# Patient Record
Sex: Male | Born: 1940
Health system: Southern US, Community
[De-identification: ages and names within clinical notes are randomized; demographics above are authoritative.]

## PROBLEM LIST (undated history)

## (undated) DIAGNOSIS — I10 Essential (primary) hypertension: Secondary | ICD-10-CM

## (undated) DIAGNOSIS — I671 Cerebral aneurysm, nonruptured: Secondary | ICD-10-CM

## (undated) DIAGNOSIS — I4891 Unspecified atrial fibrillation: Secondary | ICD-10-CM

## (undated) HISTORY — DX: Unspecified atrial fibrillation: I48.91

## (undated) HISTORY — PX: BRAIN SURGERY: SHX531

## (undated) HISTORY — PX: CARDIAC ELECTROPHYSIOLOGY MAPPING AND ABLATION: SHX1292

## (undated) HISTORY — DX: Essential (primary) hypertension: I10

## (undated) HISTORY — PX: CATARACT EXTRACTION W/ INTRAOCULAR LENS  IMPLANT, BILATERAL: SHX1307

## (undated) HISTORY — PX: CARDIOVERSION: SHX1299

## (undated) HISTORY — PX: LIVER BIOPSY: SHX301

## (undated) HISTORY — PX: TOE SURGERY: SHX1073

## (undated) SURGERY — Surgical Case
Anesthesia: *Unknown

---
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
## 2001-03-17 ENCOUNTER — Encounter (HOSPITAL_COMMUNITY): Admission: RE | Admit: 2001-03-17 | Discharge: 2001-06-15 | Payer: Self-pay | Admitting: Gastroenterology

## 2001-09-07 ENCOUNTER — Encounter (HOSPITAL_COMMUNITY): Admission: RE | Admit: 2001-09-07 | Discharge: 2001-12-06 | Payer: Self-pay | Admitting: Gastroenterology

## 2001-12-08 ENCOUNTER — Encounter (HOSPITAL_COMMUNITY): Admission: RE | Admit: 2001-12-08 | Discharge: 2002-03-08 | Payer: Self-pay | Admitting: Gastroenterology

## 2002-01-31 ENCOUNTER — Encounter: Payer: Self-pay | Admitting: Gastroenterology

## 2002-01-31 ENCOUNTER — Encounter: Admission: RE | Admit: 2002-01-31 | Discharge: 2002-01-31 | Payer: Self-pay | Admitting: Gastroenterology

## 2002-07-06 ENCOUNTER — Ambulatory Visit (HOSPITAL_COMMUNITY): Admission: RE | Admit: 2002-07-06 | Discharge: 2002-07-06 | Payer: Self-pay | Admitting: Gastroenterology

## 2002-10-25 ENCOUNTER — Ambulatory Visit (HOSPITAL_COMMUNITY): Admission: RE | Admit: 2002-10-25 | Discharge: 2002-10-25 | Payer: Self-pay | Admitting: Gastroenterology

## 2003-03-15 ENCOUNTER — Ambulatory Visit (HOSPITAL_COMMUNITY): Admission: RE | Admit: 2003-03-15 | Discharge: 2003-03-15 | Payer: Self-pay | Admitting: Gastroenterology

## 2003-07-19 ENCOUNTER — Encounter (HOSPITAL_COMMUNITY): Admission: RE | Admit: 2003-07-19 | Discharge: 2003-10-17 | Payer: Self-pay | Admitting: *Deleted

## 2004-01-31 ENCOUNTER — Encounter (HOSPITAL_COMMUNITY): Admission: RE | Admit: 2004-01-31 | Discharge: 2004-04-30 | Payer: Self-pay | Admitting: Gastroenterology

## 2004-04-05 HISTORY — PX: ANEURYSM COILING: SHX5349

## 2004-04-17 ENCOUNTER — Ambulatory Visit (HOSPITAL_COMMUNITY): Admission: RE | Admit: 2004-04-17 | Discharge: 2004-04-17 | Payer: Self-pay | Admitting: Cardiology

## 2004-05-28 ENCOUNTER — Ambulatory Visit (HOSPITAL_COMMUNITY): Admission: RE | Admit: 2004-05-28 | Discharge: 2004-05-28 | Payer: Self-pay | Admitting: Cardiology

## 2004-09-28 ENCOUNTER — Ambulatory Visit (HOSPITAL_COMMUNITY): Admission: RE | Admit: 2004-09-28 | Discharge: 2004-09-29 | Payer: Self-pay | Admitting: Gastroenterology

## 2004-12-30 ENCOUNTER — Ambulatory Visit (HOSPITAL_COMMUNITY): Admission: RE | Admit: 2004-12-30 | Discharge: 2004-12-30 | Payer: Self-pay | Admitting: Gastroenterology

## 2005-05-05 ENCOUNTER — Encounter: Admission: RE | Admit: 2005-05-05 | Discharge: 2005-05-05 | Payer: Self-pay | Admitting: Gastroenterology

## 2006-05-18 ENCOUNTER — Encounter: Admission: RE | Admit: 2006-05-18 | Discharge: 2006-05-18 | Payer: Self-pay | Admitting: Gastroenterology

## 2006-05-30 ENCOUNTER — Ambulatory Visit (HOSPITAL_COMMUNITY): Admission: RE | Admit: 2006-05-30 | Discharge: 2006-05-30 | Payer: Self-pay | Admitting: Cardiology

## 2006-07-22 ENCOUNTER — Ambulatory Visit (HOSPITAL_COMMUNITY): Admission: RE | Admit: 2006-07-22 | Discharge: 2006-07-22 | Payer: Self-pay | Admitting: Cardiology

## 2007-10-11 ENCOUNTER — Encounter: Admission: RE | Admit: 2007-10-11 | Discharge: 2007-10-11 | Payer: Self-pay | Admitting: Family Medicine

## 2008-03-14 ENCOUNTER — Ambulatory Visit (HOSPITAL_COMMUNITY): Admission: RE | Admit: 2008-03-14 | Discharge: 2008-03-14 | Payer: Self-pay | Admitting: Cardiology

## 2010-04-26 ENCOUNTER — Encounter: Payer: Self-pay | Admitting: Cardiology

## 2010-06-12 ENCOUNTER — Emergency Department (HOSPITAL_COMMUNITY): Payer: Medicare Other

## 2010-06-12 ENCOUNTER — Emergency Department (HOSPITAL_COMMUNITY)
Admission: EM | Admit: 2010-06-12 | Discharge: 2010-06-12 | Disposition: A | Discharge status: Home / Self Care | Payer: Medicare Other | Attending: Emergency Medicine | Admitting: Emergency Medicine

## 2010-06-12 DIAGNOSIS — R0609 Other forms of dyspnea: Secondary | ICD-10-CM | POA: Insufficient documentation

## 2010-06-12 DIAGNOSIS — I4891 Unspecified atrial fibrillation: Secondary | ICD-10-CM | POA: Insufficient documentation

## 2010-06-12 DIAGNOSIS — I10 Essential (primary) hypertension: Secondary | ICD-10-CM | POA: Insufficient documentation

## 2010-06-12 DIAGNOSIS — R6884 Jaw pain: Secondary | ICD-10-CM | POA: Insufficient documentation

## 2010-06-12 DIAGNOSIS — Z79899 Other long term (current) drug therapy: Secondary | ICD-10-CM | POA: Insufficient documentation

## 2010-06-12 DIAGNOSIS — R079 Chest pain, unspecified: Secondary | ICD-10-CM | POA: Insufficient documentation

## 2010-06-12 DIAGNOSIS — I499 Cardiac arrhythmia, unspecified: Secondary | ICD-10-CM | POA: Insufficient documentation

## 2010-06-12 DIAGNOSIS — M542 Cervicalgia: Secondary | ICD-10-CM | POA: Insufficient documentation

## 2010-06-12 DIAGNOSIS — R0989 Other specified symptoms and signs involving the circulatory and respiratory systems: Secondary | ICD-10-CM | POA: Insufficient documentation

## 2010-06-12 DIAGNOSIS — I2510 Atherosclerotic heart disease of native coronary artery without angina pectoris: Secondary | ICD-10-CM | POA: Insufficient documentation

## 2010-06-12 LAB — DIFFERENTIAL
Basophils Absolute: 0 10*3/uL (ref 0.0–0.1)
Basophils Relative: 0 % (ref 0–1)
Neutro Abs: 3.1 10*3/uL (ref 1.7–7.7)
Neutrophils Relative %: 61 % (ref 43–77)

## 2010-06-12 LAB — BASIC METABOLIC PANEL
Calcium: 9 mg/dL (ref 8.4–10.5)
GFR calc Af Amer: >60 mL/min (ref >60)
GFR calc non Af Amer: >60 mL/min (ref >60)
Glucose, Bld: 142 mg/dL — ABNORMAL HIGH (ref 70–99)
Sodium: 136 mEq/L (ref 135–145)

## 2010-06-12 LAB — CBC
Hemoglobin: 16.4 g/dL (ref 13.0–17.0)
RBC: 5.09 MIL/uL (ref 4.22–5.81)

## 2010-06-12 LAB — CK TOTAL AND CKMB (NOT AT ARMC)
CK, MB: 2.2 ng/mL (ref 0.3–4.0)
Total CK: 111 U/L (ref 7–232)

## 2010-06-12 LAB — APTT: aPTT: 38 seconds — ABNORMAL HIGH (ref 24–37)

## 2010-06-12 LAB — PROTIME-INR
INR: 1.21 (ref 0.00–1.49)
Prothrombin Time: 15.5 seconds — ABNORMAL HIGH (ref 11.6–15.2)

## 2010-07-06 ENCOUNTER — Other Ambulatory Visit: Payer: Self-pay | Admitting: Gastroenterology

## 2010-07-06 DIAGNOSIS — C220 Liver cell carcinoma: Secondary | ICD-10-CM

## 2010-07-14 ENCOUNTER — Ambulatory Visit
Admission: RE | Admit: 2010-07-14 | Discharge: 2010-07-14 | Disposition: A | Discharge status: Home / Self Care | Payer: Medicare Other | Source: Ambulatory Visit | Attending: Gastroenterology | Admitting: Gastroenterology

## 2010-07-14 DIAGNOSIS — C220 Liver cell carcinoma: Secondary | ICD-10-CM

## 2010-08-18 NOTE — Op Note (Signed)
NAME:  HOYTE, ZIEBELL NO.:  [id number]   MEDICAL RECORD NO.:  [id number]          PATIENT TYPE:  OIB   LOCATION:  2899                         FACILITY:  [facility]   PHYSICIAN:  Georga Hacking, M.D.DATE OF BIRTH:  [birth year]   DATE OF PROCEDURE:  DATE OF DISCHARGE:  03/14/2008                               OPERATIVE REPORT   INDICATIONS:  This is a 70 year old male with a history of atrial  tachycardia, which has been recurrent following previous radiofrequency  ablation for atrial fibrillation.   The patient was brought to the Outpatient Short-Stay Unit and had  anesthesia administered by Dr. Jairo Ben with 250 mg of  Pentothal.  Cardioversion was done with synchronized biphasic  defibrillation initially with 50 watts and then with 100 watts of  synchronized cardioversion resulting in reversion to sinus rhythm with a  second shock.   IMPRESSION:  Successful cardioversion of atrial tachycardia.      Georga Hacking, M.D.  Electronically Signed     WST/MEDQ  D:  03/14/2008  T:  03/14/2008  Job:  [id number]   cc:   Teena Irani. Sampson Goon, MD

## 2010-08-21 NOTE — Op Note (Signed)
NAME:  MERRILL, VILLARRUEL NO.:  [id number]   MEDICAL RECORD NO.:  [id number]          PATIENT TYPE:  OIB   LOCATION:  2899                         FACILITY:  [facility]   PHYSICIAN:  Georga Hacking, M.D.DATE OF BIRTH:  [birth year]   DATE OF PROCEDURE:  DATE OF DISCHARGE:  05/30/2006                               OPERATIVE REPORT   PROCEDURE:  Synchronized biphasic defibrillation.   INDICATION:  A 70 year old male with recurrent atrial flutter and  fibrillation.   DESCRIPTION OF PROCEDURE:  The patient was brought to the short stay  unit, was NPO.  After 200 mg of pentothal was administered by Dr. Bedelia Person under anesthetic monitoring, cardioversion was done with biphasic  synchronized defibrillation with 120 watts, resulting in conversion to  sinus rhythm.   IMPRESSION:  Successful cardioversion of atrial flutter and  fibrillation.      Georga Hacking, M.D.  Electronically Signed     WST/MEDQ  D:  05/30/2006  T:  05/31/2006  Job:  [id number]   cc:   Dellis Anes. Idell Pickles, M.D.

## 2010-08-21 NOTE — Op Note (Signed)
NAME:  ROGEN, PORTE NO.:  [id number]   MEDICAL RECORD NO.:  [id number]          PATIENT TYPE:  OIB   LOCATION:  2858                         FACILITY:  [facility]   PHYSICIAN:  Georga Hacking, M.D.DATE OF BIRTH:  Jul 01, 1940   DATE OF PROCEDURE:  07/22/2006  DATE OF DISCHARGE:                               OPERATIVE REPORT   PROCEDURE:  Cardioversion.   HISTORY:  The patient has recurrent atrial fibrillation.  He had a  successful cardioversion in February but reverted back to atrial  fibrillation a week afterwards.  His flecainide was increased and he is  brought in at this time for another attempt at cardioversion.   PROCEDURE:  DC cardioversion.  The patient was n.p.o. after midnight and  was brought to the short-stay center.  Anesthesia was administered by  Dr. Claybon Jabs with 300 mg of Pentothal intravenously.  Cardioversion  was done with synchronized biphasic defibrillation with 75 watts,  resulting in reversion to sinus rhythm after one shock.   IMPRESSION:  Successful cardioversion of atrial fibrillation.      Georga Hacking, M.D.  Electronically Signed     WST/MEDQ  D:  07/22/2006  T:  07/22/2006  Job:  [id number]

## 2010-12-11 ENCOUNTER — Other Ambulatory Visit: Payer: Self-pay | Admitting: Family Medicine

## 2010-12-11 DIAGNOSIS — Z136 Encounter for screening for cardiovascular disorders: Secondary | ICD-10-CM

## 2010-12-17 ENCOUNTER — Ambulatory Visit
Admission: RE | Admit: 2010-12-17 | Discharge: 2010-12-17 | Disposition: A | Discharge status: Home / Self Care | Payer: Medicare Other | Source: Ambulatory Visit | Attending: Family Medicine | Admitting: Family Medicine

## 2010-12-17 DIAGNOSIS — Z136 Encounter for screening for cardiovascular disorders: Secondary | ICD-10-CM

## 2011-02-12 ENCOUNTER — Other Ambulatory Visit: Payer: Self-pay | Admitting: Cardiology

## 2011-02-12 ENCOUNTER — Encounter: Payer: Self-pay | Admitting: Cardiology

## 2011-02-12 ENCOUNTER — Ambulatory Visit
Admission: RE | Admit: 2011-02-12 | Discharge: 2011-02-12 | Disposition: A | Discharge status: Home / Self Care | Payer: Medicare Other | Source: Ambulatory Visit | Attending: Cardiology | Admitting: Cardiology

## 2011-02-12 DIAGNOSIS — M109 Gout, unspecified: Secondary | ICD-10-CM | POA: Insufficient documentation

## 2011-02-12 DIAGNOSIS — I10 Essential (primary) hypertension: Secondary | ICD-10-CM | POA: Insufficient documentation

## 2011-02-12 DIAGNOSIS — Z7901 Long term (current) use of anticoagulants: Secondary | ICD-10-CM | POA: Insufficient documentation

## 2011-02-12 DIAGNOSIS — I4891 Unspecified atrial fibrillation: Secondary | ICD-10-CM

## 2011-02-12 DIAGNOSIS — I671 Cerebral aneurysm, nonruptured: Secondary | ICD-10-CM | POA: Insufficient documentation

## 2011-02-12 MED ORDER — HYDROCORTISONE 1 % EX CREA: 1.0000 "application " | TOPICAL_CREAM | Freq: Three times a day (TID) | CUTANEOUS | Status: DC | PRN

## 2011-02-12 MED ORDER — SODIUM CHLORIDE 0.45 % IV SOLN: INTRAVENOUS | Status: DC

## 2011-02-12 NOTE — H&P (Signed)
Randy Evans  Date of visit:  02/12/2011 DOB:  01-09-1941    Age:  70 yrs. Medical record number:  [id number]     Account number:  [id number] Primary Care Provider: Darrow Bussing ____________________________ CURRENT DIAGNOSES  1. Arrhythmia-Atrial Fibrillation  2. Hypertension-Essential (Benign)  3. Long-term (current) use of anticoagulants  4. Iron Overload Nos  5. Cerebral Aneurysm, Nonruptured ____________________________ ALLERGIES  codeine sulfate  Sulfonamides ____________________________ MEDICATIONS  1. Multi-Day tablet, 1 p.o. daily  2. Acetaminophen 500 mg Tablet, Take as directed  3. Chlor-Trimeton 4 mg Tablet, PRN  4. Ocuvite Tablet, 1 p.o. daily  5. Pradaxa 150 mg Capsule, BID  6. Calcium 600 600 mg (1,500 mg) Tablet, 1 p.o. daily  7. flecainide 150 mg Tablet, BID  8. allopurinol 300 mg Tablet, 1 p.o. q.d.  9. amlodipine 5 mg Tablet, 1 p.o. daily  10. Lanoxin 250 mcg Tablet, 1 p.o. daily  11. metoprolol tartrate 50 mg Tablet, 1 p.o. daily  12. losartan 50 mg Tablet, 1 p.o. daily ____________________________ CHIEF COMPLAINTS  Followup of Arrhythmia-Atrial Fibrillation ____________________________ HISTORY OF PRESENT ILLNESS  Patient seen for evaluation for cardioversion for atrial fibrillation. He has a long-standing history of paroxysmal atrial fibrillation and has had multiple cardioversions over the years. He had an atrial fibrillation ablation in October of 2009 and a redo ablation in April 2010 by Dr. Sampson Goon at [facility]. He had some recurrence afterwards and has been maintained on flecainide. He has been switched to Pradaxa for anticoagulation. His blood pressure is been well controlled. He recently noted some worsening fatigue and has had persistent atrial fibrillation now for about 3 weeks. He was seen in the office about 10 days ago and since the atrial fibrillation has persisted is brought in at this time to consider cardioversion. He denies angina  and has no PND, orthopnea or edema. He does complain of moderate fatigue and symptoms from the atrial fibrillation.  ____________________________ PAST HISTORY  Past Medical Illnesses:  hypertension, elevated liver enzymes with possible hemachromatosis, history of cerebral aneurysm treated with coil embolization at [facility], gout;  Cardiovascular Illnesses:  atrial fibrillation-paroxysmal;  Surgical Procedures:  liver biopsy, toe surgery, tumor left leg removed;  Cardiology Procedures-Invasive:  cardiac cath (left) May 1991, cardioversion February 2008, April 2008, Deceber 2009, [place] ablation for atrial fibrillation October 2009 and redo 07/2008;  Cardiology Procedures-Noninvasive:  echocardiogram January 2008, history of cardioversions, treadmill cardiolite April 2012;  Cardiac Cath Results:  normal LVSF, normal coronary arteries;  Peripheral Vascular Procedures:  embolization of cerebral artery 5/06 at [facility];  LVEF of 60% documented via nuclear study on 07/09/2010 ____________________________ CARDIO-PULMONARY TEST DATES EKG Date:  02/12/2011;   Cardiac Cath Date:  09/01/1999;  Nuclear Study Date:  07/09/2010;  is  Echocardiography Date: 04/22/2006;  Chest Xray Date: 05/30/2006;   ____________________________ SOCIAL HISTORY Alcohol Use:  drinks occasionally;  Smoking:  used to smoke but quit;  Diet:  [employer];  Lifestyle:  married;  Education:  college degree and [employer];  Exercise:  exercises regularly;  Occupation:  retired;  Residence:  lives with wife;  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  Cardiovascular:  please review HPI  Genitourinary-Male:  no dysuria, urgency, frequency, or nocturia  Musculoskeletal:  arthritis of the fingers  Neurological:  denies headaches, stroke, or  TIA ____________________________ PHYSICAL  EXAMINATION VITAL SIGNS  Blood Pressure:  110/70 Sitting, Right arm, regular cuff  , 114/70 Standing, Right arm and regular cuff   Pulse:  70/min. Weight:  193.00 lbs. Height:  69"BMI: 28  Constitutional:  pleasant white male in no acute distress Skin:  warm and dry to touch, no apparent skin lesions, or masses noted. Head:  normocephalic, balding male hair pattern ENT:  ears, nose and throat reveal no gross abnormalities.  No pallor or cyanosis.  Dentition good. Neck:  supple, no masses, thyromegaly, JVD. Carotid pulses are full and equal bilaterally without bruits. Chest:  normal symmetry, clear to auscultation and percussion. Cardiac:  irregular rhythm, normal S1 and S2, no S3 or S4, no murmurs,clicks, or rub heard Peripheral Pulses:  the femoral,dorsalis pedis, and posterior tibial pulses are full and equal bilaterally with no bruits auscultated. Extremities & Back:  no deformities, clubbing, cyanosis, erythema or edema observed. Normal muscle strength and tone. Neurological:  no motor or sensory deficits noted, affect appropriate, oriented x3. ____________________________ MOST RECENT LIPID PANEL 01/05/10  CHOL TOTL 164 mg/dl, LDL 086 Calc , HDL 28 mg/dl, TRIGLYCER 578 mg/dl, CHOL/HDL 4.69 (Calc ____________________________ IMPRESSIONS/PLAN  1. Persistent atrial fibrillation and atrial tachycardia which is symptomatic 2. Long-term anticoagulation with Pradaxa 3. Hypertension controlled  Recommendations:  Patient is brought in at this time for elective cardioversion. The procedure was discussed with him fully including risks and he is agreeable.  ____________________________ TODAYS ORDERS  1. 12 Lead EKG: Today  2. Comprehensive Metabolic Panel: Today  3. Complete Blood Count: Today  4. CHEST XRAY: Today                       ____________________________ Cardiology Physician:  Darden Palmer MD [facility]

## 2011-02-15 ENCOUNTER — Other Ambulatory Visit: Payer: Self-pay

## 2011-02-15 ENCOUNTER — Encounter (HOSPITAL_COMMUNITY): Payer: Self-pay

## 2011-02-15 ENCOUNTER — Ambulatory Visit (HOSPITAL_COMMUNITY): Payer: Medicare Other

## 2011-02-15 ENCOUNTER — Inpatient Hospital Stay (HOSPITAL_BASED_OUTPATIENT_CLINIC_OR_DEPARTMENT_OTHER): Admit: 2011-02-15 | Payer: Self-pay | Admitting: Cardiology

## 2011-02-15 ENCOUNTER — Ambulatory Visit (HOSPITAL_COMMUNITY)
Admission: RE | Admit: 2011-02-15 | Discharge: 2011-02-15 | Disposition: A | Discharge status: Home / Self Care | Payer: Medicare Other | Source: Ambulatory Visit | Attending: Cardiology | Admitting: Cardiology

## 2011-02-15 ENCOUNTER — Encounter (HOSPITAL_BASED_OUTPATIENT_CLINIC_OR_DEPARTMENT_OTHER): Payer: Self-pay

## 2011-02-15 ENCOUNTER — Encounter (HOSPITAL_COMMUNITY)
Admission: RE | Disposition: A | Discharge status: Home / Self Care | Payer: Self-pay | Source: Ambulatory Visit | Attending: Cardiology

## 2011-02-15 DIAGNOSIS — Z01810 Encounter for preprocedural cardiovascular examination: Secondary | ICD-10-CM | POA: Insufficient documentation

## 2011-02-15 DIAGNOSIS — I671 Cerebral aneurysm, nonruptured: Secondary | ICD-10-CM | POA: Insufficient documentation

## 2011-02-15 DIAGNOSIS — Z7901 Long term (current) use of anticoagulants: Secondary | ICD-10-CM | POA: Insufficient documentation

## 2011-02-15 DIAGNOSIS — I4891 Unspecified atrial fibrillation: Secondary | ICD-10-CM | POA: Insufficient documentation

## 2011-02-15 DIAGNOSIS — I10 Essential (primary) hypertension: Secondary | ICD-10-CM | POA: Insufficient documentation

## 2011-02-15 HISTORY — DX: Cerebral aneurysm, nonruptured: I67.1

## 2011-02-15 HISTORY — DX: Hemochromatosis, unspecified: E83.119

## 2011-02-15 SURGERY — JV LEFT HEART CATHETERIZATION WITH CORONARY ANGIOGRAM
Anesthesia: Moderate Sedation

## 2011-02-15 SURGERY — Surgical Case
Anesthesia: *Unknown

## 2011-02-15 SURGERY — CARDIOVERSION
Anesthesia: Monitor Anesthesia Care | Wound class: Clean

## 2011-02-15 MED ORDER — HYDROCORTISONE 1 % EX CREA
1.0000 "application " | TOPICAL_CREAM | Freq: Three times a day (TID) | CUTANEOUS | Status: DC | PRN
  Administered 2011-02-15: 1 via TOPICAL
  Filled 2011-02-15 (×2): qty 28

## 2011-02-15 MED ORDER — SODIUM CHLORIDE 0.45 % IV SOLN
INTRAVENOUS | Status: DC
  Administered 2011-02-15: 13:00:00 via INTRAVENOUS

## 2011-02-15 MED ORDER — DABIGATRAN ETEXILATE MESYLATE 150 MG PO CAPS: 150.0000 mg | ORAL_CAPSULE | Freq: Two times a day (BID) | ORAL | Status: DC

## 2011-02-15 MED ORDER — FLECAINIDE ACETATE 50 MG PO TABS: 150.0000 mg | ORAL_TABLET | Freq: Two times a day (BID) | ORAL | Status: DC

## 2011-02-15 MED ORDER — OCUVITE-LUTEIN PO CAPS: 1.0000 | ORAL_CAPSULE | Freq: Every day | ORAL | Status: DC

## 2011-02-15 MED ORDER — LOSARTAN POTASSIUM 50 MG PO TABS: 50.0000 mg | ORAL_TABLET | Freq: Every day | ORAL | Status: DC

## 2011-02-15 MED ORDER — CALCIUM CITRATE-VITAMIN D 200-200 MG-UNIT PO TABS: 1.0000 | ORAL_TABLET | Freq: Every day | ORAL | Status: DC

## 2011-02-15 MED ORDER — OMEGA-3-ACID ETHYL ESTERS 1 G PO CAPS: 1.0000 g | ORAL_CAPSULE | Freq: Two times a day (BID) | ORAL | Status: DC

## 2011-02-15 MED ORDER — PROPOFOL 10 MG/ML IV EMUL
INTRAVENOUS | Status: DC | PRN
  Administered 2011-02-15: 130 mg via INTRAVENOUS

## 2011-02-15 MED ORDER — SODIUM CHLORIDE 0.9 % IJ SOLN: 3.0000 mL | INTRAMUSCULAR | Status: DC | PRN

## 2011-02-15 MED ORDER — METOPROLOL TARTRATE 50 MG PO TABS: 50.0000 mg | ORAL_TABLET | Freq: Two times a day (BID) | ORAL | Status: DC

## 2011-02-15 MED ORDER — THERA M PLUS PO TABS: 1.0000 | ORAL_TABLET | Freq: Every day | ORAL | Status: DC

## 2011-02-15 MED ORDER — SODIUM CHLORIDE 0.9 % IV SOLN
INTRAVENOUS | Status: DC | PRN
  Administered 2011-02-15: 13:00:00 via INTRAVENOUS

## 2011-02-15 MED ORDER — ACETAMINOPHEN 500 MG PO TABS: 1000.0000 mg | ORAL_TABLET | Freq: Four times a day (QID) | ORAL | Status: DC | PRN

## 2011-02-15 MED ORDER — AMLODIPINE BESYLATE 5 MG PO TABS: 5.0000 mg | ORAL_TABLET | Freq: Every day | ORAL | Status: DC

## 2011-02-15 MED ORDER — ALLOPURINOL 300 MG PO TABS: 300.0000 mg | ORAL_TABLET | Freq: Every day | ORAL | Status: DC

## 2011-02-15 NOTE — Procedures (Signed)
Electrical Cardioversion Procedure Note JAYTEN GABBARD [id number] [birth year]  Procedure: Electrical Cardioversion Indications:  Recurrent atrial tachycardia  Time Out: Verified patient identification, verified procedure,medications/allergies/relevent history reviewed, required imaging and test results available.  Performed  Procedure Details  The patient was NPO after midnight. Anesthesia was administered at the beside  by Dr.Hodierne with 130 mg of propofol.  Cardioversion was done with synchronized biphasic defibrillation with AP pads with 100 watts and 120 watts.  The patient converted to normal sinus rhythm after the initial shock, but then reverted back to atrial tachycardia with block.  A second shock of 120 watts was then administered with transient conversion to sinus, but then reversion to atrial tachycardia. Since he failed to hold sinus, I elected to not attempt further cardioversions.  IMPRESSION:  Unsuccessful cardioversion of atrial tachycardia  PLAN:  Reconsult EP.  Consider change to a different agent ie Dofetilide or repeat ablation of a septal tachycardia.      Darden Palmer. MD [facility] 02/15/2011, 1:31 PM

## 2011-02-15 NOTE — H&P (Signed)
   Patient seen and examined.  No interval change in history and exam since last note 02/12/11.  Stable for procedure.  Darden Palmer. MD [facility]  02/15/2011

## 2011-02-15 NOTE — Anesthesia Preprocedure Evaluation (Addendum)
Anesthesia Evaluation  Patient identified by MRN, date of birth, ID band Patient awake    Reviewed: Allergy & Precautions, H&P , NPO status , Patient's Chart, lab work & pertinent test results, reviewed documented beta blocker date and time   Airway Mallampati: II TM Distance: >3 FB Neck ROM: full    Dental  (+) Teeth Intact   Pulmonary neg pulmonary ROS,          Cardiovascular hypertension, Atrial Fibrillation irregular     Neuro/Psych Negative Neurological ROS  Negative Psych ROS   GI/Hepatic negative GI ROS, Neg liver ROS,   Endo/Other  Negative Endocrine ROS  Renal/GU negative Renal ROS  Genitourinary negative   Musculoskeletal negative musculoskeletal ROS (+)   Abdominal   Peds  Hematology negative hematology ROS (+)   Anesthesia Other Findings Gout  Reproductive/Obstetrics                          Anesthesia Physical Anesthesia Plan  ASA: III  Anesthesia Plan: MAC   Post-op Pain Management:    Induction: Intravenous  Airway Management Planned: Mask  Additional Equipment:   Intra-op Plan:   Post-operative Plan:   Informed Consent: I have reviewed the patients History and Physical, chart, labs and discussed the procedure including the risks, benefits and alternatives for the proposed anesthesia with the patient or authorized representative who has indicated his/her understanding and acceptance.     Plan Discussed with: Anesthesiologist, CRNA and Surgeon  Anesthesia Plan Comments:        Anesthesia Quick Evaluation

## 2011-02-15 NOTE — Progress Notes (Signed)
113/68 B/P P 58 SAT 97

## 2011-02-15 NOTE — Preoperative (Signed)
Beta Blockers   Reason not to administer Beta Blockers:Not Applicable 

## 2011-02-15 NOTE — Brief Op Note (Signed)
02/15/2011  1:29 PM  PATIENT:  Randy Evans  70 y.o. male  PRE-OPERATIVE DIAGNOSIS:  afib  POST-OPERATIVE DIAGNOSIS: afib PROCEDURE:  Procedure(s): CARDIOVERSION done without complications.  Unable to convert back to sinus.  SURGEON:  Surgeon(s): Darden Palmer., MD

## 2011-02-15 NOTE — Progress Notes (Signed)
DR Donnie Aho NOTIFIED OF MONITOR SHOWING PAUSES OF ~3-4 SEC AND

## 2011-02-15 NOTE — Anesthesia Postprocedure Evaluation (Signed)
Anesthesia Post Note  Patient: Randy Evans  Procedure(s) Performed:  CARDIOVERSION  Anesthesia type: MAC  Patient location: PACU  Post pain: Pain level controlled and Adequate analgesia  Post assessment: Post-op Vital signs reviewed, Patient's Cardiovascular Status Stable, Respiratory Function Stable, Patent Airway and Pain level controlled  Last Vitals:  Filed Vitals:   02/15/11 1323  BP: 121/73  Pulse: 65  Temp:   Resp: 11    Post vital signs: Reviewed and stable  Level of consciousness: awake, alert  and oriented  Complications: No apparent anesthesia complications

## 2011-02-15 NOTE — Progress Notes (Signed)
112/75B/P P-60 SAT 98

## 2011-02-15 NOTE — Transfer of Care (Signed)
Immediate Anesthesia Transfer of Care Note  Patient: Randy Evans  Procedure(s) Performed:  CARDIOVERSION  Patient Location: PACU and Short Stay  Anesthesia Type: MAC  Level of Consciousness: awake, alert  and oriented  Airway & Oxygen Therapy: Patient connected to nasal cannula oxygen  Post-op Assessment: Report given to PACU RN  Post vital signs: Reviewed and stable  Complications: No apparent anesthesia complications

## 2011-02-18 ENCOUNTER — Encounter (HOSPITAL_COMMUNITY): Payer: Self-pay | Admitting: Cardiology

## 2011-02-19 ENCOUNTER — Encounter (HOSPITAL_COMMUNITY): Payer: Self-pay | Admitting: Cardiology

## 2011-03-16 ENCOUNTER — Other Ambulatory Visit: Payer: Self-pay

## 2011-03-16 ENCOUNTER — Inpatient Hospital Stay (HOSPITAL_COMMUNITY)
Admission: AD | Admit: 2011-03-16 | Discharge: 2011-03-19 | DRG: 310 | Disposition: A | Discharge status: Home / Self Care | Payer: Medicare Other | Source: Ambulatory Visit | Attending: Cardiology | Admitting: Cardiology

## 2011-03-16 ENCOUNTER — Encounter (HOSPITAL_COMMUNITY): Payer: Self-pay | Admitting: General Practice

## 2011-03-16 DIAGNOSIS — Z7901 Long term (current) use of anticoagulants: Secondary | ICD-10-CM

## 2011-03-16 DIAGNOSIS — I4891 Unspecified atrial fibrillation: Secondary | ICD-10-CM | POA: Diagnosis present

## 2011-03-16 DIAGNOSIS — I498 Other specified cardiac arrhythmias: Principal | ICD-10-CM | POA: Diagnosis present

## 2011-03-16 DIAGNOSIS — I119 Hypertensive heart disease without heart failure: Secondary | ICD-10-CM | POA: Diagnosis present

## 2011-03-16 DIAGNOSIS — E876 Hypokalemia: Secondary | ICD-10-CM | POA: Diagnosis present

## 2011-03-16 LAB — CBC
MCV: 93.8 fL (ref 78.0–100.0)
Platelets: 160 10*3/uL (ref 150–400)
RBC: 4.83 MIL/uL (ref 4.22–5.81)
WBC: 6.1 10*3/uL (ref 4.0–10.5)

## 2011-03-16 LAB — COMPREHENSIVE METABOLIC PANEL
Albumin: 3.8 g/dL (ref 3.5–5.2)
BUN: 18 mg/dL (ref 6–23)
Calcium: 8.7 mg/dL (ref 8.4–10.5)
Chloride: 103 mEq/L (ref 96–112)
Creatinine, Ser: 0.76 mg/dL (ref 0.50–1.35)
Total Bilirubin: 0.6 mg/dL (ref 0.3–1.2)

## 2011-03-16 LAB — DIFFERENTIAL
Eosinophils Relative: 2 % (ref 0–5)
Lymphocytes Relative: 29 % (ref 12–46)
Lymphs Abs: 1.8 10*3/uL (ref 0.7–4.0)
Neutrophils Relative %: 57 % (ref 43–77)

## 2011-03-16 MED ORDER — SODIUM CHLORIDE 0.9 % IV SOLN: 250.0000 mL | INTRAVENOUS | Status: DC | PRN

## 2011-03-16 MED ORDER — ALLOPURINOL 300 MG PO TABS
300.0000 mg | ORAL_TABLET | Freq: Every day | ORAL | Status: DC
  Administered 2011-03-17 – 2011-03-19 (×3): 300 mg via ORAL
  Filled 2011-03-16 (×3): qty 1

## 2011-03-16 MED ORDER — AMLODIPINE BESYLATE 5 MG PO TABS
5.0000 mg | ORAL_TABLET | Freq: Every day | ORAL | Status: DC
  Administered 2011-03-17 – 2011-03-19 (×3): 5 mg via ORAL
  Filled 2011-03-16 (×3): qty 1

## 2011-03-16 MED ORDER — DABIGATRAN ETEXILATE MESYLATE 150 MG PO CAPS
150.0000 mg | ORAL_CAPSULE | Freq: Two times a day (BID) | ORAL | Status: DC
  Administered 2011-03-17 – 2011-03-19 (×5): 150 mg via ORAL
  Filled 2011-03-16 (×7): qty 1

## 2011-03-16 MED ORDER — DOFETILIDE 500 MCG PO CAPS
500.0000 ug | ORAL_CAPSULE | Freq: Two times a day (BID) | ORAL | Status: DC
  Administered 2011-03-16 – 2011-03-17 (×2): 500 ug via ORAL
  Filled 2011-03-16 (×3): qty 1

## 2011-03-16 MED ORDER — THERA M PLUS PO TABS
1.0000 | ORAL_TABLET | Freq: Every day | ORAL | Status: DC
  Administered 2011-03-17 – 2011-03-19 (×3): 1 via ORAL
  Filled 2011-03-16 (×3): qty 1

## 2011-03-16 MED ORDER — DOFETILIDE 500 MCG PO CAPS
500.0000 ug | ORAL_CAPSULE | Freq: Two times a day (BID) | ORAL | Status: DC
  Filled 2011-03-16: qty 1

## 2011-03-16 MED ORDER — OMEGA-3-ACID ETHYL ESTERS 1 G PO CAPS
1.0000 g | ORAL_CAPSULE | Freq: Two times a day (BID) | ORAL | Status: DC
  Administered 2011-03-16 – 2011-03-19 (×6): 1 g via ORAL
  Filled 2011-03-16 (×7): qty 1

## 2011-03-16 MED ORDER — SODIUM CHLORIDE 0.9 % IJ SOLN: 3.0000 mL | INTRAMUSCULAR | Status: DC | PRN

## 2011-03-16 MED ORDER — OCUVITE-LUTEIN PO CAPS: 1.0000 | ORAL_CAPSULE | Freq: Every day | ORAL | Status: DC

## 2011-03-16 MED ORDER — METOPROLOL TARTRATE 50 MG PO TABS
50.0000 mg | ORAL_TABLET | Freq: Two times a day (BID) | ORAL | Status: DC
  Administered 2011-03-17: 50 mg via ORAL
  Filled 2011-03-16 (×5): qty 1

## 2011-03-16 MED ORDER — SODIUM CHLORIDE 0.9 % IJ SOLN
3.0000 mL | Freq: Two times a day (BID) | INTRAMUSCULAR | Status: DC
  Administered 2011-03-16 – 2011-03-19 (×6): 3 mL via INTRAVENOUS

## 2011-03-16 MED ORDER — PROSIGHT PO TABS
1.0000 | ORAL_TABLET | Freq: Every day | ORAL | Status: DC
  Administered 2011-03-17 – 2011-03-19 (×3): 1 via ORAL
  Filled 2011-03-16 (×4): qty 1

## 2011-03-16 MED ORDER — LOSARTAN POTASSIUM 50 MG PO TABS
50.0000 mg | ORAL_TABLET | Freq: Every day | ORAL | Status: DC
Start: 2011-03-17 — End: 2011-03-19
  Administered 2011-03-17 – 2011-03-19 (×3): 50 mg via ORAL
  Filled 2011-03-16 (×3): qty 1

## 2011-03-16 NOTE — Progress Notes (Signed)
Patient EKG reviewed.  QT is around .40 not .44   (confirmed with Dr. Johney Frame of EP)  The atrial tachycardia makes calculation difficult but is not prolonged.  OK to begin Tikosyn.  Mg and K are OK.   Darden Palmer MD

## 2011-03-16 NOTE — H&P (Signed)
Neysa Hotter  Date of visit:  12/112012 DOB:  March 23, 1941    Age:  70 yrs. Medical record number:  [id number]  Primary Care Provider: Darrow Bussing ____________________________ CURRENT DIAGNOSES  1. Arrhythmia-Atrial Fibrillation  2. Hypertension-Essential (Benign)  3. Long-term (current) use of anticoagulants  4. Iron Overload Nos  5. Cerebral Aneurysm, Nonruptured ____________________________ ALLERGIES  codeine sulfate  Sulfonamides ____________________________ MEDICATIONS  1. Multi-Day tablet, 1 p.o. daily  2. Acetaminophen 500 mg Tablet, Take as directed  3. Chlor-Trimeton 4 mg Tablet, PRN  4. Ocuvite Tablet, 1 p.o. daily  5. Pradaxa 150 mg Capsule, BID  6. Calcium 600 600 mg (1,500 mg) Tablet, 1 p.o. daily  7. losartan 50 mg Tablet, 1 p.o. daily  8. allopurinol 300 mg Tablet, 1 p.o. q.d.  9. amlodipine 5 mg Tablet, 1 p.o. daily  10. metoprolol tartrate 50 mg Tablet, 1 p.o. daily  ____________________________ HISTORY OF PRESENT ILLNESS  Patient admitted to the hospital for initiation of antiarrhythmic therapy. He has a long-standing history of atrial fibrillation and has had 2 catheter ablations, the last in April of 2010. At the time he had a septal tachycardia that was not able to be ablated and was cardioverted once. He had recurrence of fatigue  He underwent cardioversion and reverted back to an atrial septal tachycardia. This evidently had been seen previously when he underwent his ablation. He continues to feel fatigued. I spoke with Dr. Johney Frame about this and we discussed that may be the next step would be to wash off of flecainide and then to start Tikosyn in the hospital under observation. His last dose of flecainide was last Wednesday night and he has been off of flecainide now for 6 days. ___________________________ PAST HISTORY  Past Medical Illnesses:  hypertension, elevated liver enzymes with possible hemachromatosis, history of cerebral aneurysm treated with  coil embolization at [facility], gout;  Cardiovascular Illnesses:  atrial fibrillation-paroxysmal;  Surgical Procedures:  liver biopsy, toe surgery, tumor left leg removed;  Cardiology Procedures-Invasive:  cardiac cath (left) May 1991, cardioversion February 2008, April 2008, Deceber 2009, [place] ablation for atrial fibrillation October 2009 and redo 07/2008, cardioversion November 2012;  Cardiology Procedures-Noninvasive:  echocardiogram January 2008, history of cardioversions, treadmill cardiolite April 2012, echocardiogram November 2012;  Cardiac Cath Results:  normal LVSF, normal coronary arteries;  Peripheral Vascular Procedures:  embolization of cerebral artery 5/06 at [facility];  LVEF of 60% documented via nuclear study on 07/09/2010 ____________________________ CARDIO-PULMONARY TEST DATES EKG Date:  02/22/2011;   Cardiac Cath Date:  09/01/1999;  Nuclear Study Date:  07/09/2010;  Echocardiography Date: 03/03/2011;  Chest Xray Date: 02/12/2011;   ____________________________ SOCIAL HISTORY Alcohol Use:  drinks occasionally;  Smoking:  used to smoke but quit;  Diet:  [employer];  Lifestyle:  married;  Education:  college degree and [employer];  Exercise:  exercises regularly;  Occupation:  retired;  Residence:  lives with wife;  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[place];   ____________________________ REVIEW OF SYSTEMS General:  malaise and fatigue Eyes:  wears eye glasses/contact lenses, cataract extraction bilaterally  Respiratory:  recent upper respiratory infection  Cardiovascular:  please review HPI  Abdominal:  negativeGenitourinary-Male:  no dysuria, urgency, frequency, or nocturia  Musculoskeletal:  arthritis of the fingers  Neurological:  denies headaches, stroke, or TIA ____________________________ PHYSICAL EXAMINATION VITAL SIGNS  Blood Pressure:  110/80 Sitting, Left arm, regular cuff  , 110/84 Standing, Left arm and regular cuff   Pulse:  88/min. Weight:  195 lbs. Height:  69"BMI:  28.79  Constitutional:  pleasant white male in no acute distress Skin:  warm and dry to touch, no apparent skin lesions, or masses noted. Head:  normocephalic, balding male hair pattern ENT:  ears, nose and throat reveal no gross abnormalities.  No pallor or cyanosis.  Dentition good. Neck:  supple, no masses, thyromegaly, JVD. Carotid pulses are full and equal bilaterally without bruits. Chest:  normal symmetry, clear to auscultation and percussion. Cardiac:  irregular rhythm, normal S1 and S2, no S3 or S4, no murmurs,clicks, or rub heard Peripheral Pulses:  the femoral,dorsalis pedis, and posterior tibial pulses are full and equal bilaterally with no bruits auscultated. Extremities & Back:  no deformities, clubbing, cyanosis, erythema or edema observed. Normal muscle strength and tone. Neurological:  no motor or sensory deficits noted, affect appropriate, oriented x3. ____________________________ MOST RECENT LIPID PANEL 01/05/10  CHOL TOTL 164 mg/dl, LDL 161 NM, HDL 28 mg/dl, TRIGLYCER 096 mg/dl, ALT 69 u/l, ALK PHOS 89 u/l, CHOL/HDL 5.95 (Calc) and AST 55 u/l ____________________________ IMPRESSIONS/PLAN  1. Recurrent atrial tachycardia that has been resistant to ablation 2. Hypertension controlled 3. Long-term anticoagulation with Pradaxa 4. History of hemachromatosis 5. History of cerebral aneurysm treated with coil ablation  Recommendations:  He will be initiated on Tikosyn after his laboratory studies have been obtained.  ____________________________ Cardiology Physician:  Darden Palmer MD [facility]

## 2011-03-17 ENCOUNTER — Other Ambulatory Visit: Payer: Self-pay

## 2011-03-17 LAB — BASIC METABOLIC PANEL
CO2: 23 mEq/L (ref 19–32)
Calcium: 8.5 mg/dL (ref 8.4–10.5)
Chloride: 102 mEq/L (ref 96–112)
Glucose, Bld: 110 mg/dL — ABNORMAL HIGH (ref 70–99)
Potassium: 3.8 mEq/L (ref 3.5–5.1)
Sodium: 138 mEq/L (ref 135–145)

## 2011-03-17 MED ORDER — DOFETILIDE 500 MCG PO CAPS
500.0000 ug | ORAL_CAPSULE | ORAL | Status: DC
  Administered 2011-03-17 – 2011-03-19 (×4): 500 ug via ORAL
  Filled 2011-03-17 (×6): qty 1

## 2011-03-17 MED ORDER — POTASSIUM CHLORIDE 20 MEQ/15ML (10%) PO LIQD
40.0000 meq | Freq: Once | ORAL | Status: AC
  Administered 2011-03-17: 40 meq via ORAL
  Filled 2011-03-17: qty 30

## 2011-03-17 NOTE — Progress Notes (Signed)
Pt admitted in  with afib for tikosyn load. Plan for home on tikosyn. CM did do a benefits check for pt and  will relay information to pt when available. MD please write  rx for 7 day supply of Tikosyn no refills and original rx with refills. Thanks. CM did speak to pt and he uses [facility] [address]. CM did call the pharmacy and the pharmacy tech will call CM to make sure that they can order. Benefits check in process.  RN @ d/c please call CM for assistance with getting the 7 day Rx filled by main pharmacy. Will continue to monitor. Thanks Gala Lewandowsky, [place] [phone].

## 2011-03-17 NOTE — Progress Notes (Signed)
CM did receive benefits check for Tikosyn: PATIENT HAS A $0 CO-PAY UNTIL 04/06/2011. AFTER 04/06/2011 PATIENT WILL HAVE APPROX. $71.96 CO-PAY AT RETAIL PHARMACY FOR 30 DAY SUPPLY. PATIENT CAN USE ANY MAJOR RETAIL PHARMACY. MAIL ORDER FOR 90 DAY SUPPLY IS APPROX. $80.00.Graves-Bigelow, Keanen Dohse Kaye,RN,BSN,CM [phone].    

## 2011-03-17 NOTE — Progress Notes (Signed)
Subjective:  He is feeling well and tolerated Tikosyn well. He converted to sinus rhythm last night at 11:30 and feels well today. Denies shortness of breath or chest pain.  Objective:  Vital Signs in the last 24 hours: BP 153/83  Pulse 67  Temp(Src) 97.6 F (36.4 C) (Oral)  Resp 18  Ht 5\' 10"  (1.778 m)  Wt 87.181 kg (192 lb 3.2 oz)  BMI 27.58 kg/m2  SpO2 97%  Physical Exam: Pleasant male in no acute distress Lungs:  Clear to A&P Cardiac:  Regular rhythm, normal S1 and S2, no S3 Extremities:  No edema present  Intake/Output from previous day: 12/11 0701 - 12/12 0700 In: 480 [P.O.:480] Out: -   Lab Results: Basic Metabolic Panel:  [facility] 03/17/11 0620 03/16/11 1553  NA 138 139  K 3.8 4.1  CL 102 103  CO2 23 27  GLUCOSE 110* 84  BUN 15 18  CREATININE 0.71 0.76    CBC:  Basename 03/16/11 1553  WBC 6.1  NEUTROABS 3.4  HGB 15.7  HCT 45.3  MCV 93.8  PLT 160   EKG: Normal sinus rhythm. QTC 0.48  Telemetry: Reviewed : Normal sinus rhythm at the present time. Occasional PVCs  Assessment/Plan:  1. Atrial tachycardia which was converted to sinus rhythm with first dose of Tikosyn 2. Long-term anticoagulation with Robaxin a 3. Mild hypokalemia  Recommendations:   replace potassium. Continue Tikosyn. I would like to change him to a 7 AM to 7PM schedule so dosing adjustments made.   Darden Palmer.  MD [facility] 03/17/2011, 1:23 PM

## 2011-03-18 LAB — BASIC METABOLIC PANEL
Chloride: 104 mEq/L (ref 96–112)
GFR calc Af Amer: >90 mL/min (ref >90)
GFR calc non Af Amer: 89 mL/min — ABNORMAL LOW (ref >90)
Glucose, Bld: 114 mg/dL — ABNORMAL HIGH (ref 70–99)
Potassium: 4.9 mEq/L (ref 3.5–5.1)
Sodium: 138 mEq/L (ref 135–145)

## 2011-03-18 MED ORDER — METOPROLOL TARTRATE 25 MG PO TABS
25.0000 mg | ORAL_TABLET | Freq: Two times a day (BID) | ORAL | Status: DC
  Administered 2011-03-18 – 2011-03-19 (×3): 25 mg via ORAL
  Filled 2011-03-18 (×5): qty 1

## 2011-03-18 NOTE — Progress Notes (Addendum)
Subjective:  Remains in NSR. Tolerating Tikosyn well. Denies shortness of breath or chest pain.  Objective:  Vital Signs in the last 24 hours: BP 130/75  Pulse 61  Temp(Src) 97.6 F (36.4 C) (Oral)  Resp 16  Ht 5\' 10"  (1.778 m)  Wt 87 kg (191 lb 12.8 oz)  BMI 27.52 kg/m2  SpO2 97%  Physical Exam: Pleasant male in no acute distress Lungs:  Clear to A&P Cardiac:  Regular rhythm, normal S1 and S2, no S3 Extremities:  No edema present  Intake/Output from previous day: 12/12 0701 - 12/13 0700 In: 1440 [P.O.:1440] Out: -   Lab Results: Basic Metabolic Panel:  [facility] 03/18/11 0526 03/17/11 0620  NA 138 138  K 4.9 3.8  CL 104 102  CO2 28 23  GLUCOSE 114* 110*  BUN 15 15  CREATININE 0.81 0.71    CBC:  Basename 03/16/11 1553  WBC 6.1  NEUTROABS 3.4  HGB 15.7  HCT 45.3  MCV 93.8  PLT 160   EKG: Normal sinus rhythm. QTC 0.46  Telemetry: Reviewed : Normal sinus rhythm at the present time. Occasional PVCs  Assessment/Plan:  1. Atrial tachycardia which was converted to sinus rhythm with first dose of Tikosyn 2. Long-term anticoagulation with Robaxin a  Recommendations:  Continue Tikosyn load.  Home in am if stable.  Reduce metoprolol to 25 BID.   Darden Palmer.  MD [facility] 03/18/2011, 8:58 AM

## 2011-03-19 LAB — BASIC METABOLIC PANEL
BUN: 13 mg/dL (ref 6–23)
CO2: 29 mEq/L (ref 19–32)
Chloride: 104 mEq/L (ref 96–112)
GFR calc Af Amer: >90 mL/min (ref >90)
Potassium: 4.2 mEq/L (ref 3.5–5.1)

## 2011-03-19 MED ORDER — DOFETILIDE 500 MCG PO CAPS: 500.0000 ug | ORAL_CAPSULE | Freq: Two times a day (BID) | ORAL | Status: DC

## 2011-03-19 MED ORDER — METOPROLOL TARTRATE 50 MG PO TABS: 25.0000 mg | ORAL_TABLET | Freq: Two times a day (BID) | ORAL | Status: DC

## 2011-03-19 NOTE — Discharge Summary (Signed)
Patient ID:  Randy Evans  MRN: [id number]  DOB/AGE: 10-17-1940 70 y.o.  Admit date: 03/16/2011   Discharge date: 03/19/2011   Primary Discharge Diagnosis:  1. Atrial tachycardia resolved with administration ofTikosyn   Secondary Discharge Diagnosis:  2. Hypertensive heart disease  3. Long-term anticoagulation treated with Pradaxa  4. History of hemachromatosis  5. History of cerebral aneurysm treated with coil ablation  6. History of recurrent atrial fibrillation treated with ablation twice  7. History of gout   Hospital Course:  The patient was admitted to the hospital to initiate Tikosyn for recurrent atrial septal tachycardia following ablation. He was admitted to the hospital and laboratory work was done in Tikosyn was initiated without difficulty. He converted to sinus rhythm following the first dose of Tikosyn and his QTC did not show significant QT prolongation.Marland Kitchen He was ambulatory in the hall and had no recurrent arrhythmias and is discharged home in improved condition. He was given patient education about Tikosyn.   Discharge Exam:  Blood pressure 150/89, pulse 59, temperature 98 F (36.7 C), temperature source Oral, resp. rate 20, height 5\' 10"  (1.778 m), weight 86.4 kg (190 lb 7.6 oz), SpO2 99.00%.  Lungs clear. Cardiac exam was normal.   Labs:  CBC:  Lab Results   Component  Value  Date    WBC  6.1  03/16/2011    HGB  15.7  03/16/2011    HCT  45.3  03/16/2011    MCV  93.8  03/16/2011    PLT  160  03/16/2011    CMP:  Lab  03/19/11 0610  03/16/11 1553   NA  139  --   K  4.2  --   CL  104  --   CO2  29  --   BUN  13  --   CREATININE  0.80  --   CALCIUM  9.2  --   PROT  --  7.2   BILITOT  --  0.6   ALKPHOS  --  110   ALT  --  95*   AST  --  70*   GLUCOSE  111*  --    EKG:  Normal sinus rhythm. QTC is 0.46. Mild IV conduction delay.   Discharge Medications:   Randy Evans, Randy Evans   Home Medication Instructions  [id number]    Printed on:03/19/11 9562    Medication Information                     allopurinol (ZYLOPRIM) 300 MG tablet  Take 300 mg by mouth daily.           amLODipine (NORVASC) 5 MG tablet  Take 5 mg by mouth daily.           losartan (COZAAR) 50 MG tablet  Take 50 mg by mouth daily.           dabigatran (PRADAXA) 150 MG CAPS  Take 150 mg by mouth every 12 (twelve) hours.           chlorpheniramine (CHLOR-TRIMETON) 4 MG tablet  Take 4 mg by mouth 2 (two) times daily as needed. allergies           acetaminophen (TYLENOL) 500 MG tablet  Take 1,000 mg by mouth every 6 (six) hours as needed. pain           Multiple Vitamins-Minerals (MULTIVITAMINS THER. W/MINERALS) TABS  Take 1 tablet by mouth daily.  multivitamin-lutein (OCUVITE-LUTEIN) CAPS  Take 1 capsule by mouth daily.           omega-3 acid ethyl esters (LOVAZA) 1 G capsule  Take 1 g by mouth 2 (two) times daily.           calcium citrate-vitamin D 200-200 MG-UNIT TABS  Take 1 tablet by mouth daily.           Glucosamine-Chondroit-Vit C-Mn (GLUCOSAMINE 1500 COMPLEX PO)  Take 1,500 mg by mouth daily after supper.           CHONDROITIN SULFATE A PO  Take 1,200 mg by mouth daily after supper.           dofetilide (TIKOSYN) 500 MCG capsule  Take 1 capsule (500 mcg total) by mouth every 12 (twelve) hours.           metoprolol (LOPRESSOR) 50 MG tablet  Take 0.5 tablets (25 mg total) by mouth 2 (two) times daily.             Followup plans and appointments:   Call Dr. Donnie Aho for an appointment in one week.   Signed:   Darden Palmer. MD [facility]  03/19/2011, 8:54 AM

## 2011-03-19 NOTE — H&P (Signed)
Physician Discharge Summary  Patient ID: Randy Evans MRN: [id number] DOB/AGE: 11-10-1940 70 y.o.  Admit date: 03/16/2011 Discharge date: 03/19/2011  Primary Discharge Diagnosis: 1. Atrial tachycardia resolved with administration  ofTikosyn  Secondary Discharge Diagnosis: 2. Hypertensive heart disease 3. Long-term anticoagulation treated with Pradaxa  4. History of hemachromatosis  5. History of cerebral aneurysm treated with coil ablation 6. History of recurrent atrial fibrillation treated with ablation twice 7. History of gout  Hospital Course:  The patient was admitted to the hospital to initiate Tikosyn for recurrent atrial septal tachycardia following ablation. He was admitted to the hospital and laboratory work was done in Tikosyn was initiated without difficulty. He converted to sinus rhythm following the first dose of Tikosyn and his QTC did not show significant QT prolongation.Marland Kitchen He was ambulatory in the hall and had no recurrent arrhythmias and is discharged home in improved condition. He was given patient education about Tikosyn.  Discharge Exam: Blood pressure 150/89, pulse 59, temperature 98 F (36.7 C), temperature source Oral, resp. rate 20, height 5\' 10"  (1.778 m), weight 86.4 kg (190 lb 7.6 oz), SpO2 99.00%.    Lungs clear. Cardiac exam was normal.  Labs: CBC:   Lab Results  Component Value Date   WBC 6.1 03/16/2011   HGB 15.7 03/16/2011   HCT 45.3 03/16/2011   MCV 93.8 03/16/2011   PLT 160 03/16/2011   CMP:  Lab 03/19/11 0610 03/16/11 1553  NA 139 --  K 4.2 --  CL 104 --  CO2 29 --  BUN 13 --  CREATININE 0.80 --  CALCIUM 9.2 --  PROT -- 7.2  BILITOT -- 0.6  ALKPHOS -- 110  ALT -- 95*  AST -- 70*  GLUCOSE 111* --   EKG: Normal sinus rhythm. QTC is 0.46. Mild IV conduction delay.  Discharge Medications:  Kelyn, Koskela  Home Medication Instructions [id number]   Printed on:03/19/11 9562  Medication Information                      allopurinol (ZYLOPRIM) 300 MG tablet Take 300 mg by mouth daily.             amLODipine (NORVASC) 5 MG tablet Take 5 mg by mouth daily.             losartan (COZAAR) 50 MG tablet Take 50 mg by mouth daily.             dabigatran (PRADAXA) 150 MG CAPS Take 150 mg by mouth every 12 (twelve) hours.             chlorpheniramine (CHLOR-TRIMETON) 4 MG tablet Take 4 mg by mouth 2 (two) times daily as needed. allergies            acetaminophen (TYLENOL) 500 MG tablet Take 1,000 mg by mouth every 6 (six) hours as needed. pain            Multiple Vitamins-Minerals (MULTIVITAMINS THER. W/MINERALS) TABS Take 1 tablet by mouth daily.             multivitamin-lutein (OCUVITE-LUTEIN) CAPS Take 1 capsule by mouth daily.             omega-3 acid ethyl esters (LOVAZA) 1 G capsule Take 1 g by mouth 2 (two) times daily.             calcium citrate-vitamin D 200-200 MG-UNIT TABS Take 1 tablet by mouth daily.  Glucosamine-Chondroit-Vit C-Mn (GLUCOSAMINE 1500 COMPLEX PO) Take 1,500 mg by mouth daily after supper.             CHONDROITIN SULFATE A PO Take 1,200 mg by mouth daily after supper.             dofetilide (TIKOSYN) 500 MCG capsule Take 1 capsule (500 mcg total) by mouth every 12 (twelve) hours.           metoprolol (LOPRESSOR) 50 MG tablet Take 0.5 tablets (25 mg total) by mouth 2 (two) times daily.             Followup plans and appointments:  Call Dr. Donnie Aho for an appointment in one week.  Signed: Darden Palmer. MD [facility] 03/19/2011, 8:54 AM

## 2011-04-02 ENCOUNTER — Other Ambulatory Visit: Payer: Self-pay | Admitting: Cardiology

## 2011-06-29 DIAGNOSIS — Z961 Presence of intraocular lens: Secondary | ICD-10-CM | POA: Diagnosis not present

## 2011-06-29 DIAGNOSIS — I671 Cerebral aneurysm, nonruptured: Secondary | ICD-10-CM | POA: Diagnosis not present

## 2011-06-29 DIAGNOSIS — I10 Essential (primary) hypertension: Secondary | ICD-10-CM | POA: Diagnosis not present

## 2011-06-29 DIAGNOSIS — H35369 Drusen (degenerative) of macula, unspecified eye: Secondary | ICD-10-CM | POA: Diagnosis not present

## 2011-06-29 DIAGNOSIS — Z7901 Long term (current) use of anticoagulants: Secondary | ICD-10-CM | POA: Diagnosis not present

## 2011-06-29 DIAGNOSIS — H2640 Unspecified secondary cataract: Secondary | ICD-10-CM | POA: Diagnosis not present

## 2011-06-29 DIAGNOSIS — I4891 Unspecified atrial fibrillation: Secondary | ICD-10-CM | POA: Diagnosis not present

## 2011-06-29 DIAGNOSIS — H524 Presbyopia: Secondary | ICD-10-CM | POA: Diagnosis not present

## 2011-07-22 DIAGNOSIS — R7301 Impaired fasting glucose: Secondary | ICD-10-CM | POA: Diagnosis not present

## 2011-07-22 DIAGNOSIS — R7309 Other abnormal glucose: Secondary | ICD-10-CM | POA: Diagnosis not present

## 2011-08-02 ENCOUNTER — Encounter (HOSPITAL_COMMUNITY): Payer: Self-pay | Admitting: Pharmacy Technician

## 2011-08-02 ENCOUNTER — Other Ambulatory Visit: Payer: Self-pay | Admitting: Cardiology

## 2011-08-02 ENCOUNTER — Ambulatory Visit
Admission: RE | Admit: 2011-08-02 | Discharge: 2011-08-02 | Disposition: A | Discharge status: Home / Self Care | Payer: Medicare Other | Source: Ambulatory Visit | Attending: Cardiology | Admitting: Cardiology

## 2011-08-02 DIAGNOSIS — I4891 Unspecified atrial fibrillation: Secondary | ICD-10-CM | POA: Diagnosis not present

## 2011-08-02 DIAGNOSIS — I10 Essential (primary) hypertension: Secondary | ICD-10-CM | POA: Diagnosis not present

## 2011-08-02 DIAGNOSIS — R0602 Shortness of breath: Secondary | ICD-10-CM | POA: Diagnosis not present

## 2011-08-02 DIAGNOSIS — I671 Cerebral aneurysm, nonruptured: Secondary | ICD-10-CM | POA: Diagnosis not present

## 2011-08-02 DIAGNOSIS — Z7901 Long term (current) use of anticoagulants: Secondary | ICD-10-CM | POA: Diagnosis not present

## 2011-08-02 NOTE — H&P (Signed)
Randy Evans  Date of visit:  08/02/2011 DOB:  12-17-1940    Age:  71 yrs. Medical record number:  [id number]     Account number:  [id number] Primary Care Provider: Darrow Bussing ____________________________ CURRENT DIAGNOSES  1. Arrhythmia-Atrial Fibrillation  2. Hypertension-Essential (Benign)  3. Long-term (current) use of anticoagulants  4. Iron Overload Nos  5. Cerebral Aneurysm, Nonruptured ____________________________ ALLERGIES  codeine sulfate  Sulfonamides ____________________________ MEDICATIONS  1. Multi-Day tablet, 1 p.o. daily  2. Chlor-Trimeton 4 mg Tablet, PRN  3. Ocuvite Tablet, 1 p.o. daily  4. Calcium 600 600 mg (1,500 mg) Tablet, 1 p.o. daily  5. allopurinol 300 mg Tablet, 1 p.o. q.d.  6. amlodipine 5 mg Tablet, 1 p.o. daily  7. losartan 50 mg Tablet, 1 p.o. daily  8. Tikosyn 500 mcg capsule, BID  9. Pradaxa 150 mg capsule, BID  10. metoprolol tartrate 50 mg tablet, 1/2 bid ____________________________ CHIEF COMPLAINTS  B/p elevated  Elevated pulse ____________________________ HISTORY OF PRESENT ILLNESS  Patient seen early for cardiac followup. He noted that he does have an elevated blood pressure and that his pulse was elevated and he has been more fatigued. He has been keeping a watch on this after he returned from [place] and comes in today and was found to be back in atrial flutter or tachycardia with block his blood pressure was not as elevated in the office as it had been at home for him. He is not currently having angina and denies PND, orthopnea, or edema. He remains long-term anticoagulation with Pradaxa and is tolerating Tikosyn relatively well.  ____________________________ PAST HISTORY  Past Medical Illnesses:  hypertension, elevated liver enzymes with possible hemachromatosis, history of cerebral aneurysm treated with coil embolization at [facility], gout;  Cardiovascular Illnesses:  atrial fibrillation-paroxysmal;  Surgical Procedures:  liver biopsy,  toe surgery, tumor left leg removed;  Cardiology Procedures-Invasive:  cardiac cath (left) May 1991, cardioversion February 2008, April 2008, Deceber 2009, [place] ablation for atrial fibrillation October 2009 and redo 07/2008, cardioversion November 2012;  Cardiology Procedures-Noninvasive:  echocardiogram January 2008, history of cardioversions, treadmill cardiolite April 2012, echocardiogram November 2012;  Cardiac Cath Results:  normal LVSF, normal coronary arteries;  Peripheral Vascular Procedures:  embolization of cerebral artery 5/06 at [facility];  LVEF of 60% documented via echocardiogram on 03/10/2011 ____________________________ CARDIO-PULMONARY TEST DATES EKG Date:  08/02/2011;   Cardiac Cath Date:  09/01/1999;  Nuclear Study Date:  07/09/2010;  Echocardiography Date: 03/03/2011;  Chest Xray Date: 08/02/2011;   ____________________________ FAMILY HISTORY Father - age 43, died of Alzheimer's,  prostate cancer; Mother - age 52, died of lymphoma; Sister 1 -  alive and well; Grandfather (P) -  died of CVA;  ____________________________ SOCIAL HISTORY Alcohol Use:  drinks occasionally;  Smoking:  used to smoke but quit;  Diet:  [employer];  Lifestyle:  married;  Education:  college degree and [employer];  Exercise:  exercises regularly;  Occupation:  retired;  Residence:  lives with wife;  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  Eyes:  wears eye glasses/contact lenses, cataract extraction bilaterally   Ears, Nose, Throat, Mouth:  denies any hearing loss, epistaxis, hoarseness or difficulty speaking.  Respiratory:  denies dyspnea, cough, wheezing or hemoptysis.  Cardiovascular:  please review HPI  Abdominal:  denies dyspepsia, GI bleeding, constipation, or diarrhea Genitourinary-Male:  no dysuria, urgency, frequency, or nocturia  Musculoskeletal:  arthritis of the fingers   Neurological:  denies headaches, stroke, or TIA  Psychiatric:  denies depession or anxiety. ____________________________ PHYSICAL EXAMINATION VITAL SIGNS  Blood Pressure:  138/80 Sitting, Left arm, regular cuff  , 136/90 Standing, Left arm and regular cuff   Pulse:  100/min. Weight:  196.00 lbs. Height:  69"BMI: 29  Constitutional:  pleasant white male in no acute distress Skin:  warm and dry to touch, no apparent skin lesions, or masses noted. Head:  normocephalic, balding male hair pattern Eyes:  EOMS Intact, PERRLA, C and S clear, Funduscopic exam not done. ENT:  ears, nose and throat reveal no gross abnormalities.  No pallor or cyanosis.  Dentition good. Neck:  supple, no masses, thyromegaly, JVD. Carotid pulses are full and equal bilaterally without bruits. Chest:  normal symmetry, clear to auscultation and percussion. Cardiac:  irregular rhythm, normal S1 and S2, no S3 or S4, no murmurs,clicks, or rub heard Abdomen:  abdomen soft,non-tender, no masses, no hepatospenomegaly, or aneurysm noted Peripheral Pulses:  the femoral,dorsalis pedis, and posterior tibial pulses are full and equal bilaterally with no bruits auscultated. Extremities & Back:  no deformities, clubbing, cyanosis, erythema or edema observed. Normal muscle strength and tone. Neurological:  no motor or sensory deficits noted, affect appropriate, oriented x3. ____________________________ IMPRESSIONS/PLAN  1. Recurrent atrial flutter that is symptomatic-this previous flutter or tachycardia circuit is thought to be septal and has been resistant to ablation before by Dr. Sampson Goon. Previous options for management of this included changing him to Tikosyn and also considering ablation perhaps by Dr. Lennox Solders in [place]. 2. Hypertension borderline control today 3. Malaise and fatigue likely due to recurrent atrial flutter that has  persisted despite previous ablation  Recommendations:  Discussed treatment options with  patient. At this point we decided to do another cardioversion to see if he would maintain sinus rhythm. His last cardioversion was in November of last year but he reverted and required placement of Tikosyn. Lab work as noted below. His EKG shows atrial tachycardia with block. Risks of cardioversion were discussed with him and he is agreeable. Plan to do this on Thursday. If he recurs may consider referral to Dr. Lennox Solders for opinion.  _______________________ TODAYS ORDERS  1. 12 Lead EKG: Today  2. Electrical Cardioversion: 3 days  3. CHEST XRAY: Today  4. Comprehensive Metabolic Panel: Today  5. TSH: Today  6. Complete Blood Count: Today                       ____________________________ Cardiology Physician:  Darden Palmer MD [facility]

## 2011-08-05 ENCOUNTER — Encounter (HOSPITAL_COMMUNITY)
Admission: RE | Disposition: A | Discharge status: Home / Self Care | Payer: Self-pay | Source: Ambulatory Visit | Attending: Cardiology

## 2011-08-05 ENCOUNTER — Ambulatory Visit (HOSPITAL_COMMUNITY): Payer: Medicare Other | Admitting: *Deleted

## 2011-08-05 ENCOUNTER — Encounter (HOSPITAL_COMMUNITY): Payer: Self-pay | Admitting: *Deleted

## 2011-08-05 ENCOUNTER — Ambulatory Visit (HOSPITAL_COMMUNITY)
Admission: RE | Admit: 2011-08-05 | Discharge: 2011-08-05 | Disposition: A | Discharge status: Home / Self Care | Payer: Medicare Other | Source: Ambulatory Visit | Attending: Cardiology | Admitting: Cardiology

## 2011-08-05 DIAGNOSIS — I671 Cerebral aneurysm, nonruptured: Secondary | ICD-10-CM | POA: Insufficient documentation

## 2011-08-05 DIAGNOSIS — I10 Essential (primary) hypertension: Secondary | ICD-10-CM | POA: Insufficient documentation

## 2011-08-05 DIAGNOSIS — I4891 Unspecified atrial fibrillation: Secondary | ICD-10-CM | POA: Insufficient documentation

## 2011-08-05 DIAGNOSIS — Z7901 Long term (current) use of anticoagulants: Secondary | ICD-10-CM | POA: Diagnosis not present

## 2011-08-05 HISTORY — PX: CARDIOVERSION: SHX1299

## 2011-08-05 SURGERY — CARDIOVERSION
Anesthesia: General | Wound class: Clean

## 2011-08-05 MED ORDER — HYDROCORTISONE 1 % EX CREA: 1.0000 "application " | TOPICAL_CREAM | Freq: Three times a day (TID) | CUTANEOUS | Status: DC | PRN

## 2011-08-05 MED ORDER — PROPOFOL 10 MG/ML IV BOLUS
INTRAVENOUS | Status: DC | PRN
  Administered 2011-08-05: 80 mg via INTRAVENOUS

## 2011-08-05 NOTE — Anesthesia Postprocedure Evaluation (Signed)
  Anesthesia Post-op Note  Patient: Randy Evans  Procedure(s) Performed: Procedure(s) (LRB): CARDIOVERSION (N/A)  Patient Location: PACU  Anesthesia Type: General  Level of Consciousness: awake, alert  and oriented  Airway and Oxygen Therapy: Patient Spontanous Breathing  Post-op Pain: none  Post-op Assessment: Post-op Vital signs reviewed  Post-op Vital Signs: Reviewed  Complications: No apparent anesthesia complications

## 2011-08-05 NOTE — Discharge Instructions (Addendum)
PATIENT INSTRUCTIONS  POST-ANESTHESIA    IMMEDIATELY FOLLOWING SURGERY:  Do not drive or operate machinery for the first twenty four hours after surgery.  Do not make any important decisions for twenty four hours after surgery or while taking narcotic pain medications or sedatives.  If you develop intractable nausea and vomiting or a severe headache please notify your doctor immediately.    QUESTIONS?:  Please feel free to call your physician or the hospital operator if you have any questions, and they will be happy to assist you.       [facility] Anesthesia Department  [address]  [place]  [phone]

## 2011-08-05 NOTE — Transfer of Care (Signed)
Immediate Anesthesia Transfer of Care Note  Patient: Randy Evans  Procedure(s) Performed: Procedure(s) (LRB): CARDIOVERSION (N/A)  Patient Location: PACU and Short Stay  Anesthesia Type: General  Level of Consciousness: awake, alert  and oriented  Airway & Oxygen Therapy: Patient Spontanous Breathing and Patient connected to nasal cannula oxygen  Post-op Assessment: Report given to PACU RN and Post -op Vital signs reviewed and stable  Post vital signs: Reviewed and stable  Complications: No apparent anesthesia complications

## 2011-08-05 NOTE — Anesthesia Preprocedure Evaluation (Addendum)
Anesthesia Evaluation  Patient identified by MRN, date of birth, ID band Patient awake    Reviewed: Allergy & Precautions, H&P , NPO status , Patient's Chart, lab work & pertinent test results, reviewed documented beta blocker date and time   Airway Mallampati: I TM Distance: >3 FB     Dental  (+) Dental Advisory Given   Pulmonary  breath sounds clear to auscultation        Cardiovascular hypertension, + dysrhythmias Atrial Fibrillation Rhythm:Irregular Rate:Normal     Neuro/Psych    GI/Hepatic   Endo/Other    Renal/GU      Musculoskeletal   Abdominal   Peds  Hematology   Anesthesia Other Findings   Reproductive/Obstetrics                          Anesthesia Physical Anesthesia Plan  ASA: III  Anesthesia Plan:    Post-op Pain Management:    Induction: Intravenous  Airway Management Planned: Mask  Additional Equipment:   Intra-op Plan:   Post-operative Plan:   Informed Consent: I have reviewed the patients History and Physical, chart, labs and discussed the procedure including the risks, benefits and alternatives for the proposed anesthesia with the patient or authorized representative who has indicated his/her understanding and acceptance.   Dental advisory given  Plan Discussed with: CRNA, Anesthesiologist and Surgeon  Anesthesia Plan Comments:         Anesthesia Quick Evaluation

## 2011-08-05 NOTE — Preoperative (Signed)
Beta Blockers   Reason not to administer Beta Blockers:Not Applicable 

## 2011-08-05 NOTE — H&P (View-Only) (Signed)
 Randy Evans, Randy Evans  Date of visit:  08/02/2011 DOB:  06/11/1940    Age:  71 yrs. Medical record number:  [id number]     Account number:  [id number] Primary Care Provider: KOIRALA,DIBAS ____________________________ CURRENT DIAGNOSES  1. Arrhythmia-Atrial Fibrillation  2. Hypertension-Essential (Benign)  3. Long-term (current) use of anticoagulants  4. Iron Overload Nos  5. Cerebral Aneurysm, Nonruptured ____________________________ ALLERGIES  codeine sulfate  Sulfonamides ____________________________ MEDICATIONS  1. Multi-Day tablet, 1 p.o. daily  2. Chlor-Trimeton 4 mg Tablet, PRN  3. Ocuvite Tablet, 1 p.o. daily  4. Calcium 600 600 mg (1,500 mg) Tablet, 1 p.o. daily  5. allopurinol 300 mg Tablet, 1 p.o. q.d.  6. amlodipine 5 mg Tablet, 1 p.o. daily  7. losartan 50 mg Tablet, 1 p.o. daily  8. Tikosyn 500 mcg capsule, BID  9. Pradaxa 150 mg capsule, BID  10. metoprolol tartrate 50 mg tablet, 1/2 bid ____________________________ CHIEF COMPLAINTS  B/p elevated  Elevated pulse ____________________________ HISTORY OF PRESENT ILLNESS  Patient seen early for cardiac followup. He noted that he does have an elevated blood pressure and that his pulse was elevated and he has been more fatigued. He has been keeping a watch on this after he returned from [place] and comes in today and was found to be back in atrial flutter or tachycardia with block his blood pressure was not as elevated in the office as it had been at home for him. He is not currently having angina and denies PND, orthopnea, or edema. He remains long-term anticoagulation with Pradaxa and is tolerating Tikosyn relatively well.  ____________________________ PAST HISTORY  Past Medical Illnesses:  hypertension, elevated liver enzymes with possible hemachromatosis, history of cerebral aneurysm treated with coil embolization at [facility], gout;  Cardiovascular Illnesses:  atrial fibrillation-paroxysmal;  Surgical Procedures:  liver biopsy,  toe surgery, tumor left leg removed;  Cardiology Procedures-Invasive:  cardiac cath (left) May 1991, cardioversion February 2008, April 2008, Deceber 2009, [place] ablation for atrial fibrillation October 2009 and redo 07/2008, cardioversion November 2012;  Cardiology Procedures-Noninvasive:  echocardiogram January 2008, history of cardioversions, treadmill cardiolite April 2012, echocardiogram November 2012;  Cardiac Cath Results:  normal LVSF, normal coronary arteries;  Peripheral Vascular Procedures:  embolization of cerebral artery 5/06 at [facility];  LVEF of 60% documented via echocardiogram on 03/10/2011 ____________________________ CARDIO-PULMONARY TEST DATES EKG Date:  08/02/2011;   Cardiac Cath Date:  09/01/1999;  Nuclear Study Date:  07/09/2010;  Echocardiography Date: 03/03/2011;  Chest Xray Date: 08/02/2011;   ____________________________ FAMILY HISTORY Father - age 83, died of Alzheimer's,  prostate cancer; Mother - age 79, died of lymphoma; Sister 1 -  alive and well; Grandfather (P) -  died of CVA;  ____________________________ SOCIAL HISTORY Alcohol Use:  drinks occasionally;  Smoking:  used to smoke but quit;  Diet:  [place] diet;  Lifestyle:  married;  Education:  college degree and [employer];  Exercise:  exercises regularly;  Occupation:  retired;  Residence:  lives with wife;  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  Eyes:  wears eye glasses/contact lenses, cataract extraction bilaterally   Ears, Nose, Throat, Mouth:  denies any hearing loss, epistaxis, hoarseness or difficulty speaking.  Respiratory:  denies dyspnea, cough, wheezing or hemoptysis.  Cardiovascular:  please review HPI  Abdominal:  denies dyspepsia, GI bleeding, constipation, or diarrhea Genitourinary-Male:  no dysuria, urgency, frequency, or nocturia  Musculoskeletal:    arthritis of the fingers   Neurological:  denies headaches, stroke, or TIA  Psychiatric:  denies depession or anxiety. ____________________________ PHYSICAL EXAMINATION VITAL SIGNS  Blood Pressure:  138/80 Sitting, Left arm, regular cuff  , 136/90 Standing, Left arm and regular cuff   Pulse:  100/min. Weight:  196.00 lbs. Height:  69"BMI: 29  Constitutional:  pleasant white male in no acute distress Skin:  warm and dry to touch, no apparent skin lesions, or masses noted. Head:  normocephalic, balding male hair pattern Eyes:  EOMS Intact, PERRLA, C and S clear, Funduscopic exam not done. ENT:  ears, nose and throat reveal no gross abnormalities.  No pallor or cyanosis.  Dentition good. Neck:  supple, no masses, thyromegaly, JVD. Carotid pulses are full and equal bilaterally without bruits. Chest:  normal symmetry, clear to auscultation and percussion. Cardiac:  irregular rhythm, normal S1 and S2, no S3 or S4, no murmurs,clicks, or rub heard Abdomen:  abdomen soft,non-tender, no masses, no hepatospenomegaly, or aneurysm noted Peripheral Pulses:  the femoral,dorsalis pedis, and posterior tibial pulses are full and equal bilaterally with no bruits auscultated. Extremities & Back:  no deformities, clubbing, cyanosis, erythema or edema observed. Normal muscle strength and tone. Neurological:  no motor or sensory deficits noted, affect appropriate, oriented x3. ____________________________ IMPRESSIONS/PLAN  1. Recurrent atrial flutter that is symptomatic-this previous flutter or tachycardia circuit is thought to be septal and has been resistant to ablation before by Dr. Fitzgerald. Previous options for management of this included changing him to Tikosyn and also considering ablation perhaps by Dr. Mouncy in [place]. 2. Hypertension borderline control today 3. Malaise and fatigue likely due to recurrent atrial flutter that has  persisted despite previous ablation  Recommendations:  Discussed treatment options with  patient. At this point we decided to do another cardioversion to see if he would maintain sinus rhythm. His last cardioversion was in November of last year but he reverted and required placement of Tikosyn. Lab work as noted below. His EKG shows atrial tachycardia with block. Risks of cardioversion were discussed with him and he is agreeable. Plan to do this on Thursday. If he recurs may consider referral to Dr. Mouncy for opinion.  _______________________ TODAYS ORDERS  1. 12 Lead EKG: Today  2. Electrical Cardioversion: 3 days  3. CHEST XRAY: Today  4. Comprehensive Metabolic Panel: Today  5. TSH: Today  6. Complete Blood Count: Today                       ____________________________ Cardiology Physician:  W. Spencer Maaz Spiering, Jr. MD [facility]    

## 2011-08-05 NOTE — CV Procedure (Signed)
Electrical Cardioversion Procedure Note  Randy Evans   71 y.o. male MRN: [id number] DOB: [birth year]  Today's date: 08/05/2011  Procedure: Electrical Cardioversion  Indications:  Atrial Tachycardia  Time Out: Verified patient identification, verified procedure,medications/allergies/relevent history reviewed, required imaging and test results available.    Procedure Details  The patient was NPO after midnight. Anesthesia was administered at the beside  by Dr.Crews with 100 mg of propofol.  Cardioversion was done with synchronized biphasic defibrillation with AP pads with 100 watts.  The patient converted to normal sinus rhythm. The patient tolerated the procedure well   IMPRESSION:   Successful cardioversion of atrial fibrillation    W. Viann Fish, Randy Evans. MD [facility]   08/05/2011, 1:21 PM

## 2011-08-05 NOTE — Interval H&P Note (Signed)
History and Physical Interval Note:  08/05/2011 1:11 PM  Randy Evans  has presented today for cardioversion with the diagnosis of AFIB  The various methods of treatment have been discussed with the patient and family. After consideration of risks, benefits and other options for treatment, the patient has consented to  Procedure: CARDIOVERSION.  The patients' history has been reviewed, patient examined, no change in status, stable for surgery.  I have reviewed the patients' chart and labs.  Questions were answered to the patient's satisfaction.    Darden Palmer MD [facility]

## 2011-08-09 ENCOUNTER — Encounter (HOSPITAL_COMMUNITY): Payer: Self-pay | Admitting: Cardiology

## 2011-08-16 DIAGNOSIS — I4891 Unspecified atrial fibrillation: Secondary | ICD-10-CM | POA: Diagnosis not present

## 2011-08-16 DIAGNOSIS — I671 Cerebral aneurysm, nonruptured: Secondary | ICD-10-CM | POA: Diagnosis not present

## 2011-08-16 DIAGNOSIS — I10 Essential (primary) hypertension: Secondary | ICD-10-CM | POA: Diagnosis not present

## 2011-08-16 DIAGNOSIS — Z7901 Long term (current) use of anticoagulants: Secondary | ICD-10-CM | POA: Diagnosis not present

## 2011-09-27 DIAGNOSIS — K3189 Other diseases of stomach and duodenum: Secondary | ICD-10-CM | POA: Diagnosis not present

## 2011-09-27 DIAGNOSIS — Z1211 Encounter for screening for malignant neoplasm of colon: Secondary | ICD-10-CM | POA: Diagnosis not present

## 2011-09-27 DIAGNOSIS — R1013 Epigastric pain: Secondary | ICD-10-CM | POA: Diagnosis not present

## 2011-10-12 DIAGNOSIS — I4891 Unspecified atrial fibrillation: Secondary | ICD-10-CM | POA: Diagnosis not present

## 2011-10-12 DIAGNOSIS — I671 Cerebral aneurysm, nonruptured: Secondary | ICD-10-CM | POA: Diagnosis not present

## 2011-10-12 DIAGNOSIS — Z7901 Long term (current) use of anticoagulants: Secondary | ICD-10-CM | POA: Diagnosis not present

## 2011-10-12 DIAGNOSIS — I10 Essential (primary) hypertension: Secondary | ICD-10-CM | POA: Diagnosis not present

## 2011-11-05 DIAGNOSIS — I471 Supraventricular tachycardia, unspecified: Secondary | ICD-10-CM | POA: Diagnosis not present

## 2011-11-05 DIAGNOSIS — I517 Cardiomegaly: Secondary | ICD-10-CM | POA: Diagnosis not present

## 2011-11-05 DIAGNOSIS — I498 Other specified cardiac arrhythmias: Secondary | ICD-10-CM | POA: Diagnosis not present

## 2011-11-05 DIAGNOSIS — I4891 Unspecified atrial fibrillation: Secondary | ICD-10-CM | POA: Diagnosis not present

## 2011-11-05 DIAGNOSIS — I4892 Unspecified atrial flutter: Secondary | ICD-10-CM | POA: Diagnosis not present

## 2011-11-05 DIAGNOSIS — Z79899 Other long term (current) drug therapy: Secondary | ICD-10-CM | POA: Diagnosis not present

## 2012-01-04 ENCOUNTER — Other Ambulatory Visit: Payer: Self-pay | Admitting: Cardiology

## 2012-01-25 DIAGNOSIS — I4891 Unspecified atrial fibrillation: Secondary | ICD-10-CM | POA: Diagnosis not present

## 2012-01-25 DIAGNOSIS — I517 Cardiomegaly: Secondary | ICD-10-CM | POA: Diagnosis not present

## 2012-01-25 DIAGNOSIS — I4892 Unspecified atrial flutter: Secondary | ICD-10-CM | POA: Diagnosis not present

## 2012-01-25 DIAGNOSIS — R5381 Other malaise: Secondary | ICD-10-CM | POA: Diagnosis not present

## 2012-01-25 DIAGNOSIS — R5383 Other fatigue: Secondary | ICD-10-CM | POA: Diagnosis not present

## 2012-01-25 DIAGNOSIS — I498 Other specified cardiac arrhythmias: Secondary | ICD-10-CM | POA: Diagnosis not present

## 2012-01-26 DIAGNOSIS — I4891 Unspecified atrial fibrillation: Secondary | ICD-10-CM | POA: Diagnosis not present

## 2012-01-26 DIAGNOSIS — I498 Other specified cardiac arrhythmias: Secondary | ICD-10-CM | POA: Diagnosis not present

## 2012-01-26 DIAGNOSIS — I4510 Unspecified right bundle-branch block: Secondary | ICD-10-CM | POA: Diagnosis not present

## 2012-01-26 DIAGNOSIS — R9431 Abnormal electrocardiogram [ECG] [EKG]: Secondary | ICD-10-CM | POA: Diagnosis not present

## 2012-01-26 DIAGNOSIS — I4892 Unspecified atrial flutter: Secondary | ICD-10-CM | POA: Diagnosis not present

## 2012-01-27 DIAGNOSIS — I4892 Unspecified atrial flutter: Secondary | ICD-10-CM | POA: Diagnosis not present

## 2012-01-27 DIAGNOSIS — R9431 Abnormal electrocardiogram [ECG] [EKG]: Secondary | ICD-10-CM | POA: Diagnosis not present

## 2012-01-27 DIAGNOSIS — I498 Other specified cardiac arrhythmias: Secondary | ICD-10-CM | POA: Diagnosis not present

## 2012-01-27 DIAGNOSIS — I4510 Unspecified right bundle-branch block: Secondary | ICD-10-CM | POA: Diagnosis not present

## 2012-01-27 DIAGNOSIS — I4891 Unspecified atrial fibrillation: Secondary | ICD-10-CM | POA: Diagnosis not present

## 2012-02-10 DIAGNOSIS — M109 Gout, unspecified: Secondary | ICD-10-CM | POA: Diagnosis not present

## 2012-02-10 DIAGNOSIS — I10 Essential (primary) hypertension: Secondary | ICD-10-CM | POA: Diagnosis not present

## 2012-02-10 DIAGNOSIS — Z23 Encounter for immunization: Secondary | ICD-10-CM | POA: Diagnosis not present

## 2012-02-10 DIAGNOSIS — R7309 Other abnormal glucose: Secondary | ICD-10-CM | POA: Diagnosis not present

## 2012-02-10 DIAGNOSIS — Z0000 Encounter for general adult medical examination without abnormal findings: Secondary | ICD-10-CM | POA: Diagnosis not present

## 2012-02-10 DIAGNOSIS — Z136 Encounter for screening for cardiovascular disorders: Secondary | ICD-10-CM | POA: Diagnosis not present

## 2012-02-10 DIAGNOSIS — Z125 Encounter for screening for malignant neoplasm of prostate: Secondary | ICD-10-CM | POA: Diagnosis not present

## 2012-02-23 DIAGNOSIS — B079 Viral wart, unspecified: Secondary | ICD-10-CM | POA: Diagnosis not present

## 2012-02-23 DIAGNOSIS — D235 Other benign neoplasm of skin of trunk: Secondary | ICD-10-CM | POA: Diagnosis not present

## 2012-03-14 DIAGNOSIS — Z79899 Other long term (current) drug therapy: Secondary | ICD-10-CM | POA: Diagnosis not present

## 2012-03-14 DIAGNOSIS — I4891 Unspecified atrial fibrillation: Secondary | ICD-10-CM | POA: Diagnosis not present

## 2012-03-14 DIAGNOSIS — Z9889 Other specified postprocedural states: Secondary | ICD-10-CM | POA: Diagnosis not present

## 2012-03-21 DIAGNOSIS — H2640 Unspecified secondary cataract: Secondary | ICD-10-CM | POA: Diagnosis not present

## 2012-04-06 DIAGNOSIS — H26499 Other secondary cataract, unspecified eye: Secondary | ICD-10-CM | POA: Diagnosis not present

## 2012-04-06 DIAGNOSIS — H2640 Unspecified secondary cataract: Secondary | ICD-10-CM | POA: Diagnosis not present

## 2012-04-13 DIAGNOSIS — H26499 Other secondary cataract, unspecified eye: Secondary | ICD-10-CM | POA: Diagnosis not present

## 2012-04-13 DIAGNOSIS — H2640 Unspecified secondary cataract: Secondary | ICD-10-CM | POA: Diagnosis not present

## 2012-04-17 DIAGNOSIS — Z79899 Other long term (current) drug therapy: Secondary | ICD-10-CM | POA: Diagnosis not present

## 2012-04-17 DIAGNOSIS — I4892 Unspecified atrial flutter: Secondary | ICD-10-CM | POA: Diagnosis not present

## 2012-04-17 DIAGNOSIS — I498 Other specified cardiac arrhythmias: Secondary | ICD-10-CM | POA: Diagnosis not present

## 2012-04-17 DIAGNOSIS — I4891 Unspecified atrial fibrillation: Secondary | ICD-10-CM | POA: Diagnosis not present

## 2012-06-20 DIAGNOSIS — I4892 Unspecified atrial flutter: Secondary | ICD-10-CM | POA: Diagnosis not present

## 2012-06-20 DIAGNOSIS — I498 Other specified cardiac arrhythmias: Secondary | ICD-10-CM | POA: Diagnosis not present

## 2012-06-20 DIAGNOSIS — I4891 Unspecified atrial fibrillation: Secondary | ICD-10-CM | POA: Diagnosis not present

## 2012-06-20 DIAGNOSIS — Z79899 Other long term (current) drug therapy: Secondary | ICD-10-CM | POA: Diagnosis not present

## 2012-07-10 DIAGNOSIS — Z7901 Long term (current) use of anticoagulants: Secondary | ICD-10-CM | POA: Diagnosis not present

## 2012-07-10 DIAGNOSIS — I4891 Unspecified atrial fibrillation: Secondary | ICD-10-CM | POA: Diagnosis not present

## 2012-07-10 DIAGNOSIS — I10 Essential (primary) hypertension: Secondary | ICD-10-CM | POA: Diagnosis not present

## 2012-07-10 DIAGNOSIS — I671 Cerebral aneurysm, nonruptured: Secondary | ICD-10-CM | POA: Diagnosis not present

## 2012-07-19 DIAGNOSIS — I4891 Unspecified atrial fibrillation: Secondary | ICD-10-CM | POA: Diagnosis not present

## 2012-07-24 DIAGNOSIS — Z79899 Other long term (current) drug therapy: Secondary | ICD-10-CM | POA: Diagnosis not present

## 2012-07-24 DIAGNOSIS — I4891 Unspecified atrial fibrillation: Secondary | ICD-10-CM | POA: Diagnosis not present

## 2012-07-24 DIAGNOSIS — R9431 Abnormal electrocardiogram [ECG] [EKG]: Secondary | ICD-10-CM | POA: Diagnosis not present

## 2012-08-03 DIAGNOSIS — Z1211 Encounter for screening for malignant neoplasm of colon: Secondary | ICD-10-CM | POA: Diagnosis not present

## 2013-01-05 DIAGNOSIS — Z23 Encounter for immunization: Secondary | ICD-10-CM | POA: Diagnosis not present

## 2013-01-19 DIAGNOSIS — Z7901 Long term (current) use of anticoagulants: Secondary | ICD-10-CM | POA: Diagnosis not present

## 2013-01-19 DIAGNOSIS — I4891 Unspecified atrial fibrillation: Secondary | ICD-10-CM | POA: Diagnosis not present

## 2013-01-19 DIAGNOSIS — I10 Essential (primary) hypertension: Secondary | ICD-10-CM | POA: Diagnosis not present

## 2013-02-07 DIAGNOSIS — L821 Other seborrheic keratosis: Secondary | ICD-10-CM | POA: Diagnosis not present

## 2013-02-07 DIAGNOSIS — D235 Other benign neoplasm of skin of trunk: Secondary | ICD-10-CM | POA: Diagnosis not present

## 2013-02-07 DIAGNOSIS — L819 Disorder of pigmentation, unspecified: Secondary | ICD-10-CM | POA: Diagnosis not present

## 2013-02-12 DIAGNOSIS — Z136 Encounter for screening for cardiovascular disorders: Secondary | ICD-10-CM | POA: Diagnosis not present

## 2013-02-12 DIAGNOSIS — R7301 Impaired fasting glucose: Secondary | ICD-10-CM | POA: Diagnosis not present

## 2013-02-12 DIAGNOSIS — I4891 Unspecified atrial fibrillation: Secondary | ICD-10-CM | POA: Diagnosis not present

## 2013-02-12 DIAGNOSIS — I10 Essential (primary) hypertension: Secondary | ICD-10-CM | POA: Diagnosis not present

## 2013-02-12 DIAGNOSIS — Z0000 Encounter for general adult medical examination without abnormal findings: Secondary | ICD-10-CM | POA: Diagnosis not present

## 2013-02-12 DIAGNOSIS — M109 Gout, unspecified: Secondary | ICD-10-CM | POA: Diagnosis not present

## 2013-07-04 DIAGNOSIS — H3530 Unspecified macular degeneration: Secondary | ICD-10-CM | POA: Diagnosis not present

## 2013-07-04 DIAGNOSIS — Z961 Presence of intraocular lens: Secondary | ICD-10-CM | POA: Diagnosis not present

## 2013-07-20 DIAGNOSIS — I10 Essential (primary) hypertension: Secondary | ICD-10-CM | POA: Diagnosis not present

## 2013-07-20 DIAGNOSIS — I4891 Unspecified atrial fibrillation: Secondary | ICD-10-CM | POA: Diagnosis not present

## 2014-01-25 ENCOUNTER — Other Ambulatory Visit: Payer: Self-pay | Admitting: Cardiology

## 2014-01-25 ENCOUNTER — Ambulatory Visit
Admission: RE | Admit: 2014-01-25 | Discharge: 2014-01-25 | Disposition: A | Discharge status: Home / Self Care | Payer: Medicare Other | Source: Ambulatory Visit | Attending: Cardiology | Admitting: Cardiology

## 2014-01-25 DIAGNOSIS — I484 Atypical atrial flutter: Secondary | ICD-10-CM | POA: Diagnosis not present

## 2014-01-25 DIAGNOSIS — I480 Paroxysmal atrial fibrillation: Secondary | ICD-10-CM | POA: Diagnosis not present

## 2014-01-25 DIAGNOSIS — Z01810 Encounter for preprocedural cardiovascular examination: Secondary | ICD-10-CM | POA: Diagnosis not present

## 2014-01-25 DIAGNOSIS — I499 Cardiac arrhythmia, unspecified: Secondary | ICD-10-CM | POA: Diagnosis not present

## 2014-01-25 DIAGNOSIS — Z7901 Long term (current) use of anticoagulants: Secondary | ICD-10-CM | POA: Diagnosis not present

## 2014-01-25 NOTE — H&P (Signed)
Randy Evans  Date of visit:  01/25/2014 DOB:  1940-09-19    Age:  73 yrs. Medical record number:  [id number]     Account number:  [id number] Primary Care Provider: Lujean Amel ____________________________ CURRENT DIAGNOSES  1. Atypical Atrial Flutter  2. Long term (current) use of anticoagulants  3. Essential (primary) hypertension  4. Paroxysmal atrial fibrillation  5. Iron Overload Nos  6. Disorders Of Iron Metabolism  7. Cerebral aneurysm, nonruptured ____________________________ ALLERGIES  Codeine, Intolerance-unknown  Sulfa (Sulfonamides), Intolerance-unknown ____________________________ MEDICATIONS  1. Chlor-Trimeton 4 mg Tablet, PRN  2. Ocuvite Tablet, 1 p.o. daily  3. Fish Oil 360 mg-1,200 mg capsule, 1 p.o. daily  4. glucosamine HCl 750 mg tablet, 1 p.o. daily  5. allopurinol 300 mg tablet, 1 p.o. q.d.  6. Tikosyn 250 mcg capsule, BID  7. Eliquis 5 mg tablet, BID  8. metoprolol succinate ER 50 mg tablet,extended release 24 hr, 1 p.o. daily  9. amlodipine 5 mg tablet, 1 p.o. daily  10. losartan 100 mg tablet, 1 p.o. daily ____________________________ CHIEF COMPLAINTS  Followup of Chronic atrial fibrillation ____________________________ HISTORY OF PRESENT ILLNESS Patient returns for cardiac followup. He had gotten back from a retreat and felt as if his heart had gone out of rhythm about 5 days ago. He is now back in atrial flutter. He noted some palpitations as well as mild shortness of breath. He has not skipped any doses of medicine. He denies angina and has no PND, orthopnea or claudication. He has not missed any doses of anticoagulation. ____________________________ PAST HISTORY  Past Medical Illnesses:  hypertension, elevated liver enzymes with possible hemachromatosis, history of cerebral aneurysm treated with coil embolization at [facility], gout;  Cardiovascular Illnesses:  atrial fibrillation-paroxysmal;  Surgical Procedures:  liver biopsy, toe surgery, tumor left  leg removed;  Cardiology Procedures-Invasive:  cardiac cath (left) May 1991, cardioversion February 2008, April 2008, Deceber 2009, [place] ablation for atrial fibrillation October 2009 and redo 07/2008, cardioversion November 2012, cardioversion May 2013, cardioversion May 2013, ablation of atrial tachycardia  [facility] 2014;  Cardiology Procedures-Noninvasive:  echocardiogram January 2008, history of cardioversions, treadmill cardiolite April 2012, echocardiogram November 2012;  Cardiac Cath Results:  normal LVSF, normal coronary arteries;  Peripheral Vascular Procedures:  embolization of cerebral artery 5/06 at [facility];  LVEF of 55% documented via echocardiogram on 11/05/2011,   ____________________________ CARDIO-PULMONARY TEST DATES EKG Date:  01/25/2014;   Cardiac Cath Date:  09/01/1999;  Nuclear Study Date:  07/09/2010;  Echocardiography Date: 03/03/2011;  Chest Xray Date: 01/25/2014;   ____________________________ FAMILY HISTORY Father -- Father dead, Alzheimer's disease, Cancer Mother -- Mother dead, Cancer Sister -- Sister alive and well ____________________________ SOCIAL HISTORY Alcohol Use:  drinks occasionally;  Smoking:  used to smoke but quit;  Diet:  [employer];  Lifestyle:  married;  Education:  college degree and [employer];  Exercise:  exercises regularly;  Occupation:  retired;  Residence:  lives with wife;  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 Eyes: wears eye glasses/contact lenses, cataract extraction bilaterally Respiratory: denies dyspnea, cough, wheezing or hemoptysis. Cardiovascular:  please review HPI  Genitourinary-Male: no dysuria, urgency, frequency, or nocturia  Musculoskeletal:  arthritis of the fingers Neurological:  denies headaches, stroke, or TIA  ____________________________ PHYSICAL EXAMINATION VITAL SIGNS  Blood Pressure:  140/76  Sitting, Right arm, regular cuff  , 142/70 Standing, Right arm and regular cuff  Pulse:  120/min. Weight:  200.00 lbs. Height:  69"BMI: 29  Constitutional:  pleasant white male in no acute distress Skin:  warm and dry to touch, no apparent skin lesions, or masses noted. Head:  normocephalic, balding male hair pattern Neck:  supple, no masses, thyromegaly, JVD. Carotid pulses are full and equal bilaterally without bruits. Chest:  normal symmetry, clear to auscultation and percussion. Cardiac:  rapid regular rhythm, normal S1 and S2, No S3 or S4, no murmurs, gallops or rubs detected. Peripheral Pulses:  the femoral,dorsalis pedis, and posterior tibial pulses are full and equal bilaterally with no bruits auscultated. Extremities & Back:  no deformities, clubbing, cyanosis, erythema or edema observed. Normal muscle strength and tone. Neurological:  no motor or sensory deficits noted, affect appropriate, oriented x3. ____________________________ MOST RECENT LIPID PANEL 02/10/12  CHOL TOTL 151 mg/dl, LDL 99 calc, HDL 31 mg/dl, TRIGLYCER 139 mg/dl and CHOL/HDL 4.9 (Calc) ____________________________ IMPRESSIONS/PLAN  1. Recurrent atypical atrial flutter 2. History of atrial fibrillation with multiple ablations 3. Long-term use of anticoagulation  Recommendations:  Obtain labs and plan cardioversion on Monday. If develops recurrent arrhythmias may need to go back and see electrophysiology. Cardioversion discussed with the patient including risks of stroke, arrhythmia, death, or anesthesia risks. The patient understands and is willing to proceed. EKG shows atrial flutter with 2:1 block. ____________________________ TODAYS ORDERS  1. 12 Lead EKG: Today  2. 2D, color flow, doppler: First Available  3. Chest X-ray PA/Lat: today  4. Comprehensive Metabolic Panel: Today  5. Complete Blood Count: Today                       ____________________________ Cardiology Physician:  Kerry Hough MD [facility]

## 2014-01-28 ENCOUNTER — Encounter (HOSPITAL_COMMUNITY): Payer: Self-pay | Admitting: Anesthesiology

## 2014-01-28 ENCOUNTER — Encounter (HOSPITAL_COMMUNITY)
Admission: RE | Disposition: A | Discharge status: Home / Self Care | Payer: Self-pay | Source: Ambulatory Visit | Attending: Cardiology

## 2014-01-28 ENCOUNTER — Ambulatory Visit (HOSPITAL_COMMUNITY): Payer: Medicare Other | Admitting: Anesthesiology

## 2014-01-28 ENCOUNTER — Ambulatory Visit (HOSPITAL_COMMUNITY)
Admission: RE | Admit: 2014-01-28 | Discharge: 2014-01-28 | Disposition: A | Discharge status: Home / Self Care | Payer: Medicare Other | Source: Ambulatory Visit | Attending: Cardiology | Admitting: Cardiology

## 2014-01-28 ENCOUNTER — Encounter (HOSPITAL_COMMUNITY): Payer: Medicare Other | Admitting: Anesthesiology

## 2014-01-28 DIAGNOSIS — I480 Paroxysmal atrial fibrillation: Secondary | ICD-10-CM | POA: Diagnosis not present

## 2014-01-28 DIAGNOSIS — Z885 Allergy status to narcotic agent status: Secondary | ICD-10-CM | POA: Insufficient documentation

## 2014-01-28 DIAGNOSIS — Z79899 Other long term (current) drug therapy: Secondary | ICD-10-CM | POA: Insufficient documentation

## 2014-01-28 DIAGNOSIS — I10 Essential (primary) hypertension: Secondary | ICD-10-CM | POA: Insufficient documentation

## 2014-01-28 DIAGNOSIS — Z87891 Personal history of nicotine dependence: Secondary | ICD-10-CM | POA: Insufficient documentation

## 2014-01-28 DIAGNOSIS — I484 Atypical atrial flutter: Secondary | ICD-10-CM | POA: Insufficient documentation

## 2014-01-28 DIAGNOSIS — I671 Cerebral aneurysm, nonruptured: Secondary | ICD-10-CM | POA: Diagnosis not present

## 2014-01-28 DIAGNOSIS — Z7901 Long term (current) use of anticoagulants: Secondary | ICD-10-CM | POA: Diagnosis not present

## 2014-01-28 DIAGNOSIS — Z882 Allergy status to sulfonamides status: Secondary | ICD-10-CM | POA: Insufficient documentation

## 2014-01-28 SURGERY — Surgical Case

## 2014-01-28 MED ORDER — SODIUM CHLORIDE 0.9 % IV SOLN: INTRAVENOUS | Status: DC

## 2014-01-28 NOTE — Progress Notes (Signed)
Late Entry: Patient is Sinus when hooked up to the monitor. 12 lead EKG done paged Dr Wynonia Lawman with verbal order to sent patient home.

## 2014-01-28 NOTE — Anesthesia Preprocedure Evaluation (Deleted)
Anesthesia Evaluation  Patient identified by MRN, date of birth, ID band Patient confused and Patient unresponsive    Airway Mallampati: I      Dental   Pulmonary  breath sounds clear to auscultation        Cardiovascular hypertension, Rhythm:Irregular Rate:Normal     Neuro/Psych    GI/Hepatic   Endo/Other    Renal/GU      Musculoskeletal   Abdominal   Peds  Hematology   Anesthesia Other Findings   Reproductive/Obstetrics                           Anesthesia Physical Anesthesia Plan  ASA: III  Anesthesia Plan: General   Post-op Pain Management:    Induction: Intravenous  Airway Management Planned: Mask  Additional Equipment:   Intra-op Plan:   Post-operative Plan:   Informed Consent: I have reviewed the patients History and Physical, chart, labs and discussed the procedure including the risks, benefits and alternatives for the proposed anesthesia with the patient or authorized representative who has indicated his/her understanding and acceptance.   Dental advisory given  Plan Discussed with: CRNA and Surgeon  Anesthesia Plan Comments:         Anesthesia Quick Evaluation

## 2014-01-30 DIAGNOSIS — B078 Other viral warts: Secondary | ICD-10-CM | POA: Diagnosis not present

## 2014-01-30 DIAGNOSIS — L821 Other seborrheic keratosis: Secondary | ICD-10-CM | POA: Diagnosis not present

## 2014-01-30 DIAGNOSIS — L570 Actinic keratosis: Secondary | ICD-10-CM | POA: Diagnosis not present

## 2014-01-30 DIAGNOSIS — D225 Melanocytic nevi of trunk: Secondary | ICD-10-CM | POA: Diagnosis not present

## 2014-02-04 DIAGNOSIS — I484 Atypical atrial flutter: Secondary | ICD-10-CM | POA: Diagnosis not present

## 2014-02-04 DIAGNOSIS — Z7901 Long term (current) use of anticoagulants: Secondary | ICD-10-CM | POA: Diagnosis not present

## 2014-02-04 DIAGNOSIS — I4891 Unspecified atrial fibrillation: Secondary | ICD-10-CM | POA: Diagnosis not present

## 2014-02-14 DIAGNOSIS — I10 Essential (primary) hypertension: Secondary | ICD-10-CM | POA: Diagnosis not present

## 2014-02-14 DIAGNOSIS — Z79899 Other long term (current) drug therapy: Secondary | ICD-10-CM | POA: Diagnosis not present

## 2014-02-14 DIAGNOSIS — Z0001 Encounter for general adult medical examination with abnormal findings: Secondary | ICD-10-CM | POA: Diagnosis not present

## 2014-02-14 DIAGNOSIS — Z136 Encounter for screening for cardiovascular disorders: Secondary | ICD-10-CM | POA: Diagnosis not present

## 2014-02-14 DIAGNOSIS — I480 Paroxysmal atrial fibrillation: Secondary | ICD-10-CM | POA: Diagnosis not present

## 2014-02-14 DIAGNOSIS — Z23 Encounter for immunization: Secondary | ICD-10-CM | POA: Diagnosis not present

## 2014-02-14 DIAGNOSIS — M109 Gout, unspecified: Secondary | ICD-10-CM | POA: Diagnosis not present

## 2014-02-14 DIAGNOSIS — R7309 Other abnormal glucose: Secondary | ICD-10-CM | POA: Diagnosis not present

## 2014-05-14 DIAGNOSIS — I480 Paroxysmal atrial fibrillation: Secondary | ICD-10-CM | POA: Diagnosis not present

## 2014-07-09 DIAGNOSIS — Z961 Presence of intraocular lens: Secondary | ICD-10-CM | POA: Diagnosis not present

## 2014-07-09 DIAGNOSIS — H43813 Vitreous degeneration, bilateral: Secondary | ICD-10-CM | POA: Diagnosis not present

## 2014-07-09 DIAGNOSIS — H3531 Nonexudative age-related macular degeneration: Secondary | ICD-10-CM | POA: Diagnosis not present

## 2014-09-11 DIAGNOSIS — I10 Essential (primary) hypertension: Secondary | ICD-10-CM | POA: Diagnosis not present

## 2014-09-11 DIAGNOSIS — Z7901 Long term (current) use of anticoagulants: Secondary | ICD-10-CM | POA: Diagnosis not present

## 2014-09-11 DIAGNOSIS — I484 Atypical atrial flutter: Secondary | ICD-10-CM | POA: Diagnosis not present

## 2015-02-19 DIAGNOSIS — Z0000 Encounter for general adult medical examination without abnormal findings: Secondary | ICD-10-CM | POA: Diagnosis not present

## 2015-02-19 DIAGNOSIS — Z23 Encounter for immunization: Secondary | ICD-10-CM | POA: Diagnosis not present

## 2015-02-19 DIAGNOSIS — Z79899 Other long term (current) drug therapy: Secondary | ICD-10-CM | POA: Diagnosis not present

## 2015-02-19 DIAGNOSIS — M109 Gout, unspecified: Secondary | ICD-10-CM | POA: Diagnosis not present

## 2015-02-19 DIAGNOSIS — I4891 Unspecified atrial fibrillation: Secondary | ICD-10-CM | POA: Diagnosis not present

## 2015-02-19 DIAGNOSIS — I10 Essential (primary) hypertension: Secondary | ICD-10-CM | POA: Diagnosis not present

## 2015-02-24 DIAGNOSIS — L821 Other seborrheic keratosis: Secondary | ICD-10-CM | POA: Diagnosis not present

## 2015-02-24 DIAGNOSIS — L738 Other specified follicular disorders: Secondary | ICD-10-CM | POA: Diagnosis not present

## 2015-02-24 DIAGNOSIS — L812 Freckles: Secondary | ICD-10-CM | POA: Diagnosis not present

## 2015-02-24 DIAGNOSIS — B078 Other viral warts: Secondary | ICD-10-CM | POA: Diagnosis not present

## 2015-02-24 DIAGNOSIS — C4441 Basal cell carcinoma of skin of scalp and neck: Secondary | ICD-10-CM | POA: Diagnosis not present

## 2015-02-24 DIAGNOSIS — L739 Follicular disorder, unspecified: Secondary | ICD-10-CM | POA: Diagnosis not present

## 2015-02-24 DIAGNOSIS — D485 Neoplasm of uncertain behavior of skin: Secondary | ICD-10-CM | POA: Diagnosis not present

## 2015-02-24 DIAGNOSIS — B079 Viral wart, unspecified: Secondary | ICD-10-CM | POA: Diagnosis not present

## 2015-02-24 DIAGNOSIS — D1801 Hemangioma of skin and subcutaneous tissue: Secondary | ICD-10-CM | POA: Diagnosis not present

## 2015-02-24 DIAGNOSIS — D225 Melanocytic nevi of trunk: Secondary | ICD-10-CM | POA: Diagnosis not present

## 2015-03-18 DIAGNOSIS — Z7901 Long term (current) use of anticoagulants: Secondary | ICD-10-CM | POA: Diagnosis not present

## 2015-03-18 DIAGNOSIS — I10 Essential (primary) hypertension: Secondary | ICD-10-CM | POA: Diagnosis not present

## 2015-03-18 DIAGNOSIS — I484 Atypical atrial flutter: Secondary | ICD-10-CM | POA: Diagnosis not present

## 2015-03-18 DIAGNOSIS — I480 Paroxysmal atrial fibrillation: Secondary | ICD-10-CM | POA: Diagnosis not present

## 2015-07-22 DIAGNOSIS — H353131 Nonexudative age-related macular degeneration, bilateral, early dry stage: Secondary | ICD-10-CM | POA: Diagnosis not present

## 2015-07-22 DIAGNOSIS — Z961 Presence of intraocular lens: Secondary | ICD-10-CM | POA: Diagnosis not present

## 2015-10-20 DIAGNOSIS — I10 Essential (primary) hypertension: Secondary | ICD-10-CM | POA: Diagnosis not present

## 2015-10-20 DIAGNOSIS — Z7901 Long term (current) use of anticoagulants: Secondary | ICD-10-CM | POA: Diagnosis not present

## 2015-10-20 DIAGNOSIS — I484 Atypical atrial flutter: Secondary | ICD-10-CM | POA: Diagnosis not present

## 2015-10-20 DIAGNOSIS — I480 Paroxysmal atrial fibrillation: Secondary | ICD-10-CM | POA: Diagnosis not present

## 2015-10-24 DIAGNOSIS — I480 Paroxysmal atrial fibrillation: Secondary | ICD-10-CM | POA: Diagnosis not present

## 2015-10-27 ENCOUNTER — Observation Stay (HOSPITAL_COMMUNITY)
Admission: RE | Admit: 2015-10-27 | Payer: Federal, State, Local not specified - PPO | Source: Ambulatory Visit | Admitting: Cardiology

## 2016-01-20 DIAGNOSIS — R748 Abnormal levels of other serum enzymes: Secondary | ICD-10-CM | POA: Diagnosis not present

## 2016-01-20 DIAGNOSIS — K439 Ventral hernia without obstruction or gangrene: Secondary | ICD-10-CM | POA: Diagnosis not present

## 2016-01-27 ENCOUNTER — Other Ambulatory Visit: Payer: Self-pay | Admitting: Gastroenterology

## 2016-01-27 DIAGNOSIS — C220 Liver cell carcinoma: Secondary | ICD-10-CM

## 2016-01-27 DIAGNOSIS — Z23 Encounter for immunization: Secondary | ICD-10-CM | POA: Diagnosis not present

## 2016-02-05 ENCOUNTER — Ambulatory Visit
Admission: RE | Admit: 2016-02-05 | Discharge: 2016-02-05 | Disposition: A | Discharge status: Home / Self Care | Payer: Medicare Other | Source: Ambulatory Visit | Attending: Gastroenterology | Admitting: Gastroenterology

## 2016-02-05 DIAGNOSIS — C220 Liver cell carcinoma: Secondary | ICD-10-CM

## 2016-02-29 IMAGING — CR DG CHEST 2V
2 series · 2 of 2 positions shown · non-contrast
Comparison: 08/02/2011

CLINICAL DATA: Irregular heartbeat, preop for cardioversion

EXAM:
CHEST  2 VIEW

[w chest pa]
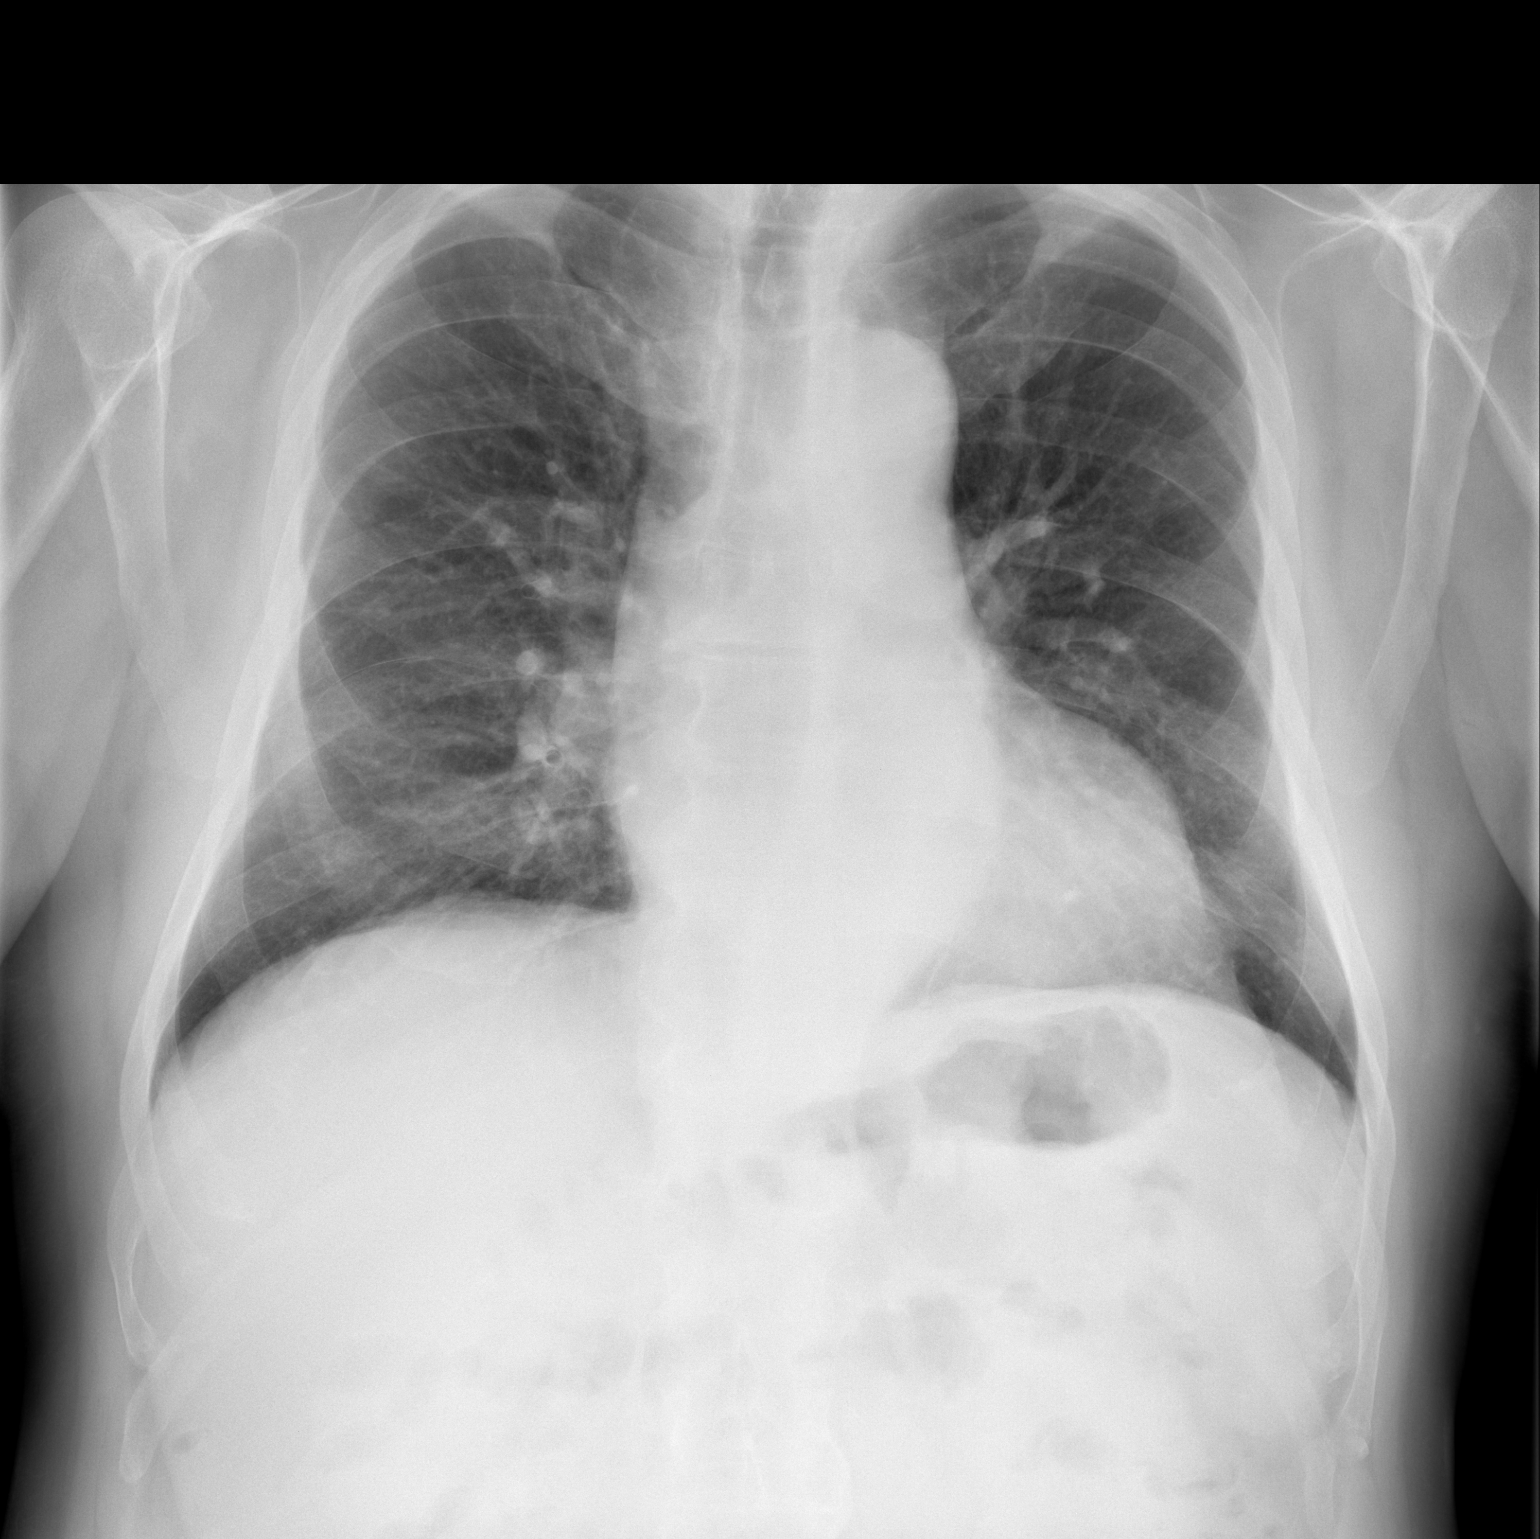

[w chest lat]
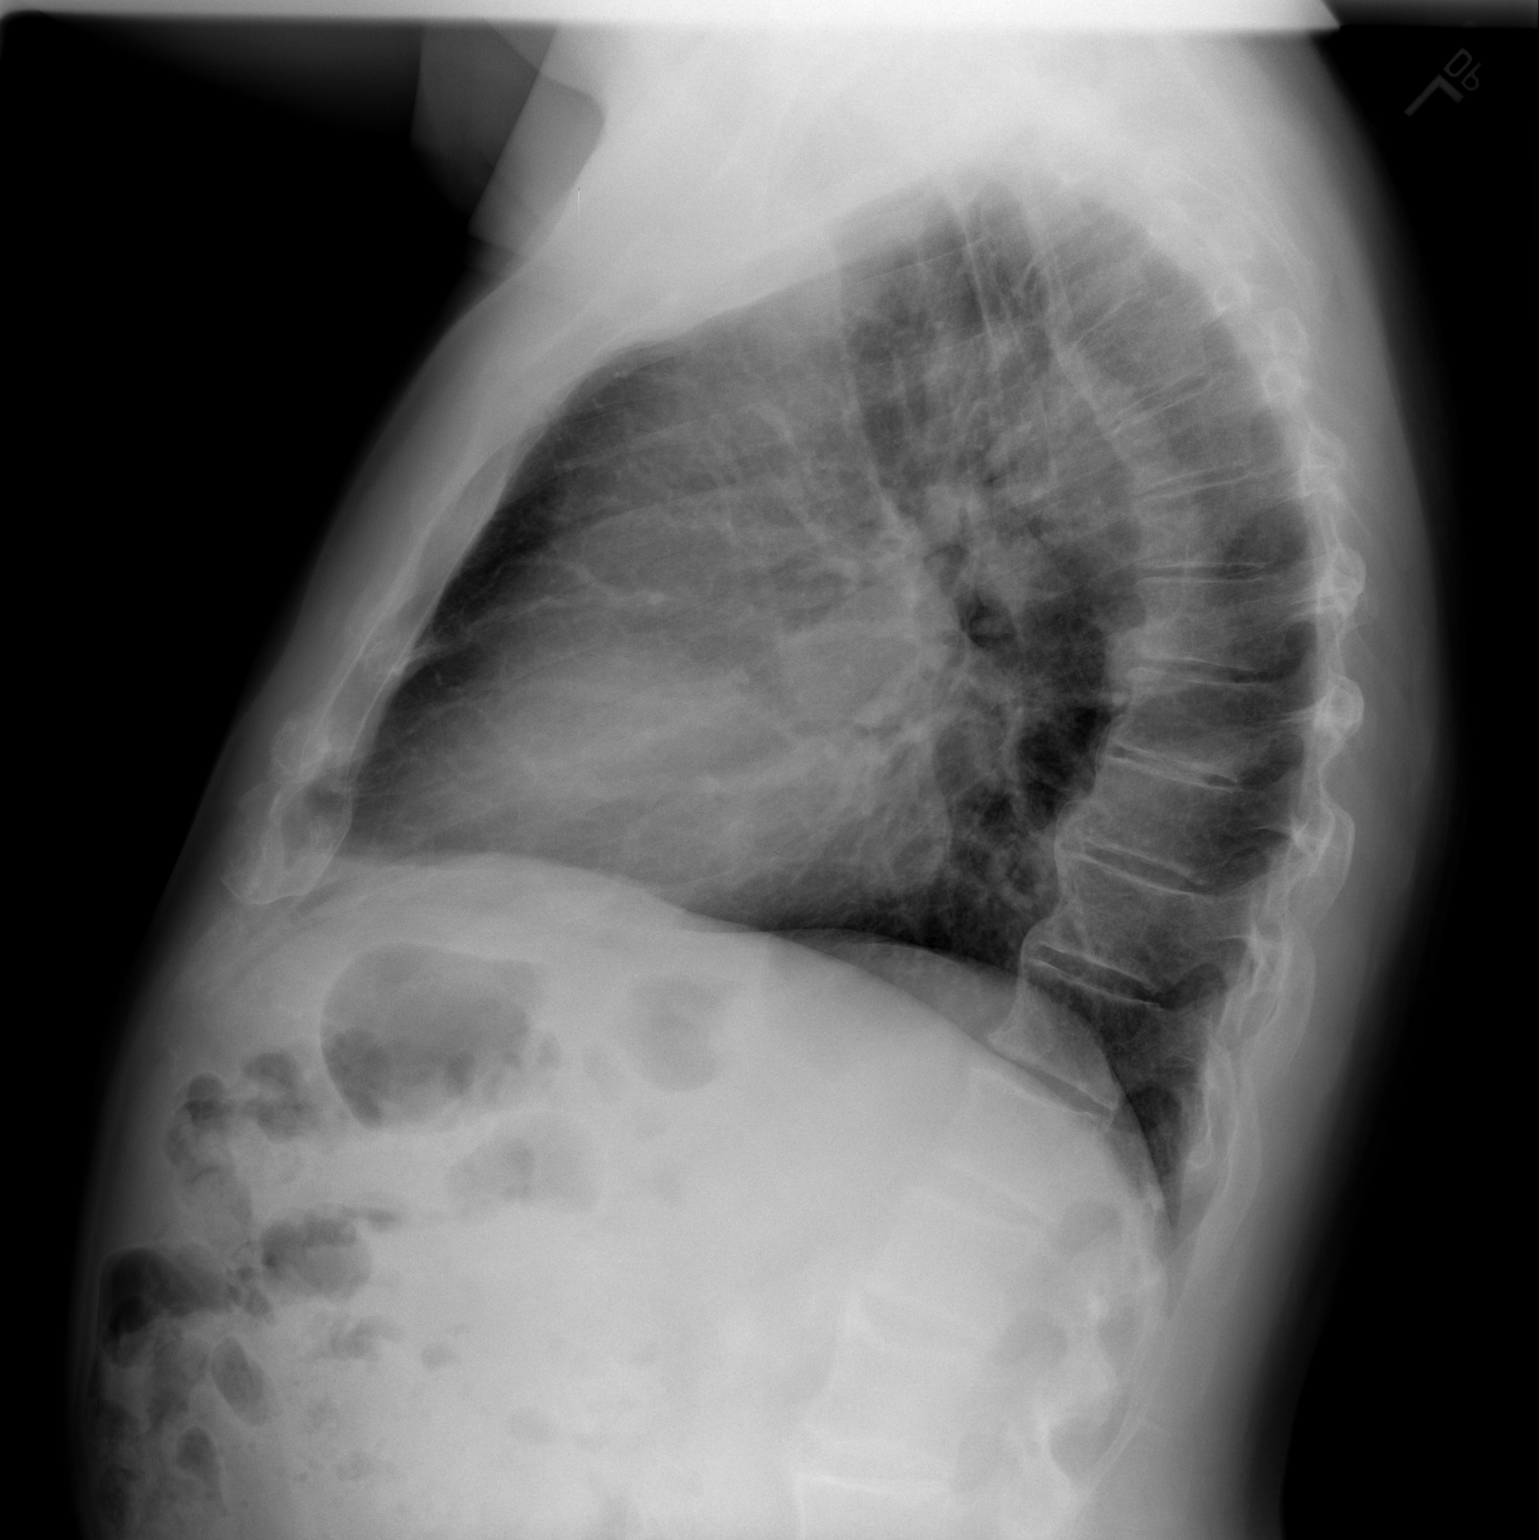

[2 of 2 positions shown; findings below may reference images not displayed]

FINDINGS: Cardiomediastinal silhouette is stable. No acute infiltrate or
pleural effusion. No pulmonary edema. Mild degenerative changes
thoracic spine.
IMPRESSION: No active cardiopulmonary disease.

## 2016-03-03 DIAGNOSIS — D225 Melanocytic nevi of trunk: Secondary | ICD-10-CM | POA: Diagnosis not present

## 2016-03-03 DIAGNOSIS — L821 Other seborrheic keratosis: Secondary | ICD-10-CM | POA: Diagnosis not present

## 2016-03-03 DIAGNOSIS — B079 Viral wart, unspecified: Secondary | ICD-10-CM | POA: Diagnosis not present

## 2016-03-03 DIAGNOSIS — L570 Actinic keratosis: Secondary | ICD-10-CM | POA: Diagnosis not present

## 2016-03-03 DIAGNOSIS — Z85828 Personal history of other malignant neoplasm of skin: Secondary | ICD-10-CM | POA: Diagnosis not present

## 2016-03-03 DIAGNOSIS — L814 Other melanin hyperpigmentation: Secondary | ICD-10-CM | POA: Diagnosis not present

## 2016-03-09 DIAGNOSIS — Z0000 Encounter for general adult medical examination without abnormal findings: Secondary | ICD-10-CM | POA: Diagnosis not present

## 2016-03-09 DIAGNOSIS — Z79899 Other long term (current) drug therapy: Secondary | ICD-10-CM | POA: Diagnosis not present

## 2016-03-09 DIAGNOSIS — N5201 Erectile dysfunction due to arterial insufficiency: Secondary | ICD-10-CM | POA: Diagnosis not present

## 2016-03-09 DIAGNOSIS — M109 Gout, unspecified: Secondary | ICD-10-CM | POA: Diagnosis not present

## 2016-03-09 DIAGNOSIS — E7800 Pure hypercholesterolemia, unspecified: Secondary | ICD-10-CM | POA: Diagnosis not present

## 2016-03-09 DIAGNOSIS — R7301 Impaired fasting glucose: Secondary | ICD-10-CM | POA: Diagnosis not present

## 2016-03-09 DIAGNOSIS — I4892 Unspecified atrial flutter: Secondary | ICD-10-CM | POA: Diagnosis not present

## 2016-03-09 DIAGNOSIS — I10 Essential (primary) hypertension: Secondary | ICD-10-CM | POA: Diagnosis not present

## 2016-06-16 DIAGNOSIS — R7303 Prediabetes: Secondary | ICD-10-CM | POA: Diagnosis not present

## 2016-06-22 DIAGNOSIS — R7303 Prediabetes: Secondary | ICD-10-CM | POA: Diagnosis not present

## 2016-07-20 DIAGNOSIS — Z961 Presence of intraocular lens: Secondary | ICD-10-CM | POA: Diagnosis not present

## 2016-07-20 DIAGNOSIS — H353131 Nonexudative age-related macular degeneration, bilateral, early dry stage: Secondary | ICD-10-CM | POA: Diagnosis not present

## 2016-07-20 DIAGNOSIS — H1789 Other corneal scars and opacities: Secondary | ICD-10-CM | POA: Diagnosis not present

## 2016-10-01 DIAGNOSIS — I484 Atypical atrial flutter: Secondary | ICD-10-CM | POA: Diagnosis not present

## 2016-10-01 DIAGNOSIS — Z7901 Long term (current) use of anticoagulants: Secondary | ICD-10-CM | POA: Diagnosis not present

## 2016-10-01 DIAGNOSIS — I480 Paroxysmal atrial fibrillation: Secondary | ICD-10-CM | POA: Diagnosis not present

## 2016-10-04 DIAGNOSIS — I671 Cerebral aneurysm, nonruptured: Secondary | ICD-10-CM | POA: Diagnosis not present

## 2016-10-04 DIAGNOSIS — I10 Essential (primary) hypertension: Secondary | ICD-10-CM | POA: Diagnosis not present

## 2016-10-04 DIAGNOSIS — Z6829 Body mass index (BMI) 29.0-29.9, adult: Secondary | ICD-10-CM | POA: Diagnosis not present

## 2016-10-08 ENCOUNTER — Other Ambulatory Visit (HOSPITAL_COMMUNITY): Payer: Self-pay | Admitting: Neurosurgery

## 2016-10-08 DIAGNOSIS — I671 Cerebral aneurysm, nonruptured: Secondary | ICD-10-CM

## 2016-10-21 DIAGNOSIS — R7303 Prediabetes: Secondary | ICD-10-CM | POA: Diagnosis not present

## 2016-11-08 ENCOUNTER — Other Ambulatory Visit: Payer: Self-pay | Admitting: Radiology

## 2016-11-09 ENCOUNTER — Ambulatory Visit (HOSPITAL_COMMUNITY)
Admission: RE | Admit: 2016-11-09 | Discharge: 2016-11-09 | Disposition: A | Discharge status: Home / Self Care | Payer: Medicare Other | Source: Ambulatory Visit | Attending: Neurosurgery | Admitting: Neurosurgery

## 2016-11-09 ENCOUNTER — Other Ambulatory Visit (HOSPITAL_COMMUNITY): Payer: Self-pay | Admitting: Neurosurgery

## 2016-11-09 ENCOUNTER — Encounter (HOSPITAL_COMMUNITY): Payer: Self-pay

## 2016-11-09 DIAGNOSIS — Z7901 Long term (current) use of anticoagulants: Secondary | ICD-10-CM | POA: Insufficient documentation

## 2016-11-09 DIAGNOSIS — I671 Cerebral aneurysm, nonruptured: Secondary | ICD-10-CM | POA: Diagnosis not present

## 2016-11-09 DIAGNOSIS — I10 Essential (primary) hypertension: Secondary | ICD-10-CM | POA: Insufficient documentation

## 2016-11-09 DIAGNOSIS — Z48812 Encounter for surgical aftercare following surgery on the circulatory system: Secondary | ICD-10-CM | POA: Diagnosis not present

## 2016-11-09 DIAGNOSIS — Z87891 Personal history of nicotine dependence: Secondary | ICD-10-CM | POA: Diagnosis not present

## 2016-11-09 DIAGNOSIS — Z882 Allergy status to sulfonamides status: Secondary | ICD-10-CM | POA: Insufficient documentation

## 2016-11-09 DIAGNOSIS — I4891 Unspecified atrial fibrillation: Secondary | ICD-10-CM | POA: Diagnosis not present

## 2016-11-09 DIAGNOSIS — M109 Gout, unspecified: Secondary | ICD-10-CM | POA: Diagnosis not present

## 2016-11-09 HISTORY — PX: IR ANGIO INTRA EXTRACRAN SEL COM CAROTID INNOMINATE UNI L MOD SED: IMG5358

## 2016-11-09 HISTORY — PX: IR ANGIO INTRA EXTRACRAN SEL INTERNAL CAROTID UNI R MOD SED: IMG5362

## 2016-11-09 LAB — BASIC METABOLIC PANEL
Anion gap: 8 (ref 5–15)
BUN: 15 mg/dL (ref 6–20)
CO2: 25 mmol/L (ref 22–32)
Calcium: 8.8 mg/dL — ABNORMAL LOW (ref 8.9–10.3)
Chloride: 105 mmol/L (ref 101–111)
Creatinine, Ser: 0.73 mg/dL (ref 0.61–1.24)
GFR calc Af Amer: >60 mL/min (ref >60)
GFR calc non Af Amer: >60 mL/min (ref >60)
Glucose, Bld: 138 mg/dL — ABNORMAL HIGH (ref 65–99)
Potassium: 3.8 mmol/L (ref 3.5–5.1)
Sodium: 138 mmol/L (ref 135–145)

## 2016-11-09 LAB — CBC
HEMATOCRIT: 47.1 % (ref 39.0–52.0)
Hemoglobin: 16.3 g/dL (ref 13.0–17.0)
MCH: 32.7 pg (ref 26.0–34.0)
MCHC: 34.6 g/dL (ref 30.0–36.0)
MCV: 94.4 fL (ref 78.0–100.0)
Platelets: 171 10*3/uL (ref 150–400)
RBC: 4.99 MIL/uL (ref 4.22–5.81)
RDW: 13.1 % (ref 11.5–15.5)
WBC: 6.5 10*3/uL (ref 4.0–10.5)

## 2016-11-09 LAB — APTT: aPTT: 29 seconds (ref 24–36)

## 2016-11-09 LAB — PROTIME-INR
INR: 1.1
Prothrombin Time: 14.2 seconds (ref 11.4–15.2)

## 2016-11-09 MED ORDER — HEPARIN SODIUM (PORCINE) 1000 UNIT/ML IJ SOLN
INTRAMUSCULAR | Status: AC
  Filled 2016-11-09: qty 2

## 2016-11-09 MED ORDER — FENTANYL CITRATE (PF) 100 MCG/2ML IJ SOLN
INTRAMUSCULAR | Status: AC
  Filled 2016-11-09: qty 2

## 2016-11-09 MED ORDER — MIDAZOLAM HCL 2 MG/2ML IJ SOLN
INTRAMUSCULAR | Status: AC | PRN
  Administered 2016-11-09: 1 mg via INTRAVENOUS

## 2016-11-09 MED ORDER — FENTANYL CITRATE (PF) 100 MCG/2ML IJ SOLN
INTRAMUSCULAR | Status: AC | PRN
  Administered 2016-11-09: 25 ug via INTRAVENOUS

## 2016-11-09 MED ORDER — SODIUM CHLORIDE 0.9 % IV SOLN
INTRAVENOUS | Status: AC | PRN
  Administered 2016-11-09: 10 mL/h via INTRAVENOUS

## 2016-11-09 MED ORDER — LIDOCAINE HCL (PF) 1 % IJ SOLN
INTRAMUSCULAR | Status: AC | PRN
  Administered 2016-11-09: 10 mL

## 2016-11-09 MED ORDER — MIDAZOLAM HCL 2 MG/2ML IJ SOLN
INTRAMUSCULAR | Status: AC
  Filled 2016-11-09: qty 2

## 2016-11-09 MED ORDER — HEPARIN SODIUM (PORCINE) 1000 UNIT/ML IJ SOLN
INTRAMUSCULAR | Status: AC | PRN
  Administered 2016-11-09: 2000 [IU] via INTRAVENOUS

## 2016-11-09 MED ORDER — LIDOCAINE HCL (PF) 1 % IJ SOLN
INTRAMUSCULAR | Status: AC
  Filled 2016-11-09: qty 30

## 2016-11-09 MED ORDER — SODIUM CHLORIDE 0.9 % IV SOLN: INTRAVENOUS | Status: DC

## 2016-11-09 MED ORDER — IOPAMIDOL (ISOVUE-300) INJECTION 61%
INTRAVENOUS | Status: AC
  Administered 2016-11-09: 35 mL
  Filled 2016-11-09: qty 150

## 2016-11-09 MED ORDER — HYDROCODONE-ACETAMINOPHEN 5-325 MG PO TABS: 1.0000 | ORAL_TABLET | ORAL | Status: DC | PRN

## 2016-11-09 NOTE — Sedation Documentation (Signed)
Dressing to right groin dry and intact. Pulses present bilaterally

## 2016-11-09 NOTE — Sedation Documentation (Signed)
Called to give report. [facility] with no available beds. Will call back when bed available.

## 2016-11-09 NOTE — Sedation Documentation (Signed)
Dressing to right groin intact. Pulses present bilaterally.

## 2016-11-09 NOTE — H&P (Signed)
Chief Complaint  Aneurysm  History of Present Illness  Randy Evans is a 76 y.o. male with a history of Acom aneurysm coiled electively at [facility] about 63yrs ago. He has not had any angiographic f/u since then. He has not had any new neurologic symptoms.  Past Medical History   Past Medical History:  Diagnosis Date  . Atrial fibrillation ([place])    Onset 1991 in [place]  . Cerebral aneurysm without rupture    treated with coil embolization 2006  . Gout   . Hemochromatosis    Has seen Teena Irani  . Hypertension     Past Surgical History   Past Surgical History:  Procedure Laterality Date  . ANEURYSM COILING  2006  . bone spur removal  1951   below left knee  . BRAIN SURGERY    . CARDIAC ELECTROPHYSIOLOGY MAPPING AND ABLATION  01/2008; 07/2008  . CARDIOVERSION  2000; 2008 03/2008; ;02/15/2011   2008; 2009 2012:    Surgeon: Kerry Hough., MD;  Location: [facility];  Service: Cardiovascular;  Laterality: N/A;  . CARDIOVERSION  08/05/2011   Procedure: CARDIOVERSION;  Surgeon: Jacolyn Reedy, MD;  Location: [place];  Service: Cardiovascular;  Laterality: N/A;  . CATARACT EXTRACTION W/ INTRAOCULAR LENS  IMPLANT, BILATERAL  2009-2010  . LIVER BIOPSY  late 1990's  . TOE SURGERY      Social History   Social History  Substance Use Topics  . Smoking status: Former Smoker    Years: 4.00    Types: Cigarettes, Pipe, Cigars  . Smokeless tobacco: Never Used     Comment: quit smoking 1966  . Alcohol use 1.2 oz/week    2 Cans of beer per week    Medications   Prior to Admission medications   Medication Sig Start Date End Date Taking? Authorizing Provider  acetaminophen (TYLENOL) 500 MG tablet Take 1,000 mg by mouth every 6 (six) hours as needed. pain    Yes [provider]  allopurinol (ZYLOPRIM) 300 MG tablet Take 300 mg by mouth daily.     Yes [provider]  amLODipine (NORVASC) 5 MG tablet Take 5 mg by mouth daily.     Yes [provider]  apixaban  (ELIQUIS) 5 MG TABS tablet Take 5 mg by mouth 2 (two) times daily.   Yes [provider]  chlorpheniramine (CHLOR-TRIMETON) 4 MG tablet Take 4 mg by mouth 2 (two) times daily as needed. allergies    Yes [provider]  Coenzyme Q10 100 MG capsule Take 100 mg by mouth daily.   Yes [provider]  dofetilide (TIKOSYN) 250 MCG capsule Take 250 mcg by mouth 2 (two) times daily.   Yes [provider]  losartan (COZAAR) 100 MG tablet Take 100 mg by mouth daily.    Yes [provider]  metoprolol succinate (TOPROL-XL) 50 MG 24 hr tablet Take 50 mg by mouth daily. Take with or immediately following a meal.   Yes [provider]  multivitamin-lutein (OCUVITE-LUTEIN) CAPS Take 1 capsule by mouth daily.     Yes [provider]    Allergies   Allergies  Allergen Reactions  . Codeine Itching  . Sulfa Antibiotics Other (See Comments)    Reactions: nausea, shaking of the body    Review of Systems  ROS  Neurologic Exam  Awake, alert, oriented Memory and concentration grossly intact Speech fluent, appropriate CN grossly intact Motor exam: Upper Extremities Deltoid Bicep Tricep Grip  Right 5/5  5/5 5/5 5/5  Left 5/5 5/5 5/5 5/5   Lower Extremities IP Quad PF DF EHL  Right 5/5 5/5 5/5 5/5 5/5  Left 5/5 5/5 5/5 5/5 5/5   Sensation grossly intact to LT  Impression  - 76 y.o. male >10 yrs s/p coiling of Acom aneurysm without known angiographic f/u  Plan  - Proceed with diagnostic cerebral angiogram  I have reviewed the indications, risks, benefits, and alternatives to the angiogram with the patient in the office. All questions were answered and consent was obtained.

## 2016-11-09 NOTE — Sedation Documentation (Signed)
5 Fr. Exoseal to right groin 

## 2016-11-09 NOTE — Sedation Documentation (Signed)
Dressing right groin dry and intact. Pulses present bilaterally

## 2016-11-09 NOTE — Discharge Instructions (Addendum)
Cerebral Angiogram, Care After °Refer to this sheet in the next few weeks. These instructions provide you with information on caring for yourself after your procedure. Your health care provider may also give you more specific instructions. Your treatment has been planned according to current medical practices, but problems sometimes occur. Call your health care provider if you have any problems or questions after your procedure. °What can I expect after the procedure? °After your procedure, it is typical to have the following: °· Bruising at the catheter insertion site that usually fades within 1-2 weeks. °· Blood collecting in the tissue (hematoma) that may be painful to the touch. It should usually decrease in size and tenderness within 1-2 weeks. °· A mild headache. ° °Follow these instructions at home: °· Take medicines only as directed by your health care provider. °· You may shower 24-48 hours after the procedure or as directed by your health care provider. Remove the bandage (dressing) and gently wash the site with plain soap and water. Pat the area dry with a clean towel. Do not rub the site, because this may cause bleeding. °· Do not take baths, swim, or use a hot tub until your health care provider approves. °· Check your insertion site every day for redness, swelling, or drainage. °· Do not apply powder or lotion to the site. °· Do not lift over 10 lb (4.5 kg) for 5 days after your procedure or as directed by your health care provider. °· Ask your health care provider when it is okay to: °? Return to work or school. °? Resume usual physical activities or sports. °? Resume sexual activity. °· Do not drive home if you are discharged the same day as the procedure. Have someone else drive you. °· You may drive 24 hours after the procedure unless otherwise instructed by your health care provider. °· Do not operate machinery or power tools for 24 hours after the procedure or as directed by your health care  provider. °· If your procedure was done as an outpatient procedure, which means that you went home the same day as your procedure, a responsible adult should be with you for the first 24 hours after you arrive home. °· Keep all follow-up visits as directed by your health care provider. This is important. °Contact a health care provider if: °· You have a fever. °· You have chills. °· You have increased bleeding from the catheter insertion site. Hold pressure on the site. °Get help right away if: °· You have vision changes or loss of vision. °· You have numbness or weakness on one side of your body. °· You have difficulty talking, or you have slurred speech or cannot speak (aphasia). °· You feel confused or have difficulty remembering. °· You have unusual pain at the catheter insertion site. °· You have redness, warmth, or swelling at the catheter insertion site. °· You have drainage (other than a small amount of blood on the dressing) from the catheter insertion site. °· The catheter insertion site is bleeding, and the bleeding does not stop after 30 minutes of holding steady pressure on the site. °These symptoms may represent a serious problem that is an emergency. Do not wait to see if the symptoms will go away. Get medical help right away. Call your local emergency services (911 in [place] Do not drive yourself to the hospital. °This information is not intended to replace advice given to you by your health care provider. Make sure you discuss any questions   you have with your health care provider. °Document Released: 08/06/2013 Document Revised: 08/28/2015 Document Reviewed: 04/04/2013 °Elsevier Interactive Patient Education © 2017 [employer] °Moderate Conscious Sedation, Adult, Care After °These instructions provide you with information about caring for yourself after your procedure. Your health care provider may also give you more specific instructions. Your treatment has been planned according to current  medical practices, but problems sometimes occur. Call your health care provider if you have any problems or questions after your procedure. °What can I expect after the procedure? °After your procedure, it is common: °· To feel sleepy for several hours. °· To feel clumsy and have poor balance for several hours. °· To have poor judgment for several hours. °· To vomit if you eat too soon. ° °Follow these instructions at home: °For at least 24 hours after the procedure: ° °· Do not: °? Participate in activities where you could fall or become injured. °? Drive. °? Use heavy machinery. °? Drink alcohol. °? Take sleeping pills or medicines that cause drowsiness. °? Make important decisions or sign legal documents. °? Take care of children on your own. °· Rest. °Eating and drinking °· Follow the diet recommended by your health care provider. °· If you vomit: °? Drink water, juice, or soup when you can drink without vomiting. °? Make sure you have little or no nausea before eating solid foods. °General instructions °· Have a responsible adult stay with you until you are awake and alert. °· Take over-the-counter and prescription medicines only as told by your health care provider. °· If you smoke, do not smoke without supervision. °· Keep all follow-up visits as told by your health care provider. This is important. °Contact a health care provider if: °· You keep feeling nauseous or you keep vomiting. °· You feel light-headed. °· You develop a rash. °· You have a fever. °Get help right away if: °· You have trouble breathing. °This information is not intended to replace advice given to you by your health care provider. Make sure you discuss any questions you have with your health care provider. °Document Released: 01/10/2013 Document Revised: 08/25/2015 Document Reviewed: 07/12/2015 °Elsevier Interactive Patient Education © 2018 [employer] ° °

## 2016-11-15 DIAGNOSIS — I10 Essential (primary) hypertension: Secondary | ICD-10-CM | POA: Diagnosis not present

## 2016-11-15 DIAGNOSIS — I671 Cerebral aneurysm, nonruptured: Secondary | ICD-10-CM | POA: Diagnosis not present

## 2016-11-15 DIAGNOSIS — Z6829 Body mass index (BMI) 29.0-29.9, adult: Secondary | ICD-10-CM | POA: Diagnosis not present

## 2016-12-31 DIAGNOSIS — Z23 Encounter for immunization: Secondary | ICD-10-CM | POA: Diagnosis not present

## 2017-01-21 ENCOUNTER — Encounter: Payer: Self-pay | Admitting: Podiatry

## 2017-01-21 ENCOUNTER — Ambulatory Visit (INDEPENDENT_AMBULATORY_CARE_PROVIDER_SITE_OTHER): Payer: Medicare Other

## 2017-01-21 ENCOUNTER — Ambulatory Visit (INDEPENDENT_AMBULATORY_CARE_PROVIDER_SITE_OTHER): Payer: Medicare Other | Admitting: Podiatry

## 2017-01-21 VITALS — BP 141/80 | HR 64 | Resp 18

## 2017-01-21 DIAGNOSIS — M722 Plantar fascial fibromatosis: Secondary | ICD-10-CM

## 2017-01-21 NOTE — Progress Notes (Signed)
Subjective:    Patient ID: Randy Evans, male    DOB: 1940/05/23, 76 y.o.   MRN: [id number]  HPI  Mr. Loftus presents to the office today for concerns of right heel pain which is been ongoing intermittent since the summer. He states he gets pain in the morning when he gets up if its ensuing for sometime stands backup. He denies a numbness or tingling. He denies any swelling. No recent injury or trauma. Senna recent treatment for this. The pain does not wake him up at night. He has no other concerns today.  He has a history of bunion surgery on the right foot, no complaints to this.   Review of Systems  All other systems reviewed and are negative.  Past Medical History:  Diagnosis Date  . Atrial fibrillation ([place])    Onset 1991 in [place]  . Cerebral aneurysm without rupture    treated with coil embolization 2006  . Gout   . Hemochromatosis    Has seen Teena Irani  . Hypertension     Past Surgical History:  Procedure Laterality Date  . ANEURYSM COILING  2006  . bone spur removal  1951   below left knee  . BRAIN SURGERY    . CARDIAC ELECTROPHYSIOLOGY MAPPING AND ABLATION  01/2008; 07/2008  . CARDIOVERSION  2000; 2008 03/2008; ;02/15/2011   2008; 2009 2012:    Surgeon: Kerry Hough., MD;  Location: [facility];  Service: Cardiovascular;  Laterality: N/A;  . CARDIOVERSION  08/05/2011   Procedure: CARDIOVERSION;  Surgeon: Jacolyn Reedy, MD;  Location: [place];  Service: Cardiovascular;  Laterality: N/A;  . CATARACT EXTRACTION W/ INTRAOCULAR LENS  IMPLANT, BILATERAL  2009-2010  . IR ANGIO INTRA EXTRACRAN SEL COM CAROTID INNOMINATE UNI L MOD SED  11/09/2016  . IR ANGIO INTRA EXTRACRAN SEL INTERNAL CAROTID UNI R MOD SED  11/09/2016  . LIVER BIOPSY  late 1990's  . TOE SURGERY       Current Outpatient Prescriptions:  .  acetaminophen (TYLENOL) 500 MG tablet, Take 1,000 mg by mouth every 6 (six) hours as needed. pain , Disp: , Rfl:  .  allopurinol (ZYLOPRIM) 300 MG tablet, Take 300  mg by mouth daily.  , Disp: , Rfl:  .  amLODipine (NORVASC) 5 MG tablet, Take 5 mg by mouth daily.  , Disp: , Rfl:  .  apixaban (ELIQUIS) 5 MG TABS tablet, Take 5 mg by mouth 2 (two) times daily., Disp: , Rfl:  .  chlorpheniramine (CHLOR-TRIMETON) 4 MG tablet, Take 4 mg by mouth 2 (two) times daily as needed. allergies , Disp: , Rfl:  .  Coenzyme Q10 100 MG capsule, Take 100 mg by mouth daily., Disp: , Rfl:  .  dofetilide (TIKOSYN) 250 MCG capsule, Take 250 mcg by mouth 2 (two) times daily., Disp: , Rfl:  .  losartan (COZAAR) 100 MG tablet, Take 100 mg by mouth daily. , Disp: , Rfl:  .  metoprolol succinate (TOPROL-XL) 50 MG 24 hr tablet, Take 50 mg by mouth daily. Take with or immediately following a meal., Disp: , Rfl:  .  multivitamin-lutein (OCUVITE-LUTEIN) CAPS, Take 1 capsule by mouth daily.  , Disp: , Rfl:   Allergies  Allergen Reactions  . Codeine Itching  . Sulfa Antibiotics Other (See Comments)    Reactions: nausea, shaking of the body    Social History   Social History  . Marital status: Married    Spouse name: N/A  . Number  of children: N/A  . Years of education: N/A   Occupational History  . Not on file.   Social History Main Topics  . Smoking status: Former Smoker    Years: 4.00    Types: Cigarettes, Pipe, Cigars  . Smokeless tobacco: Never Used     Comment: quit smoking 1966  . Alcohol use 1.2 oz/week    2 Cans of beer per week  . Drug use: No  . Sexual activity: Yes   Other Topics Concern  . Not on file   Social History Narrative   Retired from [employer].  Moved from [place]. Graduate of [employer].  Married.        Objective:   Physical Exam General: AAO x3, NAD  Dermatological: Skin is warm, dry and supple bilateral. Nails x 10 are well manicured; remaining integument appears unremarkable at this time. There are no open sores, no preulcerative lesions, no rash or signs of infection present.  Vascular: Dorsalis Pedis artery and Posterior Tibial artery  pedal pulses are 2/4 bilateral with immedate capillary fill time. Pedal hair growth present. There is no pain with calf compression, swelling, warmth, erythema.   Neruologic: Grossly intact via light touch bilateral. Protective threshold with Semmes Wienstein monofilament intact to all pedal sites bilateral. Negative tinel sign.   Musculoskeletal:  Tenderness to palpation along the plantar medial tubercle of the calcaneus at the insertion of plantar fascia on the right foot. There is no pain along the course of the plantar fascia within the arch of the foot. Plantar fascia appears to be intact. There is no pain with lateral compression of the calcaneus or pain with vibratory sensation. There is no pain along the course or insertion of the achilles tendon. No other areas of tenderness to bilateral lower extremities. Muscular strength 5/5 in all groups tested bilateral.  Gait: Unassisted, Nonantalgic.      Assessment & Plan:  76 year old male right heel pain likely plantar fasciitis. -Treatment options discussed including all alternatives, risks, and complications -Etiology of symptoms were discussed -X-rays were obtained and reviewed with the patient. No evidence of acute fracture identified. Posterior calcaneal spurring is present. Hardware intact and the previous bunion surgery. -Patient elects to proceed with steroid injection into the right heel. Under sterile skin preparation, a total of 2.5cc of kenalog 10, 0.5% Marcaine plain, and 2% lidocaine plain were infiltrated into the symptomatic area without complication. A band-aid was applied. Patient tolerated the injection well without complication. Post-injection care with discussed with the patient. Discussed with the patient to ice the area over the next couple of days to help prevent a steroid flare.  -Plantar fascial brace dispensed -Discussed stretching, icing exercises daily. -Discussed shoe gear modifications and orthotics. -RTC 3 weeks or  sooner if any issues are to arise. Encouraged to call with any questions, concerns, or any changes.    Celesta Gentile, DPM

## 2017-01-21 NOTE — Patient Instructions (Signed)

## 2017-01-24 DIAGNOSIS — M722 Plantar fascial fibromatosis: Secondary | ICD-10-CM | POA: Insufficient documentation

## 2017-02-17 ENCOUNTER — Encounter: Payer: Self-pay | Admitting: Podiatry

## 2017-02-17 ENCOUNTER — Ambulatory Visit (INDEPENDENT_AMBULATORY_CARE_PROVIDER_SITE_OTHER): Payer: Medicare Other | Admitting: Podiatry

## 2017-02-17 DIAGNOSIS — M19271 Secondary osteoarthritis, right ankle and foot: Secondary | ICD-10-CM

## 2017-02-17 DIAGNOSIS — M722 Plantar fascial fibromatosis: Secondary | ICD-10-CM | POA: Diagnosis not present

## 2017-02-18 NOTE — Progress Notes (Signed)
Subjective: Alvester presents the office today for follow-up evaluation of heel pain to his right heel.  He states that overall he is doing better he has been stretching, icing as well as when the plantar fascial brace.  Overall he still had some discomfort in the gait he feels consistent with overuse type of pain.  Denies any numbness or tingling.  Denies any swelling or redness.  He has no other concerns today in no acute changes otherwise. Denies any systemic complaints such as fevers, chills, nausea, vomiting. No acute changes since last appointment, and no other complaints at this time.   Objective: AAO x3, NAD DP/PT pulses palpable bilaterally, CRT less than 3 seconds There is mild enderness to palpation along the plantar medial tubercle of the calcaneus at the insertion of plantar fascia on the right foot. There is no pain along the course of the plantar fascia within the arch of the foot. Plantar fascia appears to be intact. There is no pain with lateral compression of the calcaneus or pain with vibratory sensation. There is no pain along the course or insertion of the achilles tendon. No other areas of tenderness to bilateral lower extremities.  Decreased subtalar joint range of motion present.  Recent medial arch I. No open lesions or pre-ulcerative lesions.  No pain with calf compression, swelling, warmth, erythema  Assessment: Right plantar fasciitis, subtalar joint osteoarthritis  Plan: -All treatment options discussed with the patient including all alternatives, risks, complications.  -We discussed treatment options at this point.  He wishes to hold off on a steroid injection.  He would like to go and proceed with custom inserts.  He has multiple orthotics today he is aware of the cost of this.  I want him to continue with stretching, icing as well on a daily basis as both the plantar fascial brace.  Also discussed a change in shoes. -Follow-up in 3 weeks to pick up orthotics or sooner if  any issues are to arise. -Patient encouraged to call the office with any questions, concerns, change in symptoms.    Trula Slade DPM

## 2017-03-02 DIAGNOSIS — D1801 Hemangioma of skin and subcutaneous tissue: Secondary | ICD-10-CM | POA: Diagnosis not present

## 2017-03-02 DIAGNOSIS — D229 Melanocytic nevi, unspecified: Secondary | ICD-10-CM | POA: Diagnosis not present

## 2017-03-02 DIAGNOSIS — L821 Other seborrheic keratosis: Secondary | ICD-10-CM | POA: Diagnosis not present

## 2017-03-02 DIAGNOSIS — L820 Inflamed seborrheic keratosis: Secondary | ICD-10-CM | POA: Diagnosis not present

## 2017-03-02 DIAGNOSIS — L570 Actinic keratosis: Secondary | ICD-10-CM | POA: Diagnosis not present

## 2017-03-02 DIAGNOSIS — L814 Other melanin hyperpigmentation: Secondary | ICD-10-CM | POA: Diagnosis not present

## 2017-03-10 ENCOUNTER — Encounter: Payer: Medicare Other | Admitting: Orthotics

## 2017-03-11 DIAGNOSIS — I671 Cerebral aneurysm, nonruptured: Secondary | ICD-10-CM | POA: Diagnosis not present

## 2017-03-11 DIAGNOSIS — Z79899 Other long term (current) drug therapy: Secondary | ICD-10-CM | POA: Diagnosis not present

## 2017-03-11 DIAGNOSIS — Z0001 Encounter for general adult medical examination with abnormal findings: Secondary | ICD-10-CM | POA: Diagnosis not present

## 2017-03-11 DIAGNOSIS — R7303 Prediabetes: Secondary | ICD-10-CM | POA: Diagnosis not present

## 2017-03-11 DIAGNOSIS — M109 Gout, unspecified: Secondary | ICD-10-CM | POA: Diagnosis not present

## 2017-03-11 DIAGNOSIS — I10 Essential (primary) hypertension: Secondary | ICD-10-CM | POA: Diagnosis not present

## 2017-03-11 DIAGNOSIS — I480 Paroxysmal atrial fibrillation: Secondary | ICD-10-CM | POA: Diagnosis not present

## 2017-03-11 DIAGNOSIS — E7800 Pure hypercholesterolemia, unspecified: Secondary | ICD-10-CM | POA: Diagnosis not present

## 2017-03-22 ENCOUNTER — Ambulatory Visit: Payer: Medicare Other | Admitting: Orthotics

## 2017-03-22 DIAGNOSIS — M722 Plantar fascial fibromatosis: Secondary | ICD-10-CM

## 2017-03-22 NOTE — Progress Notes (Signed)
Patient came in today to p/up functional foot orthotics.   The orthotics were assessed to both fit and function.  The F/O addressed the biomechanical issues/pathologies as intended, offering good longitudinal arch support, proper offloading, and foot support. There weren't any signs of discomfort or irritation.  The F/O fit properly in footwear with minimal trimming/adjustments. 

## 2017-04-12 DIAGNOSIS — I484 Atypical atrial flutter: Secondary | ICD-10-CM | POA: Diagnosis not present

## 2017-04-12 DIAGNOSIS — I10 Essential (primary) hypertension: Secondary | ICD-10-CM | POA: Diagnosis not present

## 2017-04-12 DIAGNOSIS — I480 Paroxysmal atrial fibrillation: Secondary | ICD-10-CM | POA: Diagnosis not present

## 2017-04-12 DIAGNOSIS — Z7901 Long term (current) use of anticoagulants: Secondary | ICD-10-CM | POA: Diagnosis not present

## 2017-04-19 ENCOUNTER — Other Ambulatory Visit: Payer: Self-pay | Admitting: Gastroenterology

## 2017-04-19 DIAGNOSIS — R945 Abnormal results of liver function studies: Secondary | ICD-10-CM | POA: Diagnosis not present

## 2017-04-19 DIAGNOSIS — I480 Paroxysmal atrial fibrillation: Secondary | ICD-10-CM | POA: Diagnosis not present

## 2017-04-19 DIAGNOSIS — R7989 Other specified abnormal findings of blood chemistry: Secondary | ICD-10-CM

## 2017-04-21 ENCOUNTER — Ambulatory Visit
Admission: RE | Admit: 2017-04-21 | Discharge: 2017-04-21 | Disposition: A | Discharge status: Home / Self Care | Payer: Medicare Other | Source: Ambulatory Visit | Attending: Gastroenterology | Admitting: Gastroenterology

## 2017-04-21 DIAGNOSIS — R945 Abnormal results of liver function studies: Secondary | ICD-10-CM

## 2017-04-21 DIAGNOSIS — R7989 Other specified abnormal findings of blood chemistry: Secondary | ICD-10-CM

## 2017-06-14 ENCOUNTER — Encounter: Payer: Self-pay | Admitting: Interventional Cardiology

## 2017-06-14 DIAGNOSIS — R7303 Prediabetes: Secondary | ICD-10-CM | POA: Diagnosis not present

## 2017-07-04 DIAGNOSIS — H01005 Unspecified blepharitis left lower eyelid: Secondary | ICD-10-CM | POA: Diagnosis not present

## 2017-07-04 DIAGNOSIS — H353131 Nonexudative age-related macular degeneration, bilateral, early dry stage: Secondary | ICD-10-CM | POA: Diagnosis not present

## 2017-07-04 DIAGNOSIS — H01002 Unspecified blepharitis right lower eyelid: Secondary | ICD-10-CM | POA: Diagnosis not present

## 2017-07-04 DIAGNOSIS — H524 Presbyopia: Secondary | ICD-10-CM | POA: Diagnosis not present

## 2017-07-28 DIAGNOSIS — B078 Other viral warts: Secondary | ICD-10-CM | POA: Diagnosis not present

## 2017-09-26 DIAGNOSIS — B353 Tinea pedis: Secondary | ICD-10-CM | POA: Diagnosis not present

## 2017-09-26 DIAGNOSIS — B078 Other viral warts: Secondary | ICD-10-CM | POA: Diagnosis not present

## 2017-10-11 DIAGNOSIS — E785 Hyperlipidemia, unspecified: Secondary | ICD-10-CM | POA: Diagnosis not present

## 2017-10-11 DIAGNOSIS — E7849 Other hyperlipidemia: Secondary | ICD-10-CM | POA: Diagnosis not present

## 2017-10-11 DIAGNOSIS — Z7901 Long term (current) use of anticoagulants: Secondary | ICD-10-CM | POA: Diagnosis not present

## 2017-10-11 DIAGNOSIS — I484 Atypical atrial flutter: Secondary | ICD-10-CM | POA: Diagnosis not present

## 2017-10-11 DIAGNOSIS — I671 Cerebral aneurysm, nonruptured: Secondary | ICD-10-CM | POA: Diagnosis not present

## 2017-10-11 DIAGNOSIS — I119 Hypertensive heart disease without heart failure: Secondary | ICD-10-CM | POA: Diagnosis not present

## 2017-10-11 DIAGNOSIS — I480 Paroxysmal atrial fibrillation: Secondary | ICD-10-CM | POA: Diagnosis not present

## 2017-10-20 DIAGNOSIS — B078 Other viral warts: Secondary | ICD-10-CM | POA: Diagnosis not present

## 2017-12-15 DIAGNOSIS — B078 Other viral warts: Secondary | ICD-10-CM | POA: Diagnosis not present

## 2018-01-03 DIAGNOSIS — I671 Cerebral aneurysm, nonruptured: Secondary | ICD-10-CM | POA: Diagnosis not present

## 2018-01-05 DIAGNOSIS — I671 Cerebral aneurysm, nonruptured: Secondary | ICD-10-CM | POA: Diagnosis not present

## 2018-01-05 DIAGNOSIS — Z6829 Body mass index (BMI) 29.0-29.9, adult: Secondary | ICD-10-CM | POA: Diagnosis not present

## 2018-01-05 DIAGNOSIS — I10 Essential (primary) hypertension: Secondary | ICD-10-CM | POA: Diagnosis not present

## 2018-01-10 DIAGNOSIS — Z23 Encounter for immunization: Secondary | ICD-10-CM | POA: Diagnosis not present

## 2018-03-08 ENCOUNTER — Telehealth: Payer: Self-pay | Admitting: Cardiology

## 2018-03-08 DIAGNOSIS — Z85828 Personal history of other malignant neoplasm of skin: Secondary | ICD-10-CM | POA: Diagnosis not present

## 2018-03-08 DIAGNOSIS — L814 Other melanin hyperpigmentation: Secondary | ICD-10-CM | POA: Diagnosis not present

## 2018-03-08 DIAGNOSIS — B078 Other viral warts: Secondary | ICD-10-CM | POA: Diagnosis not present

## 2018-03-08 DIAGNOSIS — L821 Other seborrheic keratosis: Secondary | ICD-10-CM | POA: Diagnosis not present

## 2018-03-08 DIAGNOSIS — D229 Melanocytic nevi, unspecified: Secondary | ICD-10-CM | POA: Diagnosis not present

## 2018-03-08 DIAGNOSIS — L819 Disorder of pigmentation, unspecified: Secondary | ICD-10-CM | POA: Diagnosis not present

## 2018-03-08 NOTE — Telephone Encounter (Signed)
°  Two medications, two pharmacies   1. Which medications need to be refilled? (please list name of each medication and dose if known) Eloquis 5mg  1 tablet twice daily  2. Which pharmacy/location (including street and city if local pharmacy) is medication to be sent to?[facility]  3. Do they need a 30 day or 90 day supply? 90   Second script to another pharmacy   1. Which medications need to be refilled? (please list name of each medication and dose if known) Metoprolol succ ER 50mg  once daily  2. Which pharmacy/location (including street and city if local pharmacy) [facility] [address] [place]  3. Do they need a 30 day or 90 day supply? [place]

## 2018-03-08 NOTE — Telephone Encounter (Signed)
Awaiting Dr. Julien Nordmann review, will be reviewed tomorrow.

## 2018-03-17 MED ORDER — APIXABAN 5 MG PO TABS: 5.0000 mg | ORAL_TABLET | Freq: Two times a day (BID) | ORAL | 0 refills | Status: DC

## 2018-03-17 MED ORDER — METOPROLOL SUCCINATE ER 50 MG PO TB24: 50.0000 mg | ORAL_TABLET | Freq: Every day | ORAL | 0 refills | Status: DC

## 2018-03-17 NOTE — Telephone Encounter (Signed)
Refills sent as requested per Dr. Bettina Gavia. Patient is scheduled for an appointment with Dr. Tamala Julian on 04/13/2018 at 11:00 at the [facility].

## 2018-03-17 NOTE — Addendum Note (Signed)
Addended by: Austin Miles on: 03/17/2018 03:55 PM   Modules accepted: Orders

## 2018-04-11 DIAGNOSIS — M7541 Impingement syndrome of right shoulder: Secondary | ICD-10-CM | POA: Diagnosis not present

## 2018-04-11 DIAGNOSIS — M25511 Pain in right shoulder: Secondary | ICD-10-CM | POA: Diagnosis not present

## 2018-04-12 ENCOUNTER — Other Ambulatory Visit: Payer: Self-pay

## 2018-04-13 ENCOUNTER — Ambulatory Visit (INDEPENDENT_AMBULATORY_CARE_PROVIDER_SITE_OTHER): Payer: Medicare Other | Admitting: Interventional Cardiology

## 2018-04-13 ENCOUNTER — Encounter: Payer: Self-pay | Admitting: Interventional Cardiology

## 2018-04-13 VITALS — BP 132/92 | HR 105 | Ht 70.0 in | Wt 204.4 lb

## 2018-04-13 DIAGNOSIS — Z125 Encounter for screening for malignant neoplasm of prostate: Secondary | ICD-10-CM | POA: Diagnosis not present

## 2018-04-13 DIAGNOSIS — Z79899 Other long term (current) drug therapy: Secondary | ICD-10-CM | POA: Diagnosis not present

## 2018-04-13 DIAGNOSIS — Z7901 Long term (current) use of anticoagulants: Secondary | ICD-10-CM | POA: Diagnosis not present

## 2018-04-13 DIAGNOSIS — M109 Gout, unspecified: Secondary | ICD-10-CM | POA: Diagnosis not present

## 2018-04-13 DIAGNOSIS — Z8679 Personal history of other diseases of the circulatory system: Secondary | ICD-10-CM | POA: Diagnosis not present

## 2018-04-13 DIAGNOSIS — Z9889 Other specified postprocedural states: Secondary | ICD-10-CM | POA: Diagnosis not present

## 2018-04-13 DIAGNOSIS — I484 Atypical atrial flutter: Secondary | ICD-10-CM

## 2018-04-13 DIAGNOSIS — I10 Essential (primary) hypertension: Secondary | ICD-10-CM | POA: Diagnosis not present

## 2018-04-13 DIAGNOSIS — E7800 Pure hypercholesterolemia, unspecified: Secondary | ICD-10-CM | POA: Diagnosis not present

## 2018-04-13 DIAGNOSIS — R7303 Prediabetes: Secondary | ICD-10-CM | POA: Diagnosis not present

## 2018-04-13 NOTE — Progress Notes (Signed)
Cardiology Office Note:    Date:  04/13/2018   ID:  Randy Evans, DOB [birth year], MRN [id number]  PCP:  Lujean Amel, MD  Cardiologist:  No primary care provider on file.   Referring MD: Lujean Amel, MD   Chief Complaint  Patient presents with  . Coronary Artery Disease    History of Present Illness:    Randy Evans is a 78 y.o. male with a hx of paroxysmal atrial fibrillation, paroxysmal atypical atrial flutter, ablation x3, most recently 2013 in [place] at [facility] by Dr.Banhson.  Other history includes hemochromatosis, hypertension, and chronic anticoagulation therapy.  Primary cardiologist Dr. Tollie Eth with patient now transitioning to see [facility] for longitudinal management.  Randy Evans is a very nice gentleman who says he feels that his heart is out of rhythm.  States that he and Dr. Wynonia Lawman had been planning a strategy to deal with the rhythm disturbance with 2 possibilities.  The first was to admit him to the hospital and make medication adjustments.  If the medication adjustment did not lead to conversion he would then have electrical cardioversion.  The other would be to refer back to Dr. Nelda Bucks to consider additional catheter based therapy.  States that he has been out of rhythm now for approximately 6 months.  Infrequently he feels that his heart may be back in rhythm but for the most part the heart rate is been continuously between 101 110 bpm over this timeframe.  He has been able to participate in his typical activities including exercise without difficulty.  He has not had syncope, orthopnea, chest pain, orthopnea, or PND.  No blood in his urine or stool on chronic long-term anticoagulation therapy.  No prior history of coronary disease with angiography performed greater than 15 years ago to reveal widely patent coronary arteries.  No recent LV assessment.    Past Medical History:  Diagnosis Date  . Atrial fibrillation ([place])     Onset 1991 in [place]  . Cerebral aneurysm without rupture    treated with coil embolization 2006  . Gout   . Hemochromatosis    Has seen Teena Irani  . Hypertension     Past Surgical History:  Procedure Laterality Date  . ANEURYSM COILING  2006  . bone spur removal  1951   below left knee  . BRAIN SURGERY    . CARDIAC ELECTROPHYSIOLOGY MAPPING AND ABLATION  01/2008; 07/2008  . CARDIOVERSION  2000; 2008 03/2008; ;02/15/2011   2008; 2009 2012:    Surgeon: Kerry Hough., MD;  Location: [place];  Service: Cardiovascular;  Laterality: N/A;  . CARDIOVERSION  08/05/2011   Procedure: CARDIOVERSION;  Surgeon: Jacolyn Reedy, MD;  Location: [place];  Service: Cardiovascular;  Laterality: N/A;  . CATARACT EXTRACTION W/ INTRAOCULAR LENS  IMPLANT, BILATERAL  2009-2010  . IR ANGIO INTRA EXTRACRAN SEL COM CAROTID INNOMINATE UNI L MOD SED  11/09/2016  . IR ANGIO INTRA EXTRACRAN SEL INTERNAL CAROTID UNI R MOD SED  11/09/2016  . LIVER BIOPSY  late 1990's  . TOE SURGERY      Current Medications: Current Meds  Medication Sig  . acetaminophen (TYLENOL) 500 MG tablet Take 1,000 mg by mouth every 6 (six) hours as needed. pain   . allopurinol (ZYLOPRIM) 300 MG tablet Take 300 mg by mouth daily.    Marland Kitchen amLODipine (NORVASC) 5 MG tablet Take 5 mg by mouth daily.    Marland Kitchen apixaban (ELIQUIS) 5 MG  TABS tablet Take 1 tablet (5 mg total) by mouth 2 (two) times daily.  Marland Kitchen atorvastatin (LIPITOR) 10 MG tablet Take 10 mg by mouth daily.  . chlorpheniramine (CHLOR-TRIMETON) 4 MG tablet Take 4 mg by mouth 2 (two) times daily as needed. allergies   . Coenzyme Q10 100 MG capsule Take 100 mg by mouth daily.  Marland Kitchen dofetilide (TIKOSYN) 250 MCG capsule Take 250 mcg by mouth 2 (two) times daily.  Marland Kitchen losartan (COZAAR) 100 MG tablet Take 100 mg by mouth daily.   . metoprolol succinate (TOPROL-XL) 50 MG 24 hr tablet Take 1 tablet (50 mg total) by mouth daily. Take with or immediately following a meal.  . multivitamin-lutein  (OCUVITE-LUTEIN) CAPS Take 1 capsule by mouth daily.       Allergies:   Codeine and Sulfa antibiotics   Social History   Socioeconomic History  . Marital status: Married    Spouse name: Not on file  . Number of children: Not on file  . Years of education: Not on file  . Highest education level: Not on file  Occupational History  . Not on file  Social Needs  . Financial resource strain: Not on file  . Food insecurity:    Worry: Not on file    Inability: Not on file  . Transportation needs:    Medical: Not on file    Non-medical: Not on file  Tobacco Use  . Smoking status: Former Smoker    Years: 4.00    Types: Cigarettes, Pipe, Cigars  . Smokeless tobacco: Never Used  . Tobacco comment: quit smoking 1966  Substance and Sexual Activity  . Alcohol use: Yes    Alcohol/week: 2.0 standard drinks    Types: 2 Cans of beer per week  . Drug use: No  . Sexual activity: Yes  Lifestyle  . Physical activity:    Days per week: Not on file    Minutes per session: Not on file  . Stress: Not on file  Relationships  . Social connections:    Talks on phone: Not on file    Gets together: Not on file    Attends religious service: Not on file    Active member of club or organization: Not on file    Attends meetings of clubs or organizations: Not on file    Relationship status: Not on file  Other Topics Concern  . Not on file  Social History Narrative   Retired from [employer].  Moved from [place]. Graduate of [employer].  Married.     Family History: The patient's family history is not on file.  ROS:   Please see the history of present illness.    No specific complaints currently.  He is retired.  He retired from the [employer].  All other systems reviewed and are negative.  EKGs/Labs/Other Studies Reviewed:    The following studies were reviewed today: Recent laboratory data from Dr. Diana Eves and Dr. Wynonia Lawman reveal an LDL cholesterol of 41 1 year ago.  Hemoglobin A1c of 6.6.  EKG:  EKG is  atrial flutter at a rate of approximately 210 bpm with 2-1 AV conduction and ventricular response of 105 bpm, right bundle branch block, left axis deviation, and nonspecific ST-T wave abnormality.  Recent Labs: No results found for requested labs within last 8760 hours.  Recent Lipid Panel No results found for: CHOL, TRIG, HDL, CHOLHDL, VLDL, LDLCALC, LDLDIRECT  Physical Exam:    VS:  BP (!) 132/92   Pulse Marland Kitchen)  105   Ht 5\' 10"  (1.778 m)   Wt 204 lb 6.4 oz (92.7 kg)   SpO2 98%   BMI 29.33 kg/m     Wt Readings from Last 3 Encounters:  04/13/18 204 lb 6.4 oz (92.7 kg)  11/09/16 197 lb (89.4 kg)  08/05/11 190 lb (86.2 kg)     GEN: In no distress, skin is pink,.  Head appears younger than his stated age. HEENT: Normal NECK: No JVD. LYMPHATICS: No lymphadenopathy CARDIAC: Rapid RRR.  No murmur, gallop, edema VASCULAR: Pulses 2+ bilateral radial and carotid, Bruits absent in the carotids. RESPIRATORY:  Clear to auscultation without rales, wheezing or rhonchi  ABDOMEN: Soft, non-tender, non-distended, No pulsatile mass, MUSCULOSKELETAL: No deformity  SKIN: Warm and dry NEUROLOGIC:  Alert and oriented x 3 PSYCHIATRIC:  Normal affect   ASSESSMENT:    1. Atypical atrial flutter ([place])   2. Essential hypertension   3. Long term current use of anticoagulant therapy   4. Hemochromatosis, unspecified hemochromatosis type   5. S/P ablation of atrial flutter   6. S/P ablation of atrial fibrillation    PLAN:    In order of problems listed above:  1. He is in atrial flutter, atypical with 2-1 conduction.  Asymptomatic.  After some discussion we have decided rather than take any action on his current state here in [place], will refer him back to Dr. Ailene Ards to get direction concerning therapy of atrial flutter.  He is asymptomatic at this time.  We will do a 2D Doppler echocardiogram to assess LV function.  Continue anticoagulation therapy.  Need to consider rhythm versus rate  control.  Atrial flutter will not be a good rhythm to attempt rate control. 2. Target blood pressure 130/80 mmHg. 3. Continue chronic anticoagulation therapy on Eliquis. 4. Not addressed 5. Determine if further ablation is possible.  Clinical follow-up with me in 6 months.  Earlier if problems.   Medication Adjustments/Labs and Tests Ordered: Current medicines are reviewed at length with the patient today.  Concerns regarding medicines are outlined above.  Orders Placed This Encounter  Procedures  . Ambulatory referral to Cardiac Electrophysiology  . EKG 12-Lead  . ECHOCARDIOGRAM COMPLETE   No orders of the defined types were placed in this encounter.   Patient Instructions  Medication Instructions:  Your physician recommends that you continue on your current medications as directed. Please refer to the Current Medication list given to you today.  If you need a refill on your cardiac medications before your next appointment, please call your pharmacy.   Lab work: None If you have labs (blood work) drawn today and your tests are completely normal, you will receive your results only by: Marland Kitchen MyChart Message (if you have MyChart) OR . A paper copy in the mail If you have any lab test that is abnormal or we need to change your treatment, we will call you to review the results.  Testing/Procedures: Your physician has requested that you have an echocardiogram. Echocardiography is a painless test that uses sound waves to create images of your heart. It provides your doctor with information about the size and shape of your heart and how well your heart's chambers and valves are working. This procedure takes approximately one hour. There are no restrictions for this procedure.    Follow-Up: At [facility], you and your health needs are our priority.  As part of our continuing mission to provide you with exceptional heart care, we have created designated Provider  Care Teams.  These Care  Teams include your primary Cardiologist (physician) and Advanced Practice Providers (APPs -  Physician Assistants and Nurse Practitioners) who all work together to provide you with the care you need, when you need it. You will need a follow up appointment in 6 months.  Please call our office 2 months in advance to schedule this appointment.  You may see Dr. Tamala Julian or one of the following Advanced Practice Providers on your designated Care Team:   Truitt Merle, NP Cecilie Kicks, NP . Kathyrn Drown, NP  Any Other Special Instructions Will Be Listed Below (If Applicable).  You have been referred to Dr. Omelia Blackwater with [facility].      Signed, Sinclair Grooms, MD  04/13/2018 1:07 PM    [place]

## 2018-04-13 NOTE — Patient Instructions (Signed)
Medication Instructions:  Your physician recommends that you continue on your current medications as directed. Please refer to the Current Medication list given to you today.  If you need a refill on your cardiac medications before your next appointment, please call your pharmacy.   Lab work: None If you have labs (blood work) drawn today and your tests are completely normal, you will receive your results only by: Marland Kitchen MyChart Message (if you have MyChart) OR . A paper copy in the mail If you have any lab test that is abnormal or we need to change your treatment, we will call you to review the results.  Testing/Procedures: Your physician has requested that you have an echocardiogram. Echocardiography is a painless test that uses sound waves to create images of your heart. It provides your doctor with information about the size and shape of your heart and how well your heart's chambers and valves are working. This procedure takes approximately one hour. There are no restrictions for this procedure.    Follow-Up: At [facility], you and your health needs are our priority.  As part of our continuing mission to provide you with exceptional heart care, we have created designated Provider Care Teams.  These Care Teams include your primary Cardiologist (physician) and Advanced Practice Providers (APPs -  Physician Assistants and Nurse Practitioners) who all work together to provide you with the care you need, when you need it. You will need a follow up appointment in 6 months.  Please call our office 2 months in advance to schedule this appointment.  You may see Dr. Tamala Julian or one of the following Advanced Practice Providers on your designated Care Team:   Truitt Merle, NP Cecilie Kicks, NP . Kathyrn Drown, NP  Any Other Special Instructions Will Be Listed Below (If Applicable).  You have been referred to Dr. Omelia Blackwater with [facility].

## 2018-04-14 DIAGNOSIS — L570 Actinic keratosis: Secondary | ICD-10-CM | POA: Diagnosis not present

## 2018-04-14 DIAGNOSIS — B078 Other viral warts: Secondary | ICD-10-CM | POA: Diagnosis not present

## 2018-04-18 DIAGNOSIS — E7800 Pure hypercholesterolemia, unspecified: Secondary | ICD-10-CM | POA: Diagnosis not present

## 2018-04-18 DIAGNOSIS — I671 Cerebral aneurysm, nonruptured: Secondary | ICD-10-CM | POA: Diagnosis not present

## 2018-04-18 DIAGNOSIS — R7303 Prediabetes: Secondary | ICD-10-CM | POA: Diagnosis not present

## 2018-04-18 DIAGNOSIS — I10 Essential (primary) hypertension: Secondary | ICD-10-CM | POA: Diagnosis not present

## 2018-04-18 DIAGNOSIS — Z23 Encounter for immunization: Secondary | ICD-10-CM | POA: Diagnosis not present

## 2018-04-18 DIAGNOSIS — Z0001 Encounter for general adult medical examination with abnormal findings: Secondary | ICD-10-CM | POA: Diagnosis not present

## 2018-04-18 DIAGNOSIS — I4892 Unspecified atrial flutter: Secondary | ICD-10-CM | POA: Diagnosis not present

## 2018-04-18 DIAGNOSIS — M109 Gout, unspecified: Secondary | ICD-10-CM | POA: Diagnosis not present

## 2018-04-25 ENCOUNTER — Ambulatory Visit (HOSPITAL_COMMUNITY): Payer: Medicare Other | Attending: Cardiovascular Disease

## 2018-04-25 ENCOUNTER — Other Ambulatory Visit: Payer: Self-pay

## 2018-04-25 DIAGNOSIS — I484 Atypical atrial flutter: Secondary | ICD-10-CM

## 2018-04-26 ENCOUNTER — Other Ambulatory Visit: Payer: Self-pay | Admitting: Interventional Cardiology

## 2018-04-26 MED ORDER — METOPROLOL SUCCINATE ER 50 MG PO TB24: 50.0000 mg | ORAL_TABLET | Freq: Every day | ORAL | 3 refills | Status: DC

## 2018-04-26 NOTE — Telephone Encounter (Signed)
 *  STAT* If patient is at the pharmacy, call can be transferred to refill team.   1. Which medications need to be refilled? (please list name of each medication and dose if known)  metoprolol succinate (TOPROL-XL) 50 MG 24 hr tablet  2. Which pharmacy/location (including street and city if local pharmacy) is medication to be sent to?  [facility] [address]  3. Do they need a 30 day or 90 day supply? [place]

## 2018-04-26 NOTE — Telephone Encounter (Signed)
Pt's medication was sent to pt's pharmacy as requested. Confirmation received.  °

## 2018-05-15 ENCOUNTER — Telehealth: Payer: Self-pay | Admitting: Interventional Cardiology

## 2018-05-15 DIAGNOSIS — Z79899 Other long term (current) drug therapy: Secondary | ICD-10-CM | POA: Diagnosis not present

## 2018-05-15 DIAGNOSIS — I671 Cerebral aneurysm, nonruptured: Secondary | ICD-10-CM | POA: Diagnosis not present

## 2018-05-15 DIAGNOSIS — Z87891 Personal history of nicotine dependence: Secondary | ICD-10-CM | POA: Diagnosis not present

## 2018-05-15 DIAGNOSIS — I484 Atypical atrial flutter: Secondary | ICD-10-CM | POA: Diagnosis not present

## 2018-05-15 DIAGNOSIS — I10 Essential (primary) hypertension: Secondary | ICD-10-CM | POA: Diagnosis not present

## 2018-05-15 DIAGNOSIS — I4581 Long QT syndrome: Secondary | ICD-10-CM | POA: Diagnosis not present

## 2018-05-15 DIAGNOSIS — R002 Palpitations: Secondary | ICD-10-CM | POA: Diagnosis not present

## 2018-05-15 DIAGNOSIS — I452 Bifascicular block: Secondary | ICD-10-CM | POA: Diagnosis not present

## 2018-05-15 DIAGNOSIS — I480 Paroxysmal atrial fibrillation: Secondary | ICD-10-CM | POA: Diagnosis not present

## 2018-05-15 DIAGNOSIS — Z7901 Long term (current) use of anticoagulants: Secondary | ICD-10-CM | POA: Diagnosis not present

## 2018-05-15 NOTE — Telephone Encounter (Signed)
I will route to Maude Leriche, LPN for Dr.  Tamala Julian as not urgent for Triage

## 2018-05-15 NOTE — Telephone Encounter (Signed)
New Message   PT is calling because he consulted with Dr Marjie Skiff at [facility] today and would like to share the information with Dr Tamala Julian He also says he had an echo on 01/21 and you would like the results to be shared with Dr Marjie Skiff

## 2018-05-15 NOTE — Telephone Encounter (Signed)
Pt seen by Dr. Omelia Blackwater at [facility] today.  The plan is to move forward with another ablation.  Pt has an upcoming nuclear stress test at [facility] on the 27th.  Pt states Dr. Omelia Blackwater felt with the changes that have occurred since his last ablation, this one may be more successful.  Pt wanted to update Dr. Tamala Julian.  Advised I will send message to him to let him know what is going on.  I have also sent a copy of pt's echo to Dr. Omelia Blackwater per pt's request.

## 2018-05-16 DIAGNOSIS — R208 Other disturbances of skin sensation: Secondary | ICD-10-CM | POA: Diagnosis not present

## 2018-05-16 DIAGNOSIS — B078 Other viral warts: Secondary | ICD-10-CM | POA: Diagnosis not present

## 2018-05-20 NOTE — Telephone Encounter (Signed)
Okay 

## 2018-05-26 DIAGNOSIS — I484 Atypical atrial flutter: Secondary | ICD-10-CM | POA: Diagnosis not present

## 2018-06-01 DIAGNOSIS — Z9889 Other specified postprocedural states: Secondary | ICD-10-CM | POA: Diagnosis not present

## 2018-06-01 DIAGNOSIS — E785 Hyperlipidemia, unspecified: Secondary | ICD-10-CM | POA: Diagnosis not present

## 2018-06-01 DIAGNOSIS — I484 Atypical atrial flutter: Secondary | ICD-10-CM | POA: Diagnosis not present

## 2018-06-01 DIAGNOSIS — G4733 Obstructive sleep apnea (adult) (pediatric): Secondary | ICD-10-CM | POA: Diagnosis not present

## 2018-06-01 DIAGNOSIS — I10 Essential (primary) hypertension: Secondary | ICD-10-CM | POA: Diagnosis not present

## 2018-06-01 DIAGNOSIS — I4891 Unspecified atrial fibrillation: Secondary | ICD-10-CM | POA: Diagnosis not present

## 2018-06-07 DIAGNOSIS — Z8679 Personal history of other diseases of the circulatory system: Secondary | ICD-10-CM | POA: Diagnosis not present

## 2018-06-07 DIAGNOSIS — Z7901 Long term (current) use of anticoagulants: Secondary | ICD-10-CM | POA: Diagnosis not present

## 2018-06-07 DIAGNOSIS — Z79899 Other long term (current) drug therapy: Secondary | ICD-10-CM | POA: Diagnosis not present

## 2018-06-07 DIAGNOSIS — R002 Palpitations: Secondary | ICD-10-CM | POA: Diagnosis not present

## 2018-06-07 DIAGNOSIS — Z9889 Other specified postprocedural states: Secondary | ICD-10-CM | POA: Diagnosis not present

## 2018-06-07 DIAGNOSIS — I471 Supraventricular tachycardia: Secondary | ICD-10-CM | POA: Diagnosis not present

## 2018-06-07 DIAGNOSIS — Z01810 Encounter for preprocedural cardiovascular examination: Secondary | ICD-10-CM | POA: Diagnosis not present

## 2018-06-07 DIAGNOSIS — I4891 Unspecified atrial fibrillation: Secondary | ICD-10-CM | POA: Diagnosis not present

## 2018-06-07 DIAGNOSIS — I10 Essential (primary) hypertension: Secondary | ICD-10-CM | POA: Diagnosis not present

## 2018-06-07 DIAGNOSIS — I671 Cerebral aneurysm, nonruptured: Secondary | ICD-10-CM | POA: Diagnosis not present

## 2018-06-08 DIAGNOSIS — I440 Atrioventricular block, first degree: Secondary | ICD-10-CM | POA: Diagnosis not present

## 2018-06-08 DIAGNOSIS — I484 Atypical atrial flutter: Secondary | ICD-10-CM | POA: Diagnosis not present

## 2018-06-08 DIAGNOSIS — R9431 Abnormal electrocardiogram [ECG] [EKG]: Secondary | ICD-10-CM | POA: Diagnosis not present

## 2018-06-08 DIAGNOSIS — I444 Left anterior fascicular block: Secondary | ICD-10-CM | POA: Diagnosis not present

## 2018-06-08 DIAGNOSIS — I471 Supraventricular tachycardia: Secondary | ICD-10-CM | POA: Diagnosis not present

## 2018-06-08 DIAGNOSIS — I4891 Unspecified atrial fibrillation: Secondary | ICD-10-CM | POA: Diagnosis not present

## 2018-06-08 DIAGNOSIS — I4510 Unspecified right bundle-branch block: Secondary | ICD-10-CM | POA: Diagnosis not present

## 2018-06-08 DIAGNOSIS — Z79899 Other long term (current) drug therapy: Secondary | ICD-10-CM | POA: Diagnosis not present

## 2018-06-08 DIAGNOSIS — I4819 Other persistent atrial fibrillation: Secondary | ICD-10-CM | POA: Diagnosis not present

## 2018-06-08 DIAGNOSIS — Z7901 Long term (current) use of anticoagulants: Secondary | ICD-10-CM | POA: Diagnosis not present

## 2018-06-09 DIAGNOSIS — Z79899 Other long term (current) drug therapy: Secondary | ICD-10-CM | POA: Diagnosis not present

## 2018-06-09 DIAGNOSIS — I4819 Other persistent atrial fibrillation: Secondary | ICD-10-CM | POA: Diagnosis not present

## 2018-06-09 DIAGNOSIS — I444 Left anterior fascicular block: Secondary | ICD-10-CM | POA: Diagnosis not present

## 2018-06-09 DIAGNOSIS — I484 Atypical atrial flutter: Secondary | ICD-10-CM | POA: Diagnosis not present

## 2018-06-09 DIAGNOSIS — I4891 Unspecified atrial fibrillation: Secondary | ICD-10-CM | POA: Diagnosis not present

## 2018-06-09 DIAGNOSIS — I471 Supraventricular tachycardia: Secondary | ICD-10-CM | POA: Diagnosis not present

## 2018-06-09 DIAGNOSIS — R9431 Abnormal electrocardiogram [ECG] [EKG]: Secondary | ICD-10-CM | POA: Diagnosis not present

## 2018-06-15 ENCOUNTER — Other Ambulatory Visit: Payer: Self-pay | Admitting: Interventional Cardiology

## 2018-06-19 ENCOUNTER — Other Ambulatory Visit: Payer: Self-pay

## 2018-06-19 MED ORDER — AMLODIPINE BESYLATE 5 MG PO TABS: 5.0000 mg | ORAL_TABLET | Freq: Every day | ORAL | 3 refills | Status: DC

## 2018-06-20 DIAGNOSIS — Z23 Encounter for immunization: Secondary | ICD-10-CM | POA: Diagnosis not present

## 2018-06-23 ENCOUNTER — Other Ambulatory Visit: Payer: Self-pay | Admitting: *Deleted

## 2018-06-23 ENCOUNTER — Telehealth: Payer: Self-pay | Admitting: Interventional Cardiology

## 2018-06-23 MED ORDER — APIXABAN 5 MG PO TABS: 5.0000 mg | ORAL_TABLET | Freq: Two times a day (BID) | ORAL | 0 refills | Status: DC

## 2018-06-23 NOTE — Telephone Encounter (Signed)
Okay to refill? Previously refilled by Dr Wynonia Lawman. Please advise. Thanks, MI

## 2018-06-23 NOTE — Telephone Encounter (Signed)
Dr. Tamala Julian- ok to refill or should we defer to Dr. Omelia Blackwater?

## 2018-06-23 NOTE — Telephone Encounter (Signed)
°*  STAT* If patient is at the pharmacy, call can be transferred to refill team.   1. Which medications need to be refilled? (please list name of each medication and dose if known) dofetilide (TIKOSYN) 250 MCG capsule  2. Which pharmacy/location (including street and city if local pharmacy) is medication to be sent to? [id number] [place], [place] - [address]  3. Do they need a 30 day or 90 day supply? 30 days

## 2018-06-24 NOTE — Telephone Encounter (Signed)
Okay to refill. Need to be sure Dr. Omelia Blackwater has not made changes in dose. Confirm with ptient.

## 2018-06-26 ENCOUNTER — Other Ambulatory Visit: Payer: Self-pay

## 2018-06-26 NOTE — Telephone Encounter (Signed)
Okay to refill. Verify dose with patient.

## 2018-06-27 MED ORDER — DOFETILIDE 250 MCG PO CAPS: 250.0000 ug | ORAL_CAPSULE | Freq: Two times a day (BID) | ORAL | 4 refills | Status: DC

## 2018-06-27 MED ORDER — LOSARTAN POTASSIUM 100 MG PO TABS: 100.0000 mg | ORAL_TABLET | Freq: Every day | ORAL | 2 refills | Status: DC

## 2018-06-27 NOTE — Telephone Encounter (Signed)
Spoke with pt and clarified that he does take Dofetilide 278mcg BID.  Medication sent to preferred pharmacy.

## 2018-06-27 NOTE — Telephone Encounter (Signed)
Ok to fill.  He was seen in the office in January.

## 2018-06-30 ENCOUNTER — Telehealth: Payer: Self-pay | Admitting: Interventional Cardiology

## 2018-06-30 MED ORDER — DOFETILIDE 250 MCG PO CAPS: 250.0000 ug | ORAL_CAPSULE | Freq: Two times a day (BID) | ORAL | 3 refills | Status: DC

## 2018-06-30 NOTE — Telephone Encounter (Signed)
Spoke with someone at [facility] and she states that do not fill Dofetilide for people with this pts insurance, it has to come from [facility].  Called [facility] and they said they just received 6000 pills so they can not fill the prescription.  Sent prescription to [facility].  Spoke with pt and made him aware.  Pt appreciative for assistance.

## 2018-06-30 NOTE — Telephone Encounter (Signed)
New Message   Pt c/o medication issue:  1. Name of Medication: Dofetilide   2. How are you currently taking this medication (dosage and times per day)? 250 MCG   3. Are you having a reaction (difficulty breathing--STAT)? NO  4. What is your medication issue? Patient states his pharmacy has a lower dosage of the medication in stock which will be 150 MCG's and needs a new script sent over with the corrected dosage.

## 2018-07-05 ENCOUNTER — Telehealth: Payer: Self-pay | Admitting: Interventional Cardiology

## 2018-07-05 NOTE — Telephone Encounter (Addendum)
Attempted to call the pt but No Answer...  [place] at [phone] and they said RX went to Home Delivery Dept and needs to got to Specialty Pharmacy.Marland Kitchen transferred me to [phone].... on hold several minutes....pharmacist Justice Rocher) advised me that he still has 2 refills each 90 days which one will be delivered to him tomorrow.  Spoke with the pt and he also spoke with them and there was an error on the RX end.. they were switching manufacturers to keep in stock and for some reason it was not handled properly. But, will get his RX tomorrow and thanked me for my call.

## 2018-07-05 NOTE — Telephone Encounter (Signed)
° ° °*  STAT* If patient is at the pharmacy, call can be transferred to refill team.   1. Which medications need to be refilled? (please list name of each medication and dose if known) losartan (COZAAR) 100 MG tablet 2. Which pharmacy/location (including street and city if local pharmacy) is medication to be sent to?[id number] [place], [place] [facility] [address]  3. Do they need a 30 day or 90 day supply? [place]

## 2018-07-05 NOTE — Telephone Encounter (Signed)
° ° °  Patient calling with f/u questions from 3/27. [facility] has not processed medication.  Please call

## 2018-07-05 NOTE — Telephone Encounter (Signed)
Follow Up:    Pt said he received word that his prescription will be coming tomorrow. The hold up was they had the medicine but it was not the right manufacturer.

## 2018-07-13 DIAGNOSIS — R634 Abnormal weight loss: Secondary | ICD-10-CM | POA: Diagnosis not present

## 2018-07-13 DIAGNOSIS — R945 Abnormal results of liver function studies: Secondary | ICD-10-CM | POA: Diagnosis not present

## 2018-07-13 DIAGNOSIS — I480 Paroxysmal atrial fibrillation: Secondary | ICD-10-CM | POA: Diagnosis not present

## 2018-08-04 DIAGNOSIS — I10 Essential (primary) hypertension: Secondary | ICD-10-CM | POA: Diagnosis not present

## 2018-08-04 DIAGNOSIS — I517 Cardiomegaly: Secondary | ICD-10-CM | POA: Diagnosis not present

## 2018-08-04 DIAGNOSIS — I4811 Longstanding persistent atrial fibrillation: Secondary | ICD-10-CM | POA: Diagnosis not present

## 2018-08-14 DIAGNOSIS — H353131 Nonexudative age-related macular degeneration, bilateral, early dry stage: Secondary | ICD-10-CM | POA: Diagnosis not present

## 2018-08-14 DIAGNOSIS — Z961 Presence of intraocular lens: Secondary | ICD-10-CM | POA: Diagnosis not present

## 2018-08-14 DIAGNOSIS — H524 Presbyopia: Secondary | ICD-10-CM | POA: Diagnosis not present

## 2018-08-15 DIAGNOSIS — R7303 Prediabetes: Secondary | ICD-10-CM | POA: Diagnosis not present

## 2018-08-15 DIAGNOSIS — R945 Abnormal results of liver function studies: Secondary | ICD-10-CM | POA: Diagnosis not present

## 2018-09-06 ENCOUNTER — Telehealth: Payer: Self-pay | Admitting: Interventional Cardiology

## 2018-09-06 NOTE — Telephone Encounter (Signed)
Not a cardiac med.  Needs to come from PCP.  Thanks.

## 2018-09-06 NOTE — Telephone Encounter (Signed)
Pt calling requesting a refill on allopurinol, that pt stated that Dr. Wynonia Lawman filled for him and pt wanted to know if Dr. Tamala Julian would refill this medication for him as well. Please address

## 2018-09-06 NOTE — Telephone Encounter (Signed)
Called pt and left message informing pt that he needed to contact his PCP for a refill on his medication allopurinol and if he has any other problems, questions or concerns to call the office.

## 2018-09-11 ENCOUNTER — Other Ambulatory Visit: Payer: Self-pay | Admitting: Interventional Cardiology

## 2018-09-13 DIAGNOSIS — I517 Cardiomegaly: Secondary | ICD-10-CM | POA: Diagnosis not present

## 2018-09-13 DIAGNOSIS — I5023 Acute on chronic systolic (congestive) heart failure: Secondary | ICD-10-CM | POA: Diagnosis not present

## 2018-09-13 DIAGNOSIS — I484 Atypical atrial flutter: Secondary | ICD-10-CM | POA: Diagnosis not present

## 2018-09-13 DIAGNOSIS — Z79899 Other long term (current) drug therapy: Secondary | ICD-10-CM | POA: Diagnosis not present

## 2018-09-13 DIAGNOSIS — I4819 Other persistent atrial fibrillation: Secondary | ICD-10-CM | POA: Diagnosis not present

## 2018-09-13 DIAGNOSIS — Z8679 Personal history of other diseases of the circulatory system: Secondary | ICD-10-CM | POA: Diagnosis not present

## 2018-09-13 DIAGNOSIS — Z9889 Other specified postprocedural states: Secondary | ICD-10-CM | POA: Diagnosis not present

## 2018-09-13 DIAGNOSIS — E859 Amyloidosis, unspecified: Secondary | ICD-10-CM | POA: Diagnosis not present

## 2018-09-19 DIAGNOSIS — I517 Cardiomegaly: Secondary | ICD-10-CM | POA: Diagnosis not present

## 2018-09-25 ENCOUNTER — Other Ambulatory Visit: Payer: Self-pay | Admitting: *Deleted

## 2018-09-25 MED ORDER — APIXABAN 5 MG PO TABS: 5.0000 mg | ORAL_TABLET | Freq: Two times a day (BID) | ORAL | 1 refills | Status: DC

## 2018-09-25 NOTE — Telephone Encounter (Signed)
Pt last saw Dr Tamala Julian 04/13/18, last labs 06/09/18 Creat 0.8 at [facility], age 78, weight 92.7kg, based on specified criteria is on appropriate dosage of Eliquis 5mg  BID.  Will refill rx.

## 2018-09-26 ENCOUNTER — Telehealth: Payer: Self-pay | Admitting: Interventional Cardiology

## 2018-09-26 NOTE — Telephone Encounter (Signed)
  Patient states that he was returning your call

## 2018-09-27 ENCOUNTER — Telehealth: Payer: Self-pay | Admitting: Interventional Cardiology

## 2018-09-27 NOTE — Telephone Encounter (Signed)
New Message     COVID SCREENING QUESTIONS FOR IN-OFFICE VISITS - PLEASE DOCUMENT PATIENT ANSWERS  1. Do you currently have a fever?  a. NO  2. Have you recently traveled on a cruise, internationally, or to [place], [place], [place], [place], [place], or [place], [place] [employer])?  a. NO  3. Have you been in contact with someone that is currently pending confirmation of Covid19 testing or has been confirmed to have the [place] virus?  a. NO  4. Are you currently experiencing fatigue or cough?  A. NO

## 2018-09-27 NOTE — Progress Notes (Signed)
Cardiology Office Note:    Date:  09/28/2018   ID:  Randy Evans, DOB February 21, 1941, MRN [id number]  PCP:  Lujean Amel, MD  Cardiologist:  No primary care provider on file.   Referring MD: Lujean Amel, MD   Chief Complaint  Patient presents with   Atrial Fibrillation   Congestive Heart Failure    History of Present Illness:    Randy Evans is a 78 y.o. male with a hx of  paroxysmal atrial fibrillation, paroxysmal atypical atrial flutter, ablation x3, most recently 2013 at [facility] by Dr.Banhson.  Other history includes hemochromatosis, hypertension, and chronic anticoagulation therapy.  Repeat atrial flutter ablation at [facility]  The patient returns for follow-up after ablation at [facility].  He underwent ablation for atypical flutter.  He is still on dofetilide.  He has not upcoming appointment with Dr. Marjie Skiff later this summer or early fall.  In preparation for the atrial flutter ablation, he underwent cardiac MRI which had late enhancement in a pattern that suggested the possibility of amyloid.  Since the patient has hemochromatosis there was also concerned that there could be infiltration with iron.  The readers at [facility] felt there were no definite signs of iron overload.  A subsequent PYP scan was done and was indeterminant with the ratio measuring 1.13.  He was seen by Dr. Lora Paula, who will do further evaluation with a repeat PYP scan in 6 to 9 months.  No specific therapy at this time.  They also recommend doing monoclonal antibody studies to rule out plasma cell dyscrasia.  Overall since the ablation the patient feels better.  Past Medical History:  Diagnosis Date   Atrial fibrillation ([place])    Onset 1991 in [place]   Cerebral aneurysm without rupture    treated with coil embolization 2006   Gout    Hemochromatosis    Has seen Teena Irani   Hypertension     Past Surgical History:  Procedure  Laterality Date   ANEURYSM COILING  2006   bone spur removal  1951   below left knee   BRAIN SURGERY     CARDIAC ELECTROPHYSIOLOGY [place]  01/2008; 07/2008   CARDIOVERSION  2000; 2008 03/2008; ;02/15/2011   2008; 2009 2012:    Surgeon: Kerry Hough., MD;  Location: [place];  Service: Cardiovascular;  Laterality: N/A;   CARDIOVERSION  08/05/2011   Procedure: CARDIOVERSION;  Surgeon: Jacolyn Reedy, MD;  Location: [place];  Service: Cardiovascular;  Laterality: N/A;   CATARACT EXTRACTION W/ INTRAOCULAR LENS  IMPLANT, BILATERAL  2009-2010   IR ANGIO INTRA EXTRACRAN SEL COM CAROTID INNOMINATE UNI L MOD SED  11/09/2016   IR ANGIO INTRA EXTRACRAN SEL INTERNAL CAROTID UNI R MOD SED  11/09/2016   LIVER BIOPSY  late 1990's   TOE SURGERY      Current Medications: Current Meds  Medication Sig   acetaminophen (TYLENOL) 500 MG tablet Take 1,000 mg by mouth every 6 (six) hours as needed. pain    allopurinol (ZYLOPRIM) 300 MG tablet Take 300 mg by mouth daily.     amLODipine (NORVASC) 5 MG tablet TAKE 1 TABLET BY MOUTH EVERY DAY   apixaban (ELIQUIS) 5 MG TABS tablet Take 1 tablet (5 mg total) by mouth 2 (two) times daily.   atorvastatin (LIPITOR) 10 MG tablet Take 10 mg by mouth daily.   chlorpheniramine (CHLOR-TRIMETON) 4 MG tablet Take 4 mg by mouth 2 (two)  times daily as needed. allergies    Coenzyme Q10 100 MG capsule Take 100 mg by mouth daily.   dofetilide (TIKOSYN) 250 MCG capsule Take 1 capsule (250 mcg total) by mouth 2 (two) times daily.   losartan (COZAAR) 100 MG tablet Take 1 tablet (100 mg total) by mouth daily.   metoprolol succinate (TOPROL-XL) 50 MG 24 hr tablet Take 1 tablet (50 mg total) by mouth daily. Take with or immediately following a meal.   multivitamin-lutein (OCUVITE-LUTEIN) CAPS Take 1 capsule by mouth daily.       Allergies:   Codeine and Sulfa antibiotics   Social History   Socioeconomic History   Marital status: Married     Spouse name: Not on file   Number of children: Not on file   Years of education: Not on file   Highest education level: Not on file  Occupational History   Not on file  Social Needs   Financial resource strain: Not on file   Food insecurity    Worry: Not on file    Inability: Not on file   Transportation needs    Medical: Not on file    Non-medical: Not on file  Tobacco Use   Smoking status: Former Smoker    Years: 4.00    Types: Cigarettes, Pipe, Cigars   Smokeless tobacco: Never Used   Tobacco comment: quit smoking 1966  Substance and Sexual Activity   Alcohol use: Yes    Alcohol/week: 2.0 standard drinks    Types: 2 Cans of beer per week   Drug use: No   Sexual activity: Yes  Lifestyle   Physical activity    Days per week: Not on file    Minutes per session: Not on file   Stress: Not on file  Relationships   Social connections    Talks on phone: Not on file    Gets together: Not on file    Attends religious service: Not on file    Active member of club or organization: Not on file    Attends meetings of clubs or organizations: Not on file    Relationship status: Not on file  Other Topics Concern   Not on file  Social History Narrative   Retired from [employer].  Moved from [place]. Graduate of [employer].  Married.     Family History: The patient's family history is not on file.  ROS:   Please see the history of present illness.    No complaints and is seems his major concern is whether he should stay on dofetilide or not.  All other systems reviewed and are negative.  EKGs/Labs/Other Studies Reviewed:    The following studies were reviewed today:  Cardiac MRI June 07, 2018: [facility] Cardiac MRI 1. The left ventricle is normal in cavity size. The severe concentric LVH (although the septum is more hypertrophied than the other walls). The max wall thickness measures 2.1 cm in the basal septum. a. The basal septum is mildly  hypokinetic. b. Basal inferolateral wall is severely hypokinetic. The mid inferolateral wall is mildly hypokinetic. Global systolic function is mildly reduced with an LVEF calculated at 50%.  2. The right ventricle is normal in cavity size, wall thickness, and systolic function.  3. The right atrium is normal is cavity size. The left atrium is severely enlarged.   4. The aortic valve is trileaflet in morphology. There is no significant aortic valve stenosis. There is mild aortic regurgitation.  5. There  is trivial mitral regurgitation. There is mild tricuspid regurgitation. There is trivial pulmonic regurgitation.  6. Delayed enhancement imaging is abnormal.  a. There is a focal area of transmural hyperenhancement in the basal inferolateral wall, extending into the mid inferolateral wall. b. There is patchy enhancement of the basal septum, extending into the basal anterior wall.   The region of hyperenhancement involving the basal-mid lateral wall is most consistent with myocardial infarction in the distribution of an obtuse marginal branch of the LCx.   The patchy enhancement involving the basal septum, extending into the basal anterior wall is non-specific, but could represent early cardiac amyloidosis.Recommend follow up imaging in 6-12 months to look for stability of these findings.  7. The pericardium is normal in thickness. There is significant pericardial effusion.  8. There is no evidence of an intracardiac thrombus.  Nuclear medicine PYP scan 09/14/2018 Conclusions:  1. The combined cardiac scintigraphic findings, as described above, are equivocal for TTR amyloidosis.   2. If echo or cardiac MRI are strongly positive, consider further evaluation including endomyocardial biopsy.   EKG:  EKG last performed April 13, 2018 revealed atrial flutter (atypical) with right bundle and left anterior hemiblock.  No new tracing since that time.  Recent Labs: No results found  for requested labs within last 8760 hours.  Recent Lipid Panel No results found for: CHOL, TRIG, HDL, CHOLHDL, VLDL, LDLCALC, LDLDIRECT  Physical Exam:    VS:  BP 136/82    Pulse 68    Ht 5\' 10"  (1.778 m)    Wt 199 lb 1.9 oz (90.3 kg)    SpO2 97%    BMI 28.57 kg/m     Wt Readings from Last 3 Encounters:  09/28/18 199 lb 1.9 oz (90.3 kg)  04/13/18 204 lb 6.4 oz (92.7 kg)  11/09/16 197 lb (89.4 kg)     GEN: Moderate obesity. No acute distress HEENT: Normal NECK: No JVD. LYMPHATICS: No lymphadenopathy CARDIAC: RRR.  No murmur, 4 gallop, no edema VASCULAR: 2+ bilateral radial pulses, no bruits RESPIRATORY:  Clear to auscultation without rales, wheezing or rhonchi  ABDOMEN: Soft, non-tender, non-distended, No pulsatile mass, MUSCULOSKELETAL: No deformity  SKIN: Warm and dry NEUROLOGIC:  Alert and oriented x 3 PSYCHIATRIC:  Normal affect   ASSESSMENT:    1. Atypical atrial flutter ([place])   2. On dofetilide therapy   3. S/P ablation of atrial fibrillation   4. S/P ablation of atrial flutter   5. Long term current use of anticoagulant therapy   6. Essential hypertension   7. Hemochromatosis, unspecified hemochromatosis type   8. Educated About Covid-19 Virus Infection   9. Cardiac amyloidosis ([place])    PLAN:    In order of problems listed above:  1. Ablation of atypical flutter in March 2020 at [facility], Dr. Nelda Bucks 2. Continue Tikosyn.  We will be responsible for filling the medication here in [place] 3. He has had both atrial fib and atrial flutter ablations.  He has now had a total of 5 procedures over the several years. 4. See above dialogue 5. Continue apixaban therapy 6. Target 130/80 mmHg 7. Not addressed 8. Social distancing, handwashing, and facial mask is encouraged. 9. No firm diagnosis of amyloidosis has been made.  Additional testing to be done at [facility] with repeat PYP scan in 6 to 9 months.   Clinical follow-up in the spring.  Follow  along relative to the diagnosis of amyloid.   Medication Adjustments/Labs and Tests Ordered: Current  medicines are reviewed at length with the patient today.  Concerns regarding medicines are outlined above.  No orders of the defined types were placed in this encounter.  No orders of the defined types were placed in this encounter.   Patient Instructions  Medication Instructions:  Your physician recommends that you continue on your current medications as directed. Please refer to the Current Medication list given to you today.  If you need a refill on your cardiac medications before your next appointment, please call your pharmacy.   Lab work: None If you have labs (blood work) drawn today and your tests are completely normal, you will receive your results only by:  [place] (if you have MyChart) OR  A paper copy in the mail If you have any lab test that is abnormal or we need to change your treatment, we will call you to review the results.  Testing/Procedures: None  Follow-Up: At [facility], you and your health needs are our priority.  As part of our continuing mission to provide you with exceptional heart care, we have created designated Provider Care Teams.  These Care Teams include your primary Cardiologist (physician) and Advanced Practice Providers (APPs -  Physician Assistants and Nurse Practitioners) who all work together to provide you with the care you need, when you need it. You will need a follow up appointment in 9 months.  Please call our office 2 months in advance to schedule this appointment.  You may see Dr. Tamala Julian or one of the following Advanced Practice Providers on your designated Care Team:   Truitt Merle, NP Cecilie Kicks, NP  Kathyrn Drown, NP  Any Other Special Instructions Will Be Listed Below (If Applicable).       Signed, Sinclair Grooms, MD  09/28/2018 3:59 PM    [place]

## 2018-09-28 ENCOUNTER — Other Ambulatory Visit: Payer: Self-pay

## 2018-09-28 ENCOUNTER — Encounter: Payer: Self-pay | Admitting: Interventional Cardiology

## 2018-09-28 ENCOUNTER — Ambulatory Visit (INDEPENDENT_AMBULATORY_CARE_PROVIDER_SITE_OTHER): Payer: Medicare Other | Admitting: Interventional Cardiology

## 2018-09-28 VITALS — BP 136/82 | HR 68 | Ht 70.0 in | Wt 199.1 lb

## 2018-09-28 DIAGNOSIS — Z7901 Long term (current) use of anticoagulants: Secondary | ICD-10-CM | POA: Diagnosis not present

## 2018-09-28 DIAGNOSIS — I484 Atypical atrial flutter: Secondary | ICD-10-CM | POA: Diagnosis not present

## 2018-09-28 DIAGNOSIS — Z79899 Other long term (current) drug therapy: Secondary | ICD-10-CM

## 2018-09-28 DIAGNOSIS — Z8679 Personal history of other diseases of the circulatory system: Secondary | ICD-10-CM

## 2018-09-28 DIAGNOSIS — I43 Cardiomyopathy in diseases classified elsewhere: Secondary | ICD-10-CM | POA: Diagnosis not present

## 2018-09-28 DIAGNOSIS — I10 Essential (primary) hypertension: Secondary | ICD-10-CM

## 2018-09-28 DIAGNOSIS — Z7189 Other specified counseling: Secondary | ICD-10-CM | POA: Diagnosis not present

## 2018-09-28 DIAGNOSIS — Z9889 Other specified postprocedural states: Secondary | ICD-10-CM

## 2018-09-28 DIAGNOSIS — E854 Organ-limited amyloidosis: Secondary | ICD-10-CM

## 2018-09-28 NOTE — Patient Instructions (Signed)
Medication Instructions:  Your physician recommends that you continue on your current medications as directed. Please refer to the Current Medication list given to you today.  If you need a refill on your cardiac medications before your next appointment, please call your pharmacy.   Lab work: None If you have labs (blood work) drawn today and your tests are completely normal, you will receive your results only by: Marland Kitchen MyChart Message (if you have MyChart) OR . A paper copy in the mail If you have any lab test that is abnormal or we need to change your treatment, we will call you to review the results.  Testing/Procedures: None  Follow-Up: At [facility], you and your health needs are our priority.  As part of our continuing mission to provide you with exceptional heart care, we have created designated Provider Care Teams.  These Care Teams include your primary Cardiologist (physician) and Advanced Practice Providers (APPs -  Physician Assistants and Nurse Practitioners) who all work together to provide you with the care you need, when you need it. You will need a follow up appointment in 9 months.  Please call our office 2 months in advance to schedule this appointment.  You may see Dr. Tamala Julian or one of the following Advanced Practice Providers on your designated Care Team:   Truitt Merle, NP Cecilie Kicks, NP . Kathyrn Drown, NP  Any Other Special Instructions Will Be Listed Below (If Applicable).

## 2018-10-13 ENCOUNTER — Other Ambulatory Visit (HOSPITAL_COMMUNITY): Payer: Self-pay | Admitting: *Deleted

## 2018-10-16 ENCOUNTER — Other Ambulatory Visit: Payer: Self-pay

## 2018-10-16 ENCOUNTER — Ambulatory Visit (HOSPITAL_COMMUNITY)
Admission: RE | Admit: 2018-10-16 | Discharge: 2018-10-16 | Disposition: A | Discharge status: Home / Self Care | Payer: Medicare Other | Source: Ambulatory Visit | Attending: Gastroenterology | Admitting: Gastroenterology

## 2018-10-16 LAB — POCT HEMOGLOBIN-HEMACUE: Hemoglobin: 15.1 g/dL (ref 13.0–17.0)

## 2018-10-16 MED ORDER — LIDOCAINE HCL (PF) 1 % IJ SOLN: 2.0000 mL | Freq: Once | INTRAMUSCULAR | Status: DC

## 2018-10-18 DIAGNOSIS — R7303 Prediabetes: Secondary | ICD-10-CM | POA: Diagnosis not present

## 2018-11-16 ENCOUNTER — Telehealth: Payer: Self-pay | Admitting: Interventional Cardiology

## 2018-11-16 MED ORDER — ATORVASTATIN CALCIUM 10 MG PO TABS: 10.0000 mg | ORAL_TABLET | Freq: Every day | ORAL | 1 refills | Status: DC

## 2018-11-16 NOTE — Telephone Encounter (Signed)
Spoke with pt and he was just needing [facility] [address]  Advised I will get that sent in now.  Pt appreciative for call.

## 2018-11-16 NOTE — Telephone Encounter (Signed)
New message  Pt c/o medication issue:  1. Name of Medication: atorvastatin (LIPITOR) 10 MG tablet  2. How are you currently taking this medication (dosage and times per day)? 1 time daily  3. Are you having a reaction (difficulty breathing--STAT)? n/a  4. What is your medication issue? Patient has a question about the transfer of this medication. Please call.

## 2018-12-20 DIAGNOSIS — Z23 Encounter for immunization: Secondary | ICD-10-CM | POA: Diagnosis not present

## 2018-12-27 ENCOUNTER — Telehealth: Payer: Self-pay | Admitting: Interventional Cardiology

## 2018-12-27 NOTE — Telephone Encounter (Signed)
° ° ° °*  STAT* If patient is at the pharmacy, call can be transferred to refill team.   1. Which medications need to be refilled? (please list name of each medication and dose if known)   dofetilide (TIKOSYN) 250 MCG capsule  2. Which pharmacy/location (including street and city if local pharmacy) is medication to be sent to?  [facility] - [place], [place] - [address] AT [place] [address]  3. Do they need a 30 day or 90 day supply? 90   Please contact [place] at [phone]. Patient states that they have been trying to contact our office but have not had anyone be able to respond

## 2018-12-27 NOTE — Telephone Encounter (Signed)
Pt's medication was sent to pt's pharmacy on 06/30/18. I called [facility] at [phone] and the pharmacy tech stated that they did not have the prescription. I gave a verbal order over the phone for medication dofetilide (Tikosyn) 250 mcg capsule, dispensing 180 capsules with 2 refills. Pharmacy tech verbalized understanding.

## 2019-01-03 ENCOUNTER — Encounter

## 2019-01-04 ENCOUNTER — Other Ambulatory Visit: Payer: Self-pay | Admitting: Interventional Cardiology

## 2019-01-04 MED ORDER — LOSARTAN POTASSIUM 100 MG PO TABS: 100.0000 mg | ORAL_TABLET | Freq: Every day | ORAL | 2 refills | Status: DC

## 2019-01-08 DIAGNOSIS — Z79899 Other long term (current) drug therapy: Secondary | ICD-10-CM | POA: Diagnosis not present

## 2019-01-08 DIAGNOSIS — I484 Atypical atrial flutter: Secondary | ICD-10-CM | POA: Diagnosis not present

## 2019-01-08 DIAGNOSIS — Z8679 Personal history of other diseases of the circulatory system: Secondary | ICD-10-CM | POA: Diagnosis not present

## 2019-01-08 DIAGNOSIS — I4819 Other persistent atrial fibrillation: Secondary | ICD-10-CM | POA: Diagnosis not present

## 2019-01-08 DIAGNOSIS — Z9889 Other specified postprocedural states: Secondary | ICD-10-CM | POA: Diagnosis not present

## 2019-01-18 ENCOUNTER — Other Ambulatory Visit: Payer: Self-pay | Admitting: Neurosurgery

## 2019-01-18 DIAGNOSIS — I671 Cerebral aneurysm, nonruptured: Secondary | ICD-10-CM

## 2019-01-29 DIAGNOSIS — I484 Atypical atrial flutter: Secondary | ICD-10-CM | POA: Diagnosis not present

## 2019-01-29 DIAGNOSIS — I4819 Other persistent atrial fibrillation: Secondary | ICD-10-CM | POA: Diagnosis not present

## 2019-02-13 DIAGNOSIS — B078 Other viral warts: Secondary | ICD-10-CM | POA: Diagnosis not present

## 2019-02-20 DIAGNOSIS — R945 Abnormal results of liver function studies: Secondary | ICD-10-CM | POA: Diagnosis not present

## 2019-02-22 DIAGNOSIS — R945 Abnormal results of liver function studies: Secondary | ICD-10-CM | POA: Diagnosis not present

## 2019-03-15 DIAGNOSIS — B078 Other viral warts: Secondary | ICD-10-CM | POA: Diagnosis not present

## 2019-03-23 ENCOUNTER — Other Ambulatory Visit: Payer: Self-pay | Admitting: Interventional Cardiology

## 2019-04-04 ENCOUNTER — Other Ambulatory Visit: Payer: Self-pay | Admitting: Interventional Cardiology

## 2019-04-04 ENCOUNTER — Other Ambulatory Visit: Payer: Self-pay

## 2019-04-04 MED ORDER — APIXABAN 5 MG PO TABS: 5.0000 mg | ORAL_TABLET | Freq: Two times a day (BID) | ORAL | 1 refills | Status: DC

## 2019-04-04 NOTE — Telephone Encounter (Signed)
Pt last saw Dr Tamala Julian 09/28/18, last labs 01/08/19 Creat 0.8, age 78, weight 88.5kg, based on specified criteria pt is on appropriate dosage of Eliquis 5mg  BID.  Will refill rx.

## 2019-04-25 ENCOUNTER — Ambulatory Visit: Payer: Medicare Other | Attending: Internal Medicine

## 2019-04-25 NOTE — Progress Notes (Signed)
   Covid-19 Vaccination Clinic  Name:  Randy Evans    MRN: [id number] DOB: Oct 03, 1940  04/25/2019  [name] was observed post Covid-19 immunization for 15 minutes without incidence. He was provided with Vaccine Information Sheet and instruction to access the V-Safe system.   [name] was instructed to call 911 with any severe reactions post vaccine: Marland Kitchen Difficulty breathing  . Swelling of your face and throat  . A fast heartbeat  . A bad rash all over your body  . Dizziness and weakness

## 2019-05-02 DIAGNOSIS — I671 Cerebral aneurysm, nonruptured: Secondary | ICD-10-CM | POA: Diagnosis not present

## 2019-05-02 DIAGNOSIS — I10 Essential (primary) hypertension: Secondary | ICD-10-CM | POA: Diagnosis not present

## 2019-05-02 DIAGNOSIS — M109 Gout, unspecified: Secondary | ICD-10-CM | POA: Diagnosis not present

## 2019-05-02 DIAGNOSIS — Z0001 Encounter for general adult medical examination with abnormal findings: Secondary | ICD-10-CM | POA: Diagnosis not present

## 2019-05-02 DIAGNOSIS — R7303 Prediabetes: Secondary | ICD-10-CM | POA: Diagnosis not present

## 2019-05-02 DIAGNOSIS — Z79899 Other long term (current) drug therapy: Secondary | ICD-10-CM | POA: Diagnosis not present

## 2019-05-02 DIAGNOSIS — E7800 Pure hypercholesterolemia, unspecified: Secondary | ICD-10-CM | POA: Diagnosis not present

## 2019-05-02 DIAGNOSIS — I480 Paroxysmal atrial fibrillation: Secondary | ICD-10-CM | POA: Diagnosis not present

## 2019-05-07 DIAGNOSIS — Z8679 Personal history of other diseases of the circulatory system: Secondary | ICD-10-CM | POA: Diagnosis not present

## 2019-05-07 DIAGNOSIS — I471 Supraventricular tachycardia: Secondary | ICD-10-CM | POA: Diagnosis not present

## 2019-05-07 DIAGNOSIS — I484 Atypical atrial flutter: Secondary | ICD-10-CM | POA: Diagnosis not present

## 2019-05-07 DIAGNOSIS — I452 Bifascicular block: Secondary | ICD-10-CM | POA: Diagnosis not present

## 2019-05-07 DIAGNOSIS — I517 Cardiomegaly: Secondary | ICD-10-CM | POA: Diagnosis not present

## 2019-05-07 DIAGNOSIS — Z79899 Other long term (current) drug therapy: Secondary | ICD-10-CM | POA: Diagnosis not present

## 2019-05-07 DIAGNOSIS — Z9889 Other specified postprocedural states: Secondary | ICD-10-CM | POA: Diagnosis not present

## 2019-05-07 DIAGNOSIS — I4819 Other persistent atrial fibrillation: Secondary | ICD-10-CM | POA: Diagnosis not present

## 2019-05-10 ENCOUNTER — Other Ambulatory Visit: Payer: Self-pay | Admitting: Interventional Cardiology

## 2019-05-16 ENCOUNTER — Ambulatory Visit: Payer: Medicare Other | Attending: Internal Medicine

## 2019-05-16 DIAGNOSIS — Z23 Encounter for immunization: Secondary | ICD-10-CM | POA: Insufficient documentation

## 2019-05-16 NOTE — Progress Notes (Signed)
   Covid-19 Vaccination Clinic  Name:  Randy Evans    MRN: [id number] DOB: [birth year]  05/16/2019  Mr. Savant was observed post Covid-19 immunization for 15 minutes without incidence. He was provided with Vaccine Information Sheet and instruction to access the V-Safe system.   Mr. Risden was instructed to call 911 with any severe reactions post vaccine: Marland Kitchen Difficulty breathing  . Swelling of your face and throat  . A fast heartbeat  . A bad rash all over your body  . Dizziness and weakness    Immunizations Administered    Name Date Dose VIS Date Route   Pfizer COVID-19 Vaccine 05/16/2019 11:30 AM 0.3 mL 03/16/2019 Intramuscular   Manufacturer: [employer]   Lot: [id number]   [place]: [id number]

## 2019-05-28 DIAGNOSIS — E7800 Pure hypercholesterolemia, unspecified: Secondary | ICD-10-CM | POA: Diagnosis not present

## 2019-05-28 DIAGNOSIS — M109 Gout, unspecified: Secondary | ICD-10-CM | POA: Diagnosis not present

## 2019-05-28 DIAGNOSIS — Z79899 Other long term (current) drug therapy: Secondary | ICD-10-CM | POA: Diagnosis not present

## 2019-05-28 DIAGNOSIS — R7309 Other abnormal glucose: Secondary | ICD-10-CM | POA: Diagnosis not present

## 2019-05-29 DIAGNOSIS — L814 Other melanin hyperpigmentation: Secondary | ICD-10-CM | POA: Diagnosis not present

## 2019-05-29 DIAGNOSIS — L570 Actinic keratosis: Secondary | ICD-10-CM | POA: Diagnosis not present

## 2019-05-29 DIAGNOSIS — L819 Disorder of pigmentation, unspecified: Secondary | ICD-10-CM | POA: Diagnosis not present

## 2019-05-29 DIAGNOSIS — B078 Other viral warts: Secondary | ICD-10-CM | POA: Diagnosis not present

## 2019-05-29 DIAGNOSIS — D229 Melanocytic nevi, unspecified: Secondary | ICD-10-CM | POA: Diagnosis not present

## 2019-05-29 DIAGNOSIS — D1801 Hemangioma of skin and subcutaneous tissue: Secondary | ICD-10-CM | POA: Diagnosis not present

## 2019-05-29 DIAGNOSIS — L821 Other seborrheic keratosis: Secondary | ICD-10-CM | POA: Diagnosis not present

## 2019-05-29 DIAGNOSIS — D485 Neoplasm of uncertain behavior of skin: Secondary | ICD-10-CM | POA: Diagnosis not present

## 2019-05-30 DIAGNOSIS — B079 Viral wart, unspecified: Secondary | ICD-10-CM | POA: Diagnosis not present

## 2019-05-31 ENCOUNTER — Ambulatory Visit
Admission: RE | Admit: 2019-05-31 | Discharge: 2019-05-31 | Disposition: A | Discharge status: Home / Self Care | Payer: Medicare Other | Source: Ambulatory Visit | Attending: Neurosurgery | Admitting: Neurosurgery

## 2019-05-31 ENCOUNTER — Other Ambulatory Visit: Payer: Self-pay

## 2019-05-31 DIAGNOSIS — I671 Cerebral aneurysm, nonruptured: Secondary | ICD-10-CM

## 2019-06-07 DIAGNOSIS — I671 Cerebral aneurysm, nonruptured: Secondary | ICD-10-CM | POA: Diagnosis not present

## 2019-06-08 ENCOUNTER — Other Ambulatory Visit: Payer: Self-pay | Admitting: Neurosurgery

## 2019-06-08 DIAGNOSIS — I671 Cerebral aneurysm, nonruptured: Secondary | ICD-10-CM

## 2019-06-18 ENCOUNTER — Other Ambulatory Visit: Payer: Self-pay | Admitting: Interventional Cardiology

## 2019-06-22 ENCOUNTER — Other Ambulatory Visit (HOSPITAL_COMMUNITY): Payer: Federal, State, Local not specified - PPO

## 2019-06-26 ENCOUNTER — Other Ambulatory Visit (HOSPITAL_COMMUNITY): Payer: Self-pay | Admitting: Registered Nurse

## 2019-06-26 ENCOUNTER — Other Ambulatory Visit: Payer: Self-pay | Admitting: Registered Nurse

## 2019-06-26 ENCOUNTER — Inpatient Hospital Stay (HOSPITAL_COMMUNITY): Admission: RE | Admit: 2019-06-26 | Payer: Federal, State, Local not specified - PPO | Source: Ambulatory Visit

## 2019-06-26 ENCOUNTER — Other Ambulatory Visit: Payer: Self-pay

## 2019-06-26 DIAGNOSIS — I517 Cardiomegaly: Secondary | ICD-10-CM

## 2019-06-27 NOTE — Progress Notes (Signed)
Cardiology Office Note:    Date:  06/28/2019   ID:  Randy Evans, DOB Sep 20, 1940, MRN [id number]  PCP:  Randy Amel, MD  Cardiologist:  Randy Grooms, MD   Referring MD: Randy Amel, MD   Chief Complaint  Patient presents with  . Coronary Artery Disease  . Atrial Fibrillation    History of Present Illness:    Randy Evans is a 79 y.o. male with a hx of paroxysmalatrial fibrillation,paroxysmal atypical atrial flutter, ablation x3, most recently 2013 at [facility] by Dr.Banhson. On dofetilide and wants to DC. Other history includes hemochromatosis, hypertension, and chronic anticoagulation therapy.  Repeat atrial flutter ablation at [facility]  Final thoughts by Dr. Omelia Blackwater 05/2019: "On dofetilide therapy  Plan   The plan includes the following: 1. BMP and magnesium today 2. Patient to message [place] if he wishes to stop dofetilide. If so we will do the following: Send him a 14-day Holter Patient to stop dofetilide when he places the Holter Patient will call us if he believes he has recurrent A. fib 3. I will see him in follow-up in 6 months     "  He has decided that he wants to try being off dofetilide.  He has an upcoming angiogram of his carotids and cerebrovascular circulation to check patency of/size of a cerebral aneurysm.  He will remain on dofetilide until that time.  He will then call us when he is ready to stop dofetilide and we will provide him with a 2-week continuous monitor to screen for evidence of atrial fibrillation off of dofetilide.  This information will be shared with Dr. Omelia Blackwater.  The patient has felt well since his ablation.  He does not feel there has been any recurrence of atrial fibrillation.  When in atrial fibrillation he had fatigue and increased dyspnea on exertion.  Therefore he feels that being in sinus rhythm is a great benefit to him.  An MRI done at [facility]  demonstrated evidence of late gadolinium enhancement raising the possibility of coronary disease.  He has not had a prior infarction.  A amyloid technetium pyrophosphate scan was performed and was equivocal.  The patient asked the question of whether or not he had amyloidosis.     Past Medical History:  Diagnosis Date  . Atrial fibrillation ([place])    Onset 1991 in [place]  . Cerebral aneurysm without rupture    treated with coil embolization 2006  . Gout   . Hemochromatosis    Has seen Teena Irani  . Hypertension     Past Surgical History:  Procedure Laterality Date  . ANEURYSM COILING  2006  . bone spur removal  1951   below left knee  . BRAIN SURGERY    . CARDIAC ELECTROPHYSIOLOGY MAPPING AND ABLATION  01/2008; 07/2008  . CARDIOVERSION  2000; 2008 03/2008; ;02/15/2011   2008; 2009 2012:    Surgeon: Randy Evans., MD;  Location: [place];  Service: Cardiovascular;  Laterality: N/A;  . CARDIOVERSION  08/05/2011   Procedure: CARDIOVERSION;  Surgeon: Randy Reedy, MD;  Location: [place];  Service: Cardiovascular;  Laterality: N/A;  . CATARACT EXTRACTION W/ INTRAOCULAR LENS  IMPLANT, BILATERAL  2009-2010  . IR ANGIO INTRA EXTRACRAN SEL COM CAROTID INNOMINATE UNI L MOD SED  11/09/2016  . IR ANGIO INTRA EXTRACRAN SEL INTERNAL CAROTID UNI R MOD SED  11/09/2016  . LIVER BIOPSY  late 1990's  . TOE  SURGERY      Current Medications: Current Meds  Medication Sig  . acetaminophen (TYLENOL) 500 MG tablet Take 1,000 mg by mouth every 6 (six) hours as needed. pain   . allopurinol (ZYLOPRIM) 300 MG tablet Take 300 mg by mouth daily.    Marland Kitchen amLODipine (NORVASC) 5 MG tablet TAKE 1 TABLET BY MOUTH EVERY DAY  . apixaban (ELIQUIS) 5 MG TABS tablet Take 1 tablet (5 mg total) by mouth 2 (two) times daily.  Marland Kitchen atorvastatin (LIPITOR) 10 MG tablet TAKE 1 TABLET BY MOUTH EVERY DAY  . chlorpheniramine (CHLOR-TRIMETON) 4 MG tablet Take 4 mg by mouth 2 (two) times daily as needed. allergies   . dofetilide  (TIKOSYN) 250 MCG capsule Take 1 capsule (250 mcg total) by mouth 2 (two) times daily.  Marland Kitchen losartan (COZAAR) 100 MG tablet Take 1 tablet (100 mg total) by mouth daily.  . metoprolol succinate (TOPROL-XL) 50 MG 24 hr tablet TAKE 1 TABLET (50 MG TOTAL) BY MOUTH DAILY. TAKE WITH OR IMMEDIATELY FOLLOWING A MEAL.  . multivitamin-lutein (OCUVITE-LUTEIN) CAPS Take 1 capsule by mouth daily.       Allergies:   Codeine and Sulfa antibiotics   Social History   Socioeconomic History  . Marital status: Married    Spouse name: Not on file  . Number of children: Not on file  . Years of education: Not on file  . Highest education level: Not on file  Occupational History  . Not on file  Tobacco Use  . Smoking status: Former Smoker    Years: 4.00    Types: Cigarettes, Pipe, Cigars  . Smokeless tobacco: Never Used  . Tobacco comment: quit smoking 1966  Substance and Sexual Activity  . Alcohol use: Yes    Alcohol/week: 2.0 standard drinks    Types: 2 Cans of beer per week  . Drug use: No  . Sexual activity: Yes  Other Topics Concern  . Not on file  Social History Narrative   Retired from [employer].  Moved from [place]. Graduate of [employer].  Married.   Social Determinants of Health   Financial Resource Strain:   . Difficulty of Paying Living Expenses:   Food Insecurity:   . Worried About Charity fundraiser in the Last Year:   . Arboriculturist in the Last Year:   Transportation Needs:   . Film/video editor (Medical):   Marland Kitchen Lack of Transportation (Non-Medical):   Physical Activity:   . Days of Exercise per Week:   . Minutes of Exercise per Session:   Stress:   . Feeling of Stress :   Social Connections:   . Frequency of Communication with Friends and Family:   . Frequency of Social Gatherings with Friends and Family:   . Attends Religious Services:   . Active Member of Clubs or Organizations:   . Attends Archivist Meetings:   Marland Kitchen Marital Status:      Family History: The  patient's family history is not on file.  ROS:   Please see the history of present illness.    Some decrease in memory.  Physical activity has been nil during the COVID-19 pandemic.  He has not been exercising.  All other systems reviewed and are negative.  EKGs/Labs/Other Studies Reviewed:    The following studies were reviewed today: I reviewed the MRI, taking these in pyrophosphate amyloid scan, and the ablation data from [facility].  This was done personally.  EKG:  EKG a new EKG performed today demonstrates sinus rhythm, first-degree AV block, right bundle branch block, left axis deviation.  No significant changes noted when compared to prior.  Recent Labs: 10/16/2018: Hemoglobin 15.1  Recent Lipid Panel No results found for: CHOL, TRIG, HDL, CHOLHDL, VLDL, LDLCALC, LDLDIRECT  Physical Exam:    VS:  BP (!) 152/88   Pulse 71   Ht 5\' 10"  (1.778 m)   Wt 207 lb 6.4 oz (94.1 kg)   SpO2 97%   BMI 29.76 kg/m     Wt Readings from Last 3 Encounters:  06/28/19 207 lb 6.4 oz (94.1 kg)  10/16/18 195 lb (88.5 kg)  09/28/18 199 lb 1.9 oz (90.3 kg)     GEN: Moderate obesity. No acute distress HEENT: Normal NECK: No JVD. LYMPHATICS: No lymphadenopathy CARDIAC:  RRR without murmur, gallop, or edema. VASCULAR:  Normal Pulses. No bruits. RESPIRATORY:  Clear to auscultation without rales, wheezing or rhonchi  ABDOMEN: Soft, non-tender, non-distended, No pulsatile mass, MUSCULOSKELETAL: No deformity  SKIN: Warm and dry NEUROLOGIC:  Alert and oriented x 3 PSYCHIATRIC:  Normal affect   ASSESSMENT:    1. On dofetilide therapy   2. Long term current use of anticoagulant therapy   3. S/P ablation of atrial fibrillation   4. S/P ablation of atrial flutter   5. Essential hypertension   6. Educated about COVID-19 virus infection   7. CAD in native artery    PLAN:    In order of problems listed above:  1. He will continue dofetilide for the time being.  He will  stop it later this spring and a 2-week monitor will be done to exclude recurrent atrial for ablation.  I condone the process of dofetilide discontinuation.  He understands that if he has recurrent A. fib he will need electrical cardioversion and may need to resume the medication.  Or as has been suggested by Dr. Nelda Bucks, perhaps a different agent such as [place] could be used. 2. Continue apixaban but hold at least 48 hours prior to the upcoming angiogram. 3. Most recently performed and successful. 4. Remote 5. Blood pressure control is now good.  We need to consider up titration of therapy.  Target is less than 130/80 mmHg.  May need to add low-dose diuretic therapy. 6. He has received the COVID-19 vaccine and is practicing social distancing. 7. The MRI of the heart performed a year ago suggested possibility of prior subendocardial cardial infarction.  We will plan to repeat the study looking for stability of late gadolinium enhancement substrate as noted a year ago.  May need to have an ischemic evaluation.  He is totally asymptomatic with reference to the possibility of angina.   Medication Adjustments/Labs and Tests Ordered: Current medicines are reviewed at length with the patient today.  Concerns regarding medicines are outlined above.  Orders Placed This Encounter  Procedures  . MR Card Morphology Wo/W Cm  . EKG 12-Lead   No orders of the defined types were placed in this encounter.   Patient Instructions  Medication Instructions:  Your physician recommends that you continue on your current medications as directed. Please refer to the Current Medication list given to you today.  *If you need a refill on your cardiac medications before your next appointment, please call your pharmacy*   Lab Work: None If you have labs (blood work) drawn today and your tests are completely normal, you will receive your results only by: Marland Kitchen MyChart Message (if you have  MyChart) OR . A paper copy  in the mail If you have any lab test that is abnormal or we need to change your treatment, we will call you to review the results.   Testing/Procedures: Your physician recommends that you have a Cardiac MRI performed.   Follow-Up: At [facility], you and your health needs are our priority.  As part of our continuing mission to provide you with exceptional heart care, we have created designated Provider Care Teams.  These Care Teams include your primary Cardiologist (physician) and Advanced Practice Providers (APPs -  Physician Assistants and Nurse Practitioners) who all work together to provide you with the care you need, when you need it.  We recommend signing up for the patient portal called "MyChart".  Sign up information is provided on this After Visit Summary.  MyChart is used to connect with patients for Virtual Visits (Telemedicine).  Patients are able to view lab/test results, encounter notes, upcoming appointments, etc.  Non-urgent messages can be sent to your provider as well.   To learn more about what you can do with MyChart, go to [email or web address].    Your next appointment:   6 month(s)  The format for your next appointment:   In Person  Provider:   You may see Randy Grooms, MD or one of the following Advanced Practice Providers on your designated Care Team:    Truitt Merle, NP  Cecilie Kicks, NP  Kathyrn Drown, NP    Other Instructions      Signed, Randy Grooms, MD  06/28/2019 8:40 AM    [place]

## 2019-06-28 ENCOUNTER — Encounter: Payer: Self-pay | Admitting: Interventional Cardiology

## 2019-06-28 ENCOUNTER — Ambulatory Visit (INDEPENDENT_AMBULATORY_CARE_PROVIDER_SITE_OTHER): Payer: Medicare Other | Admitting: Interventional Cardiology

## 2019-06-28 ENCOUNTER — Other Ambulatory Visit: Payer: Self-pay

## 2019-06-28 VITALS — BP 152/88 | HR 71 | Ht 70.0 in | Wt 207.4 lb

## 2019-06-28 DIAGNOSIS — Z7901 Long term (current) use of anticoagulants: Secondary | ICD-10-CM | POA: Diagnosis not present

## 2019-06-28 DIAGNOSIS — I10 Essential (primary) hypertension: Secondary | ICD-10-CM | POA: Diagnosis not present

## 2019-06-28 DIAGNOSIS — Z79899 Other long term (current) drug therapy: Secondary | ICD-10-CM | POA: Diagnosis not present

## 2019-06-28 DIAGNOSIS — Z7189 Other specified counseling: Secondary | ICD-10-CM | POA: Diagnosis not present

## 2019-06-28 DIAGNOSIS — Z9889 Other specified postprocedural states: Secondary | ICD-10-CM

## 2019-06-28 DIAGNOSIS — Z8679 Personal history of other diseases of the circulatory system: Secondary | ICD-10-CM | POA: Diagnosis not present

## 2019-06-28 DIAGNOSIS — I2510 Atherosclerotic heart disease of native coronary artery without angina pectoris: Secondary | ICD-10-CM

## 2019-06-28 NOTE — Patient Instructions (Signed)
Medication Instructions:  Your physician recommends that you continue on your current medications as directed. Please refer to the Current Medication list given to you today.  *If you need a refill on your cardiac medications before your next appointment, please call your pharmacy*   Lab Work: None If you have labs (blood work) drawn today and your tests are completely normal, you will receive your results only by: Marland Kitchen MyChart Message (if you have MyChart) OR . A paper copy in the mail If you have any lab test that is abnormal or we need to change your treatment, we will call you to review the results.   Testing/Procedures: Your physician recommends that you have a Cardiac MRI performed.   Follow-Up: At [facility], you and your health needs are our priority.  As part of our continuing mission to provide you with exceptional heart care, we have created designated Provider Care Teams.  These Care Teams include your primary Cardiologist (physician) and Advanced Practice Providers (APPs -  Physician Assistants and Nurse Practitioners) who all work together to provide you with the care you need, when you need it.  We recommend signing up for the patient portal called "MyChart".  Sign up information is provided on this After Visit Summary.  MyChart is used to connect with patients for Virtual Visits (Telemedicine).  Patients are able to view lab/test results, encounter notes, upcoming appointments, etc.  Non-urgent messages can be sent to your provider as well.   To learn more about what you can do with MyChart, go to [email or web address].    Your next appointment:   6 month(s)  The format for your next appointment:   In Person  Provider:   You may see Sinclair Grooms, MD or one of the following Advanced Practice Providers on your designated Care Team:    Truitt Merle, NP  Cecilie Kicks, NP  Kathyrn Drown, NP    Other Instructions

## 2019-07-03 ENCOUNTER — Encounter: Payer: Self-pay | Admitting: Interventional Cardiology

## 2019-07-03 ENCOUNTER — Telehealth: Payer: Self-pay | Admitting: *Deleted

## 2019-07-03 NOTE — Telephone Encounter (Signed)
Spoke with patient regarding appointment for Cardiac MRI scheduled Monday 08/06/19 at 12:00 pm at [facility]---arrival time is 11:15 am 1st floor radiology department.  Will mail information to patient and it is also in My Chart

## 2019-07-04 ENCOUNTER — Other Ambulatory Visit: Payer: Self-pay | Admitting: Neurosurgery

## 2019-07-10 DIAGNOSIS — L814 Other melanin hyperpigmentation: Secondary | ICD-10-CM | POA: Diagnosis not present

## 2019-07-10 DIAGNOSIS — L821 Other seborrheic keratosis: Secondary | ICD-10-CM | POA: Diagnosis not present

## 2019-07-10 DIAGNOSIS — B078 Other viral warts: Secondary | ICD-10-CM | POA: Diagnosis not present

## 2019-07-10 DIAGNOSIS — L570 Actinic keratosis: Secondary | ICD-10-CM | POA: Diagnosis not present

## 2019-07-10 DIAGNOSIS — D229 Melanocytic nevi, unspecified: Secondary | ICD-10-CM | POA: Diagnosis not present

## 2019-07-12 ENCOUNTER — Encounter (HOSPITAL_COMMUNITY): Payer: Federal, State, Local not specified - PPO

## 2019-07-12 ENCOUNTER — Encounter (HOSPITAL_COMMUNITY)
Admission: RE | Admit: 2019-07-12 | Discharge: 2019-07-12 | Disposition: A | Discharge status: Home / Self Care | Payer: Medicare Other | Source: Ambulatory Visit | Attending: Registered Nurse | Admitting: Registered Nurse

## 2019-07-12 ENCOUNTER — Other Ambulatory Visit: Payer: Self-pay

## 2019-07-12 DIAGNOSIS — I509 Heart failure, unspecified: Secondary | ICD-10-CM | POA: Diagnosis not present

## 2019-07-12 DIAGNOSIS — I517 Cardiomegaly: Secondary | ICD-10-CM | POA: Diagnosis not present

## 2019-07-12 MED ORDER — TECHNETIUM TC 99M PYROPHOSPHATE
19.4000 | Freq: Once | INTRAVENOUS | Status: AC | PRN
  Administered 2019-07-12: 10:00:00 19.4 via INTRAVENOUS
  Filled 2019-07-12: qty 20

## 2019-07-13 ENCOUNTER — Other Ambulatory Visit (HOSPITAL_COMMUNITY)
Admission: RE | Admit: 2019-07-13 | Discharge: 2019-07-13 | Disposition: A | Discharge status: Home / Self Care | Payer: Medicare Other | Source: Ambulatory Visit | Attending: Neurosurgery | Admitting: Neurosurgery

## 2019-07-13 DIAGNOSIS — Z20822 Contact with and (suspected) exposure to covid-19: Secondary | ICD-10-CM | POA: Diagnosis not present

## 2019-07-13 DIAGNOSIS — Z01812 Encounter for preprocedural laboratory examination: Secondary | ICD-10-CM | POA: Diagnosis not present

## 2019-07-13 LAB — SARS CORONAVIRUS 2 (TAT 6-24 HRS): SARS Coronavirus 2: NEGATIVE

## 2019-07-16 NOTE — H&P (Addendum)
Chief Complaint   Aneurysm   HPI   HPI: Randy Evans is a 79 y.o. male who underwent elective coil embolization of anterior communicating artery aneurysm back in 2006 at [facility] with known small, irregularly appearing residual/recurrent aneurysm. A repeat MRA has shown enlargement of the aneurysm residual.  He presents today for diagnostic cerebral angiogram for further characterize station of the aneurysm.  He is without any concerns  Patient Active Problem List   Diagnosis Date Noted  . Plantar fasciitis 01/24/2017  . Atrial fibrillation ([place])   . Hypertension   . Long term current use of anticoagulant therapy   . Hemochromatosis   . Cerebral aneurysm, nonruptured   . Gout     PMH: Past Medical History:  Diagnosis Date  . Atrial fibrillation ([place])    Onset 1991 in [place]  . Cerebral aneurysm without rupture    treated with coil embolization 2006  . Gout   . Hemochromatosis    Has seen Teena Irani  . Hypertension     PSH: Past Surgical History:  Procedure Laterality Date  . ANEURYSM COILING  2006  . bone spur removal  1951   below left knee  . BRAIN SURGERY    . CARDIAC ELECTROPHYSIOLOGY MAPPING AND ABLATION  01/2008; 07/2008  . CARDIOVERSION  2000; 2008 03/2008; ;02/15/2011   2008; 2009 2012:    Surgeon: Kerry Hough., MD;  Location: [place];  Service: Cardiovascular;  Laterality: N/A;  . CARDIOVERSION  08/05/2011   Procedure: CARDIOVERSION;  Surgeon: Jacolyn Reedy, MD;  Location: [place];  Service: Cardiovascular;  Laterality: N/A;  . CATARACT EXTRACTION W/ INTRAOCULAR LENS  IMPLANT, BILATERAL  2009-2010  . IR ANGIO INTRA EXTRACRAN SEL COM CAROTID INNOMINATE UNI L MOD SED  11/09/2016  . IR ANGIO INTRA EXTRACRAN SEL INTERNAL CAROTID UNI R MOD SED  11/09/2016  . LIVER BIOPSY  late 1990's  . TOE SURGERY      (Not in a hospital admission)   SH: Social History   Tobacco Use  . Smoking status: Former Smoker    Years: 4.00    Types: Cigarettes,  Pipe, Cigars  . Smokeless tobacco: Never Used  . Tobacco comment: quit smoking 1966  Substance Use Topics  . Alcohol use: Yes    Alcohol/week: 2.0 standard drinks    Types: 2 Cans of beer per week  . Drug use: No    MEDS: Prior to Admission medications   Medication Sig Start Date End Date Taking? Authorizing Provider  acetaminophen (TYLENOL) 500 MG tablet Take 1,000 mg by mouth every 6 (six) hours as needed. pain     [provider]  allopurinol (ZYLOPRIM) 300 MG tablet Take 300 mg by mouth daily.      [provider]  amLODipine (NORVASC) 5 MG tablet TAKE 1 TABLET BY MOUTH EVERY DAY 06/18/19   Belva Crome, MD  apixaban (ELIQUIS) 5 MG TABS tablet Take 1 tablet (5 mg total) by mouth 2 (two) times daily. 04/04/19   Belva Crome, MD  atorvastatin (LIPITOR) 10 MG tablet TAKE 1 TABLET BY MOUTH EVERY DAY 05/10/19   Belva Crome, MD  chlorpheniramine (CHLOR-TRIMETON) 4 MG tablet Take 4 mg by mouth 2 (two) times daily as needed. allergies     [provider]  dofetilide (TIKOSYN) 250 MCG capsule Take 1 capsule (250 mcg total) by mouth 2 (two) times daily. 06/30/18   Belva Crome, MD  losartan (COZAAR) 100 MG  tablet Take 1 tablet (100 mg total) by mouth daily. 01/04/19   Belva Crome, MD  metoprolol succinate (TOPROL-XL) 50 MG 24 hr tablet TAKE 1 TABLET (50 MG TOTAL) BY MOUTH DAILY. TAKE WITH OR IMMEDIATELY FOLLOWING A MEAL. 03/26/19   Belva Crome, MD  multivitamin-lutein [facility]) CAPS Take 1 capsule by mouth daily.      [provider]    ALLERGY: Allergies  Allergen Reactions  . Codeine Itching  . Sulfa Antibiotics Other (See Comments)    Reactions: nausea, shaking of the body    Social History   Tobacco Use  . Smoking status: Former Smoker    Years: 4.00    Types: Cigarettes, Pipe, Cigars  . Smokeless tobacco: Never Used  . Tobacco comment: quit smoking 1966  Substance Use Topics  . Alcohol use: Yes    Alcohol/week: 2.0  standard drinks    Types: 2 Cans of beer per week     No family history on file.   ROS   ROS  Exam   There were no vitals filed for this visit. General appearance: WDWN, NAD Eyes: No scleral injection Cardiovascular: Regular rate and rhythm without murmurs, rubs, gallops. No edema or variciosities. Distal pulses normal. Pulmonary: Effort normal, non-labored breathing Musculoskeletal:     Muscle tone upper extremities: Normal    Muscle tone lower extremities: Normal    Motor exam: Upper Extremities Deltoid Bicep Tricep Grip  Right 5/5 5/5 5/5 5/5  Left 5/5 5/5 5/5 5/5   Lower Extremity IP Quad PF DF EHL  Right 5/5 5/5 5/5 5/5 5/5  Left 5/5 5/5 5/5 5/5 5/5   Neurological Mental Status:    - Patient is awake, alert, oriented to person, place, month, year, and situation    - Patient is able to give a clear and coherent history.    - No signs of aphasia or neglect Cranial Nerves    - II: Visual Fields are full. PERRL    - III/IV/VI: EOMI without ptosis or diploplia.     - V: Facial sensation is grossly normal    - VII: Facial movement is symmetric.     - VIII: hearing is intact to voice    - X: Uvula elevates symmetrically    - XI: Shoulder shrug is symmetric.    - XII: tongue is midline without atrophy or fasciculations.  Sensory: Sensation grossly intact to LT  Results - Imaging/Labs   No results found for this or any previous visit (from the past 48 hour(s)).  No results found.  IMAGING: MRA of the brain dated 05/31/2019 was personally reviewed.  This does appear to demonstrate a enlargement in the aneurysm residual at the anterior communicating artery, measuring approximately 4 mm in height.  Review of previous angiogram indicates the aneurysm previously measured approximately 2.5 mm in height.  This is in the setting of a dominant right A1.  Impression/Plan   79 y.o. male with apparent enlargement of aneurysm residual/recurrence after previous elective coil  embolization back in 2006.   We will proceed with diagnostic cerebral angiogram to assess the morphology of the aneurysm recurrence.  While in the office risks, benefits and alternatives were discussed.  Patient stated understanding and wished to proceed. All questions today were answered.  Consuella Lose, MD [facility]

## 2019-07-17 ENCOUNTER — Other Ambulatory Visit: Payer: Self-pay

## 2019-07-17 ENCOUNTER — Other Ambulatory Visit: Payer: Self-pay | Admitting: Neurosurgery

## 2019-07-17 ENCOUNTER — Ambulatory Visit (HOSPITAL_COMMUNITY)
Admission: RE | Admit: 2019-07-17 | Discharge: 2019-07-17 | Disposition: A | Discharge status: Home / Self Care | Payer: Medicare Other | Source: Ambulatory Visit | Attending: Neurosurgery | Admitting: Neurosurgery

## 2019-07-17 DIAGNOSIS — Z48812 Encounter for surgical aftercare following surgery on the circulatory system: Secondary | ICD-10-CM | POA: Diagnosis not present

## 2019-07-17 DIAGNOSIS — Z79899 Other long term (current) drug therapy: Secondary | ICD-10-CM | POA: Insufficient documentation

## 2019-07-17 DIAGNOSIS — Z7901 Long term (current) use of anticoagulants: Secondary | ICD-10-CM | POA: Insufficient documentation

## 2019-07-17 DIAGNOSIS — Z882 Allergy status to sulfonamides status: Secondary | ICD-10-CM | POA: Insufficient documentation

## 2019-07-17 DIAGNOSIS — I671 Cerebral aneurysm, nonruptured: Secondary | ICD-10-CM | POA: Insufficient documentation

## 2019-07-17 DIAGNOSIS — M109 Gout, unspecified: Secondary | ICD-10-CM | POA: Insufficient documentation

## 2019-07-17 DIAGNOSIS — Z87891 Personal history of nicotine dependence: Secondary | ICD-10-CM | POA: Diagnosis not present

## 2019-07-17 DIAGNOSIS — I10 Essential (primary) hypertension: Secondary | ICD-10-CM | POA: Diagnosis not present

## 2019-07-17 DIAGNOSIS — Z885 Allergy status to narcotic agent status: Secondary | ICD-10-CM | POA: Diagnosis not present

## 2019-07-17 DIAGNOSIS — I4891 Unspecified atrial fibrillation: Secondary | ICD-10-CM | POA: Insufficient documentation

## 2019-07-17 HISTORY — PX: IR US GUIDE VASC ACCESS RIGHT: IMG2390

## 2019-07-17 HISTORY — PX: IR ANGIO INTRA EXTRACRAN SEL COM CAROTID INNOMINATE UNI R MOD SED: IMG5359

## 2019-07-17 LAB — CBC WITH DIFFERENTIAL/PLATELET
Abs Immature Granulocytes: 0.01 10*3/uL (ref 0.00–0.07)
Basophils Absolute: 0 10*3/uL (ref 0.0–0.1)
Basophils Relative: 0 %
Eosinophils Absolute: 0.2 10*3/uL (ref 0.0–0.5)
Eosinophils Relative: 3 %
HCT: 50.1 % (ref 39.0–52.0)
Hemoglobin: 16.8 g/dL (ref 13.0–17.0)
Immature Granulocytes: 0 %
Lymphocytes Relative: 20 %
Lymphs Abs: 1.3 10*3/uL (ref 0.7–4.0)
MCH: 31.9 pg (ref 26.0–34.0)
MCHC: 33.5 g/dL (ref 30.0–36.0)
MCV: 95.2 fL (ref 80.0–100.0)
Monocytes Absolute: 0.9 10*3/uL (ref 0.1–1.0)
Monocytes Relative: 13 %
Neutro Abs: 4.1 10*3/uL (ref 1.7–7.7)
Neutrophils Relative %: 64 %
Platelets: 173 10*3/uL (ref 150–400)
RBC: 5.26 MIL/uL (ref 4.22–5.81)
RDW: 13.2 % (ref 11.5–15.5)
WBC: 6.5 10*3/uL (ref 4.0–10.5)
nRBC: 0 % (ref 0.0–0.2)

## 2019-07-17 LAB — BASIC METABOLIC PANEL
Anion gap: 9 (ref 5–15)
BUN: 14 mg/dL (ref 8–23)
CO2: 24 mmol/L (ref 22–32)
Calcium: 8.9 mg/dL (ref 8.9–10.3)
Chloride: 104 mmol/L (ref 98–111)
Creatinine, Ser: 0.78 mg/dL (ref 0.61–1.24)
GFR calc Af Amer: >60 mL/min (ref >60)
GFR calc non Af Amer: >60 mL/min (ref >60)
Glucose, Bld: 160 mg/dL — ABNORMAL HIGH (ref 70–99)
Potassium: 4 mmol/L (ref 3.5–5.1)
Sodium: 137 mmol/L (ref 135–145)

## 2019-07-17 LAB — PROTIME-INR
INR: 1.2 (ref 0.8–1.2)
Prothrombin Time: 15.5 seconds — ABNORMAL HIGH (ref 11.4–15.2)

## 2019-07-17 LAB — APTT: aPTT: 33 seconds (ref 24–36)

## 2019-07-17 MED ORDER — SODIUM CHLORIDE 0.9 % IV SOLN: INTRAVENOUS | Status: DC

## 2019-07-17 MED ORDER — NITROGLYCERIN 1 MG/10 ML FOR IR/CATH LAB
INTRA_ARTERIAL | Status: AC
  Filled 2019-07-17: qty 10

## 2019-07-17 MED ORDER — SODIUM CHLORIDE 0.9 % IV SOLN
INTRAVENOUS | Status: AC | PRN
  Administered 2019-07-17: 10 mL/h via INTRAVENOUS

## 2019-07-17 MED ORDER — FENTANYL CITRATE (PF) 100 MCG/2ML IJ SOLN
INTRAMUSCULAR | Status: AC
  Filled 2019-07-17: qty 2

## 2019-07-17 MED ORDER — HEPARIN SODIUM (PORCINE) 1000 UNIT/ML IJ SOLN
INTRAMUSCULAR | Status: AC
  Filled 2019-07-17: qty 1

## 2019-07-17 MED ORDER — MIDAZOLAM HCL 2 MG/2ML IJ SOLN
INTRAMUSCULAR | Status: AC | PRN
  Administered 2019-07-17: 1 mg via INTRAVENOUS

## 2019-07-17 MED ORDER — MIDAZOLAM HCL 2 MG/2ML IJ SOLN
INTRAMUSCULAR | Status: AC
  Filled 2019-07-17: qty 2

## 2019-07-17 MED ORDER — LIDOCAINE HCL 1 % IJ SOLN
INTRAMUSCULAR | Status: AC
  Filled 2019-07-17: qty 20

## 2019-07-17 MED ORDER — HEPARIN SODIUM (PORCINE) 1000 UNIT/ML IJ SOLN
INTRAMUSCULAR | Status: AC | PRN
  Administered 2019-07-17: 1000 [IU] via INTRAVENOUS

## 2019-07-17 MED ORDER — VERAPAMIL HCL 2.5 MG/ML IV SOLN
INTRAVENOUS | Status: AC | PRN
  Administered 2019-07-17: 2.5 mg via INTRA_ARTERIAL

## 2019-07-17 MED ORDER — FENTANYL CITRATE (PF) 100 MCG/2ML IJ SOLN
INTRAMUSCULAR | Status: AC | PRN
  Administered 2019-07-17: 25 ug via INTRAVENOUS

## 2019-07-17 MED ORDER — VERAPAMIL HCL 2.5 MG/ML IV SOLN
INTRAVENOUS | Status: AC
  Filled 2019-07-17: qty 2

## 2019-07-17 MED ORDER — NITROGLYCERIN 1 MG/10 ML FOR IR/CATH LAB
INTRA_ARTERIAL | Status: AC | PRN
  Administered 2019-07-17: 400 ug via INTRA_ARTERIAL
  Administered 2019-07-17: 100 ug via INTRA_ARTERIAL

## 2019-07-17 MED ORDER — HYDROCODONE-ACETAMINOPHEN 5-325 MG PO TABS: 1.0000 | ORAL_TABLET | ORAL | Status: DC | PRN

## 2019-07-17 MED ORDER — IOHEXOL 300 MG/ML  SOLN
100.0000 mL | Freq: Once | INTRAMUSCULAR | Status: AC | PRN
  Administered 2019-07-17: 09:00:00 25 mL via INTRA_ARTERIAL

## 2019-07-17 NOTE — Discharge Instructions (Addendum)
Radial Site Care  This sheet gives you information about how to care for yourself after your procedure. Your health care provider may also give you more specific instructions. If you have problems or questions, contact your health care provider. What can I expect after the procedure? After the procedure, it is common to have:  Bruising and tenderness at the catheter insertion area. Follow these instructions at home: Medicines  Take over-the-counter and prescription medicines only as told by your health care provider. Insertion site care  Follow instructions from your health care provider about how to take care of your insertion site. Make sure you: ? Wash your hands with soap and water before you change your bandage (dressing). If soap and water are not available, use hand sanitizer. ? Change your dressing as told by your health care provider. ? Leave stitches (sutures), skin glue, or adhesive strips in place. These skin closures may need to stay in place for 2 weeks or longer. If adhesive strip edges start to loosen and curl up, you may trim the loose edges. Do not remove adhesive strips completely unless your health care provider tells you to do that.  Check your insertion site every day for signs of infection. Check for: ? Redness, swelling, or pain. ? Fluid or blood. ? Pus or a bad smell. ? Warmth.  Do not take baths, swim, or use a hot tub until your health care provider approves.  You may shower 24-48 hours after the procedure, or as directed by your health care provider. ? Remove the dressing and gently wash the site with plain soap and water. ? Pat the area dry with a clean towel. ? Do not rub the site. That could cause bleeding.  Do not apply powder or lotion to the site. Activity   For 24 hours after the procedure, or as directed by your health care provider: ? Do not flex or bend the affected arm. ? Do not push or pull heavy objects with the affected arm. ? Do not  drive yourself home from the hospital or clinic. You may drive 24 hours after the procedure unless your health care provider tells you not to. ? Do not operate machinery or power tools.  Do not lift anything that is heavier than 10 lb (4.5 kg), or the limit that you are told, until your health care provider says that it is safe.  Ask your health care provider when it is okay to: ? Return to work or school. ? Resume usual physical activities or sports. ? Resume sexual activity. General instructions  If the catheter site starts to bleed, raise your arm and put firm pressure on the site. If the bleeding does not stop, get help right away. This is a medical emergency.  If you went home on the same day as your procedure, a responsible adult should be with you for the first 24 hours after you arrive home.  Keep all follow-up visits as told by your health care provider. This is important. Contact a health care provider if:  You have a fever.  You have redness, swelling, or yellow drainage around your insertion site. Get help right away if:  You have unusual pain at the radial site.  The catheter insertion area swells very fast.  The insertion area is bleeding, and the bleeding does not stop when you hold steady pressure on the area.  Your arm or hand becomes pale, cool, tingly, or numb. These symptoms may represent a serious problem   that is an emergency. Do not wait to see if the symptoms will go away. Get medical help right away. Call your local emergency services (911 in the [place] Do not drive yourself to the hospital. Summary  After the procedure, it is common to have bruising and tenderness at the site.  Follow instructions from your health care provider about how to take care of your radial site wound. Check the wound every day for signs of infection.  Do not lift anything that is heavier than 10 lb (4.5 kg), or the limit that you are told, until your health care provider says  that it is safe. This information is not intended to replace advice given to you by your health care provider. Make sure you discuss any questions you have with your health care provider. Document Revised: 04/27/2017 Document Reviewed: 04/27/2017 Elsevier Patient Education  2020 Elsevier Inc. Cerebral Angiogram, Care After This sheet gives you information about how to care for yourself after your procedure. Your health care provider may also give you more specific instructions. If you have problems or questions, contact your health care provider. What can I expect after the procedure? After the procedure, it is common to have:  Bruising and tenderness at the catheter insertion site.  A mild headache. Follow these instructions at home: Insertion site care  Follow instructions from your health care provider about how to take care of the insertion site. Make sure you: ? Wash your hands with soap and water before and after you change your bandage (dressing). If soap and water are not available, use hand sanitizer. ? Change your dressing as told by your health care provider.  Do not take baths, swim, or use a hot tub until your health care provider approves. You may shower 24-48 hours after the procedure, or as told by your health care provider.  To clean your insertion site: ? Gently wash the site with plain soap and water. ? Pat the area dry with a clean towel. ? Do not rub the site. This may cause bleeding.  Do not apply powder or lotion to the site. Keep the site clean and dry. Infection signs Check your incision area every day for signs of infection. Check for:  Redness, swelling, or pain.  Fluid or blood.  Warmth.  Pus or a bad smell.  Activity  Do not drive for 24 hours if you were given a sedative during your procedure.  Rest as told by your health care provider.  Do not lift anything that is heavier than 10 lb (4.5 kg), or the limit that you are told, until your  health care provider says that it is safe.  Return to your normal activities as told by your health care provider, usually in about a week. Ask your health care provider what activities are safe for you. General instructions   If your insertion site starts to bleed, lie flat and put pressure on the site. If the bleeding does not stop, get help right away. This is a medical emergency.  Do not use any products that contain nicotine or tobacco, such as cigarettes, e-cigarettes, and chewing tobacco. If you need help quitting, ask your health care provider.  Take over-the-counter and prescription medicines only as told by your health care provider.  Drink enough fluid to keep your urine pale yellow. This helps flush the contrast dye from your body.  Keep all follow-up visits as directed by your health care provider. This is important. Contact a health   care provider if:  You have a fever or chills.  You have redness, swelling, or pain around your insertion site.  You have fluid or blood coming from your insertion site.  The insertion site feels warm to the touch.  You have pus or a bad smell coming from your insertion site.  You notice blood collecting in the tissue around the insertion site (hematoma). The hematoma may be painful to the touch. Get help right away if:  You have chest pain or trouble breathing.  You have severe pain or swelling at the insertion site.  The insertion area bleeds, and bleeding continues after 30 minutes of holding steady pressure on the site.  The arm or leg where the catheter was inserted is pale, cold, numb, tingling, or weak.  You have a rash.  You have any symptoms of a stroke. "BE FAST" is an easy way to remember the main warning signs of a stroke: ? B - Balance. Signs are dizziness, sudden trouble walking, or loss of balance. ? E - Eyes. Signs are trouble seeing or a sudden change in vision. ? F - Face. Signs are sudden weakness or numbness of  the face, or the face or eyelid drooping on one side. ? A - Arms. Signs are weakness or numbness in an arm. This happens suddenly and usually on one side of the body. ? S - Speech. Signs are sudden trouble speaking, slurred speech, or trouble understanding what people say. ? T - Time. Time to call emergency services. Write down what time symptoms started.  You have other signs of a stroke, such as: ? A sudden, severe headache with no known cause. ? Nausea or vomiting. ? Seizure. These symptoms may represent a serious problem that is an emergency. Do not wait to see if the symptoms will go away. Get medical help right away. Call your local emergency services (911 in the [place] Do not drive yourself to the hospital. Summary  Bruising and tenderness at the insertion site are common.  Follow your health care provider's instructions about caring for your insertion site. Change dressing and clean the area as instructed.  If your insertion site bleeds, apply direct pressure until bleeding stops.  Return to your normal activities as told by your health care provider. Ask what activities are safe.  Rest and drink plenty of fluids. This information is not intended to replace advice given to you by your health care provider. Make sure you discuss any questions you have with your health care provider. Document Revised: 10/10/2018 Document Reviewed: 10/10/2018 Elsevier Patient Education  2020 Elsevier Inc. Moderate Conscious Sedation, Adult Sedation is the use of medicines to promote relaxation and relieve discomfort and anxiety. Moderate conscious sedation is a type of sedation. Under moderate conscious sedation, you are less alert than normal, but you are still able to respond to instructions, touch, or both. Moderate conscious sedation is used during short medical and dental procedures. It is milder than deep sedation, which is a type of sedation under which you cannot be easily woken up. It is also  milder than general anesthesia, which is the use of medicines to make you unconscious. Moderate conscious sedation allows you to return to your regular activities sooner. Tell a health care provider about:  Any allergies you have.  All medicines you are taking, including vitamins, herbs, eye drops, creams, and over-the-counter medicines.  Use of steroids (by mouth or creams).  Any problems you or family members have had with   sedatives and anesthetic medicines.  Any blood disorders you have.  Any surgeries you have had.  Any medical conditions you have, such as sleep apnea.  Whether you are pregnant or may be pregnant.  Any use of cigarettes, alcohol, marijuana, or street drugs. What are the risks? Generally, this is a safe procedure. However, problems may occur, including:  Getting too much medicine (oversedation).  Nausea.  Allergic reaction to medicines.  Trouble breathing. If this happens, a breathing tube may be used to help with breathing. It will be removed when you are awake and breathing on your own.  Heart trouble.  Lung trouble. What happens before the procedure? Staying hydrated Follow instructions from your health care provider about hydration, which may include:  Up to 2 hours before the procedure - you may continue to drink clear liquids, such as water, clear fruit juice, black coffee, and plain tea. Eating and drinking restrictions Follow instructions from your health care provider about eating and drinking, which may include:  8 hours before the procedure - stop eating heavy meals or foods such as meat, fried foods, or fatty foods.  6 hours before the procedure - stop eating light meals or foods, such as toast or cereal.  6 hours before the procedure - stop drinking milk or drinks that contain milk.  2 hours before the procedure - stop drinking clear liquids. Medicine Ask your health care provider about:  Changing or stopping your regular medicines.  This is especially important if you are taking diabetes medicines or blood thinners.  Taking medicines such as aspirin and ibuprofen. These medicines can thin your blood. Do not take these medicines before your procedure if your health care provider instructs you not to.  Tests and exams  You will have a physical exam.  You may have blood tests done to show: ? How well your kidneys and liver are working. ? How well your blood can clot. General instructions  Plan to have someone take you home from the hospital or clinic.  If you will be going home right after the procedure, plan to have someone with you for 24 hours. What happens during the procedure?  An IV tube will be inserted into one of your veins.  Medicine to help you relax (sedative) will be given through the IV tube.  The medical or dental procedure will be performed. What happens after the procedure?  Your blood pressure, heart rate, breathing rate, and blood oxygen level will be monitored often until the medicines you were given have worn off.  Do not drive for 24 hours. This information is not intended to replace advice given to you by your health care provider. Make sure you discuss any questions you have with your health care provider. Document Revised: 03/04/2017 Document Reviewed: 07/12/2015 Elsevier Patient Education  2020 Elsevier Inc.  

## 2019-07-17 NOTE — Progress Notes (Addendum)
Discharge instructions reviewed with pt and his friend Fritz Pickerel (via telephone) both voice understanding. Pt wife also given update spoke with Dr Kathyrn Sheriff about when pt can restart Eliquis states ok to restart tomorrow pt informed.

## 2019-07-17 NOTE — Brief Op Note (Signed)
  NEUROSURGERY BRIEF OPERATIVE  NOTE   PREOP DX: Anterior communication artery aneurysm  POSTOP DX: Same  PROCEDURE: Diagnostic cerebral angiogram  SURGEON: Dr. Consuella Lose, MD  ANESTHESIA: IV Sedation with Local  EBL: Minimal  SPECIMENS: None  COMPLICATIONS: None  CONDITION: Stable to recovery  FINDINGS (Full report in [facility]): 1. Stable appearance of wide-based Acom aneurysm residual in comparison to prior angiogram of 2018.'

## 2019-07-26 ENCOUNTER — Telehealth: Payer: Self-pay | Admitting: Interventional Cardiology

## 2019-07-26 DIAGNOSIS — Z79899 Other long term (current) drug therapy: Secondary | ICD-10-CM

## 2019-07-26 DIAGNOSIS — I4891 Unspecified atrial fibrillation: Secondary | ICD-10-CM

## 2019-07-26 DIAGNOSIS — Z8679 Personal history of other diseases of the circulatory system: Secondary | ICD-10-CM

## 2019-07-26 NOTE — Telephone Encounter (Signed)
Pt c/o medication issue:  1. Name of Medication: dofetilide (TIKOSYN) 250 MCG capsule  2. How are you currently taking this medication (dosage and times per day)? 1 tablet 2 times a day  3. Are you having a reaction (difficulty breathing--STAT)? no  4. What is your medication issue? Patient states he would like to discuss stopping the medication and getting a heart monitor.

## 2019-07-26 NOTE — Telephone Encounter (Addendum)
Left message to call back. Advised pt there is a note in [facility] that Dr. Omelia Blackwater has a plan for stopped Tikosyn if pt decided to.  Advised to contact their office.

## 2019-07-27 ENCOUNTER — Telehealth: Payer: Self-pay | Admitting: Interventional Cardiology

## 2019-07-27 NOTE — Telephone Encounter (Signed)
Spoke with pt and he wanted to make sure Dr. Tamala Julian ordered the MRI because the call came from [place].  Advised pt he did order that and the MRI scheduler is at the [place] office and that's why the call came from there.  Pt appreciative for call.

## 2019-07-27 NOTE — Telephone Encounter (Signed)
Patient calling to speak with Anderson Malta, Dr. Thompson Caul nurse, in regards to an MRI that is scheduled for 08/06/19 at 12pm.

## 2019-07-27 NOTE — Telephone Encounter (Signed)
Pt called in today about his MRI that is scheduled.  I asked pt if he reached out to Dr. Beckey Downing office about stopping the Tikosyn or did he prefer I speak with Dr. Tamala Julian about it.  Pt would like to wait until after Cardiac MRI to stop Tikosyn.  MRI scheduled 5/3.  Pt also asked, if he stopped Tikosyn and had an issue, would he come to our office or have to go out to [place].  I mentioned our EP team to the pt.  Pt is interested in switching to one of our EP physicians to avoid having to go to [facility].  Advised I will send message to Dr. Tamala Julian to see about referral to our EP dept.  Pt appreciative for call.

## 2019-07-29 NOTE — Telephone Encounter (Signed)
Okay to consider either of our EP physicians, whoever can see earliest to get established.

## 2019-07-30 NOTE — Telephone Encounter (Signed)
Left detailed message (OK per DPR) letting pt know that Dr. Tamala Julian said he could see anyone on the EP team.  Advised I will place referral and EP scheduler will contact him to get him scheduled.

## 2019-08-03 ENCOUNTER — Telehealth (HOSPITAL_COMMUNITY): Payer: Self-pay | Admitting: Emergency Medicine

## 2019-08-03 NOTE — Telephone Encounter (Signed)
Reaching out to patient to offer assistance regarding upcoming cardiac imaging study; pt verbalizes understanding of appt date/time, parking situation and where to check in, and verified current allergies; name and call back number provided for further questions should they arise °Maximiano Lott RN Navigator Cardiac Imaging °[facility] °[phone] office °[phone] cell ° °Denies claustro, denies implants °

## 2019-08-06 ENCOUNTER — Ambulatory Visit (HOSPITAL_COMMUNITY)
Admission: RE | Admit: 2019-08-06 | Discharge: 2019-08-06 | Disposition: A | Discharge status: Home / Self Care | Payer: Medicare Other | Source: Ambulatory Visit | Attending: Interventional Cardiology | Admitting: Interventional Cardiology

## 2019-08-06 ENCOUNTER — Other Ambulatory Visit: Payer: Self-pay

## 2019-08-06 DIAGNOSIS — I2510 Atherosclerotic heart disease of native coronary artery without angina pectoris: Secondary | ICD-10-CM | POA: Insufficient documentation

## 2019-08-06 MED ORDER — GADOBUTROL 1 MMOL/ML IV SOLN
10.0000 mL | Freq: Once | INTRAVENOUS | Status: AC | PRN
  Administered 2019-08-06: 14:00:00 10 mL via INTRAVENOUS

## 2019-08-14 ENCOUNTER — Encounter: Payer: Self-pay | Admitting: Internal Medicine

## 2019-08-14 ENCOUNTER — Ambulatory Visit (INDEPENDENT_AMBULATORY_CARE_PROVIDER_SITE_OTHER): Payer: Medicare Other | Admitting: Internal Medicine

## 2019-08-14 ENCOUNTER — Other Ambulatory Visit: Payer: Self-pay

## 2019-08-14 VITALS — BP 172/100 | HR 72 | Ht 70.0 in | Wt 204.0 lb

## 2019-08-14 DIAGNOSIS — I480 Paroxysmal atrial fibrillation: Secondary | ICD-10-CM

## 2019-08-14 NOTE — Patient Instructions (Addendum)
Medication Instructions:  Your physician has recommended you make the following change in your medication:   1.  STOP dofetilide  Labwork: None ordered.  Testing/Procedures: None ordered.  Follow-Up: Your physician wants you to follow-up in: 6 months with Dr. Lovena Le.   You will receive a reminder letter in the mail two months in advance. If you don't receive a letter, please call our office to schedule the follow-up appointment.  Any Other Special Instructions Will Be Listed Below (If Applicable).  If you need a refill on your cardiac medications before your next appointment, please call your pharmacy.

## 2019-08-14 NOTE — Progress Notes (Signed)
HPI Randy Evans is referred today by Dr. Tamala Julian for evaluation of atrial fib/flutter, s/p at least 3 catheter ablations by Dr. [facility] Spurr and Omelia Blackwater. The patient has a h/o CAD, s/p MI with very minimal LV dysfunction by cardiac MRI. He also has a h/o hemochromatois. He has evidence of an old inferolateral MI and possible infiltration from hemochromatosis involving the anteroseptal base. He has been on dofetilide. He has not felt any atrial fib or flutter and he wonders about coming off of dofetilide.  Allergies  Allergen Reactions  . Codeine Itching  . Sulfa Antibiotics Other (See Comments)    Reactions: nausea, shaking of the body     Current Outpatient Medications  Medication Sig Dispense Refill  . acetaminophen (TYLENOL) 500 MG tablet Take 1,000 mg by mouth every 6 (six) hours as needed. pain     . allopurinol (ZYLOPRIM) 300 MG tablet Take 300 mg by mouth daily.      Marland Kitchen amLODipine (NORVASC) 5 MG tablet TAKE 1 TABLET BY MOUTH EVERY DAY 90 tablet 0  . apixaban (ELIQUIS) 5 MG TABS tablet Take 1 tablet (5 mg total) by mouth 2 (two) times daily. 180 tablet 1  . atorvastatin (LIPITOR) 10 MG tablet TAKE 1 TABLET BY MOUTH EVERY DAY 90 tablet 1  . chlorpheniramine (CHLOR-TRIMETON) 4 MG tablet Take 4 mg by mouth 2 (two) times daily as needed. allergies     . losartan (COZAAR) 100 MG tablet Take 1 tablet (100 mg total) by mouth daily. 90 tablet 2  . metoprolol succinate (TOPROL-XL) 50 MG 24 hr tablet TAKE 1 TABLET (50 MG TOTAL) BY MOUTH DAILY. TAKE WITH OR IMMEDIATELY FOLLOWING A MEAL. 90 tablet 1  . multivitamin-lutein (OCUVITE-LUTEIN) CAPS Take 1 capsule by mouth daily.       No current facility-administered medications for this visit.     Past Medical History:  Diagnosis Date  . Atrial fibrillation ([place])    Onset 1991 in [place]  . Cerebral aneurysm without rupture    treated with coil embolization 2006  . Gout   . Hemochromatosis    Has seen Teena Irani  . Hypertension      ROS:   All systems reviewed and negative except as noted in the HPI.   Past Surgical History:  Procedure Laterality Date  . ANEURYSM COILING  2006  . bone spur removal  1951   below left knee  . BRAIN SURGERY    . CARDIAC ELECTROPHYSIOLOGY MAPPING AND ABLATION  01/2008; 07/2008  . CARDIOVERSION  2000; 2008 03/2008; ;02/15/2011   2008; 2009 2012:    Surgeon: Kerry Hough., MD;  Location: [facility];  Service: Cardiovascular;  Laterality: N/A;  . CARDIOVERSION  08/05/2011   Procedure: CARDIOVERSION;  Surgeon: Jacolyn Reedy, MD;  Location: [facility];  Service: Cardiovascular;  Laterality: N/A;  . CATARACT EXTRACTION W/ INTRAOCULAR LENS  IMPLANT, BILATERAL  2009-2010  . IR ANGIO INTRA EXTRACRAN SEL COM CAROTID INNOMINATE UNI L MOD SED  11/09/2016  . IR ANGIO INTRA EXTRACRAN SEL COM CAROTID INNOMINATE UNI R MOD SED  07/17/2019  . IR ANGIO INTRA EXTRACRAN SEL INTERNAL CAROTID UNI R MOD SED  11/09/2016  . IR US GUIDE VASC ACCESS RIGHT  07/17/2019  . LIVER BIOPSY  late 1990's  . TOE SURGERY       History reviewed. No pertinent family history.   Social History   Socioeconomic History  . Marital status: Married    Spouse name: Not  on file  . Number of children: Not on file  . Years of education: Not on file  . Highest education level: Not on file  Occupational History  . Not on file  Tobacco Use  . Smoking status: Former Smoker    Years: 4.00    Types: Cigarettes, Pipe, Cigars  . Smokeless tobacco: Never Used  . Tobacco comment: quit smoking 1966  Substance and Sexual Activity  . Alcohol use: Yes    Alcohol/week: 2.0 standard drinks    Types: 2 Cans of beer per week  . Drug use: No  . Sexual activity: Yes  Other Topics Concern  . Not on file  Social History Narrative   Retired from [employer].  Moved from [place]. Graduate of [employer].  Married.   Social Determinants of Health   Financial Resource Strain:   . Difficulty of Paying Living Expenses:   Food Insecurity:   . Worried  About Charity fundraiser in the Last Year:   . Arboriculturist in the Last Year:   Transportation Needs:   . Film/video editor (Medical):   Marland Kitchen Lack of Transportation (Non-Medical):   Physical Activity:   . Days of Exercise per Week:   . Minutes of Exercise per Session:   Stress:   . Feeling of Stress :   Social Connections:   . Frequency of Communication with Friends and Family:   . Frequency of Social Gatherings with Friends and Family:   . Attends Religious Services:   . Active Member of Clubs or Organizations:   . Attends Archivist Meetings:   Marland Kitchen Marital Status:   Intimate Partner Violence:   . Fear of Current or Ex-Partner:   . Emotionally Abused:   Marland Kitchen Physically Abused:   . Sexually Abused:      BP (!) 172/100   Pulse 72   Ht 5\' 10"  (1.778 m)   Wt 204 lb (92.5 kg)   SpO2 95%   BMI 29.27 kg/m   Physical Exam:  Well appearing NAD HEENT: Unremarkable Neck:  No JVD, no thyromegally Lymphatics:  No adenopathy Back:  No CVA tenderness Lungs:  Clear HEART:  Regular rate rhythm, no murmurs, no rubs, no clicks Abd:  soft, positive bowel sounds, no organomegally, no rebound, no guarding Ext:  2 plus pulses, no edema, no cyanosis, no clubbing Skin:  No rashes no nodules Neuro:  CN II through XII intact, motor grossly intact  EKG - nsr with first degree AV block, RBBB and left axis    Assess/Plan: 1. PAF - he is maintaining NSR and would like to stop his dofetilide. He has had 4 ablations. I think it reasonable to come off of dofetilide but cautioned him that if he goes back out of rhythm and wants to start back on dofetilide, he will need to be in the hospital. 2. CAD - he denies anginal symptoms.  3. Weight gain - he has gained 20 lbs during Covid and is encouraged to lose weight. 4. Dyslipidemia - he will contiue his statin therapy.  Salome Spotted.

## 2019-08-15 DIAGNOSIS — H35373 Puckering of macula, bilateral: Secondary | ICD-10-CM | POA: Diagnosis not present

## 2019-08-15 DIAGNOSIS — H524 Presbyopia: Secondary | ICD-10-CM | POA: Diagnosis not present

## 2019-08-15 DIAGNOSIS — H353131 Nonexudative age-related macular degeneration, bilateral, early dry stage: Secondary | ICD-10-CM | POA: Diagnosis not present

## 2019-08-15 DIAGNOSIS — Z961 Presence of intraocular lens: Secondary | ICD-10-CM | POA: Diagnosis not present

## 2019-08-16 DIAGNOSIS — B078 Other viral warts: Secondary | ICD-10-CM | POA: Diagnosis not present

## 2019-09-04 ENCOUNTER — Other Ambulatory Visit: Payer: Self-pay | Admitting: Interventional Cardiology

## 2019-09-13 DIAGNOSIS — I10 Essential (primary) hypertension: Secondary | ICD-10-CM | POA: Diagnosis not present

## 2019-09-13 DIAGNOSIS — I671 Cerebral aneurysm, nonruptured: Secondary | ICD-10-CM | POA: Diagnosis not present

## 2019-09-13 DIAGNOSIS — Z6829 Body mass index (BMI) 29.0-29.9, adult: Secondary | ICD-10-CM | POA: Diagnosis not present

## 2019-09-16 ENCOUNTER — Other Ambulatory Visit: Payer: Self-pay | Admitting: Interventional Cardiology

## 2019-09-19 DIAGNOSIS — B078 Other viral warts: Secondary | ICD-10-CM | POA: Diagnosis not present

## 2019-09-24 ENCOUNTER — Other Ambulatory Visit: Payer: Self-pay | Admitting: Interventional Cardiology

## 2019-10-22 ENCOUNTER — Other Ambulatory Visit: Payer: Self-pay | Admitting: Interventional Cardiology

## 2019-10-22 NOTE — Telephone Encounter (Signed)
Pt last saw Dr Lovena Le 08/14/19, last labs 07/17/19 Creat 0.78, age 79, weight 92.5kg, based on specified criteria pt is on appropriate dosage of Eliquis 5mg  BID.  Will refill rx.

## 2019-10-30 DIAGNOSIS — B078 Other viral warts: Secondary | ICD-10-CM | POA: Diagnosis not present

## 2019-11-02 DIAGNOSIS — I10 Essential (primary) hypertension: Secondary | ICD-10-CM | POA: Diagnosis not present

## 2019-11-02 DIAGNOSIS — Z79899 Other long term (current) drug therapy: Secondary | ICD-10-CM | POA: Diagnosis not present

## 2019-11-02 DIAGNOSIS — I480 Paroxysmal atrial fibrillation: Secondary | ICD-10-CM | POA: Diagnosis not present

## 2019-11-02 DIAGNOSIS — E7800 Pure hypercholesterolemia, unspecified: Secondary | ICD-10-CM | POA: Diagnosis not present

## 2019-11-02 DIAGNOSIS — E1169 Type 2 diabetes mellitus with other specified complication: Secondary | ICD-10-CM | POA: Diagnosis not present

## 2019-11-05 ENCOUNTER — Telehealth: Payer: Self-pay | Admitting: Interventional Cardiology

## 2019-11-05 NOTE — Telephone Encounter (Signed)
New Message  Pt c/o medication issue:  1. Name of Medication: atorvastatin (LIPITOR) 10 MG tablet  2. How are you currently taking this medication (dosage and times per day)? As directed  3. Are you having a reaction (difficulty breathing--STAT)? No  4. What is your medication issue? PCP recommends dosage increase on medication from 10 mg to 20 mg. Please advise on medication dosage change.

## 2019-11-05 NOTE — Telephone Encounter (Signed)
Patient states that he saw his PCP last week who recommended that he increase his Lipitor to 20 mg daily. He states that blood sugars have bene elevated recently. He would like to know if Dr. Tamala Julian would increase his dose.

## 2019-11-06 ENCOUNTER — Other Ambulatory Visit: Payer: Self-pay | Admitting: Interventional Cardiology

## 2019-11-06 NOTE — Telephone Encounter (Signed)
Make sure he understands that the blood sugar has nothing to do with statin . If LDL > 70, it is reasonable to increase the dose.

## 2019-11-06 NOTE — Telephone Encounter (Signed)
Pt aware and states LDL is 45 Pt to discuss further with PCP .Adonis Housekeeper

## 2019-11-20 DIAGNOSIS — B078 Other viral warts: Secondary | ICD-10-CM | POA: Diagnosis not present

## 2019-12-20 DIAGNOSIS — B078 Other viral warts: Secondary | ICD-10-CM | POA: Diagnosis not present

## 2019-12-20 DIAGNOSIS — L820 Inflamed seborrheic keratosis: Secondary | ICD-10-CM | POA: Diagnosis not present

## 2019-12-25 DIAGNOSIS — Z23 Encounter for immunization: Secondary | ICD-10-CM | POA: Diagnosis not present

## 2019-12-26 NOTE — Progress Notes (Addendum)
Cardiology Office Note:    Date:  12/27/2019   ID:  Randy Evans, DOB February 16, 1941, MRN [id number]  PCP:  Lujean Amel, MD  Cardiologist:  Sinclair Grooms, MD   Referring MD: Lujean Amel, MD   Chief Complaint  Patient presents with  . Atrial Fibrillation  . Hypertension    History of Present Illness:    Randy Evans is a 79 y.o. male with a hx of paroxysmalatrial fibrillation,paroxysmal atypical atrial flutter and fibrillation (ablation x3), most recently 2013 at [facility] by Dr.Banhson. On dofetilide and wants to DC--> was DC'd after appointment with Dr. Lovena Le in May 2021. Other history includes hemochromatosis, hypertension, and chronic anticoagulation therapy.  He has no complaints.  There have not been any neurological or cardiovascular symptoms.  He denies chest pain.  There has been no bleeding on anticoagulation.  He has not had syncope or palpitations.  Dofetilide was discontinued and no recurrence of atrial fibrillation is occurring.  Primary care wanted to increase statin intensity but he refused.  Most recent lipid panel in February revealed an LDL of 45.  I explained to him that the recommendation may have been related to a metric to use high intensity statin therapy of those with coronary atherosclerosis.  Past Medical History:  Diagnosis Date  . Atrial fibrillation ([place])    Onset 1991 in [place]  . Cerebral aneurysm without rupture    treated with coil embolization 2006  . Gout   . Hemochromatosis    Has seen Teena Irani  . Hypertension     Past Surgical History:  Procedure Laterality Date  . ANEURYSM COILING  2006  . bone spur removal  1951   below left knee  . BRAIN SURGERY    . CARDIAC ELECTROPHYSIOLOGY MAPPING AND ABLATION  01/2008; 07/2008  . CARDIOVERSION  2000; 2008 03/2008; ;02/15/2011   2008; 2009 2012:    Surgeon: Kerry Hough., MD;  Location: [place];  Service: Cardiovascular;  Laterality: N/A;  .  CARDIOVERSION  08/05/2011   Procedure: CARDIOVERSION;  Surgeon: Jacolyn Reedy, MD;  Location: [place];  Service: Cardiovascular;  Laterality: N/A;  . CATARACT EXTRACTION W/ INTRAOCULAR LENS  IMPLANT, BILATERAL  2009-2010  . IR ANGIO INTRA EXTRACRAN SEL COM CAROTID INNOMINATE UNI L MOD SED  11/09/2016  . IR ANGIO INTRA EXTRACRAN SEL COM CAROTID INNOMINATE UNI R MOD SED  07/17/2019  . IR ANGIO INTRA EXTRACRAN SEL INTERNAL CAROTID UNI R MOD SED  11/09/2016  . IR US GUIDE VASC ACCESS RIGHT  07/17/2019  . LIVER BIOPSY  late 1990's  . TOE SURGERY      Current Medications: Current Meds  Medication Sig  . acetaminophen (TYLENOL) 500 MG tablet Take 1,000 mg by mouth every 6 (six) hours as needed. pain   . allopurinol (ZYLOPRIM) 300 MG tablet Take 300 mg by mouth daily.    Marland Kitchen amLODipine (NORVASC) 5 MG tablet TAKE 1 TABLET BY MOUTH EVERY DAY  . atorvastatin (LIPITOR) 10 MG tablet TAKE 1 TABLET BY MOUTH EVERY DAY  . chlorpheniramine (CHLOR-TRIMETON) 4 MG tablet Take 4 mg by mouth 2 (two) times daily as needed. allergies   . ELIQUIS 5 MG TABS tablet TAKE 1 TABLET TWICE A DAY  . losartan (COZAAR) 100 MG tablet TAKE 1 TABLET BY MOUTH EVERY DAY  . metoprolol succinate (TOPROL-XL) 50 MG 24 hr tablet TAKE 1 TABLET (50 MG TOTAL) BY MOUTH DAILY. TAKE WITH OR IMMEDIATELY FOLLOWING A MEAL.  Marland Kitchen  multivitamin-lutein (OCUVITE-LUTEIN) CAPS Take 1 capsule by mouth daily.       Allergies:   Codeine and Sulfa antibiotics   Social History   Socioeconomic History  . Marital status: Married    Spouse name: Not on file  . Number of children: Not on file  . Years of education: Not on file  . Highest education level: Not on file  Occupational History  . Not on file  Tobacco Use  . Smoking status: Former Smoker    Years: 4.00    Types: Cigarettes, Pipe, Cigars  . Smokeless tobacco: Never Used  . Tobacco comment: quit smoking 1966  Substance and Sexual Activity  . Alcohol use: Yes    Alcohol/week: 2.0 standard drinks     Types: 2 Cans of beer per week  . Drug use: No  . Sexual activity: Yes  Other Topics Concern  . Not on file  Social History Narrative   Retired from [employer].  Moved from [place]. Graduate of [employer].  Married.   Social Determinants of Health   Financial Resource Strain:   . Difficulty of Paying Living Expenses: Not on file  Food Insecurity:   . Worried About Charity fundraiser in the Last Year: Not on file  . Ran Out of Food in the Last Year: Not on file  Transportation Needs:   . Lack of Transportation (Medical): Not on file  . Lack of Transportation (Non-Medical): Not on file  Physical Activity:   . Days of Exercise per Week: Not on file  . Minutes of Exercise per Session: Not on file  Stress:   . Feeling of Stress : Not on file  Social Connections:   . Frequency of Communication with Friends and Family: Not on file  . Frequency of Social Gatherings with Friends and Family: Not on file  . Attends Religious Services: Not on file  . Active Member of Clubs or Organizations: Not on file  . Attends Archivist Meetings: Not on file  . Marital Status: Not on file     Family History: The patient's family history is not on file.  ROS:   Please see the history of present illness.    Does not measure blood pressure at home.  Has a lifelong history of hypertension.  Has gout and therefore diuretics have been avoided.  All other systems reviewed and are negative.  EKGs/Labs/Other Studies Reviewed:    The following studies were reviewed today: No new imaging or functional data.  EKG:  EKG performed on Aug 15, 2019, demonstrates PR interval 202 ms, left anterior hemiblock, and right bundle branch block.  Recent Labs: 07/17/2019: BUN 14; Creatinine, Ser 0.78; Hemoglobin 16.8; Platelets 173; Potassium 4.0; Sodium 137  Recent Lipid Panel No results found for: CHOL, TRIG, HDL, CHOLHDL, VLDL, LDLCALC, LDLDIRECT  Physical Exam:    VS:  BP (!) 148/82   Pulse 67   Ht 5'  10" (1.778 m)   Wt 200 lb (90.7 kg)   SpO2 98%   BMI 28.70 kg/m     Wt Readings from Last 3 Encounters:  12/27/19 200 lb (90.7 kg)  08/14/19 204 lb (92.5 kg)  07/17/19 198 lb (89.8 kg)     GEN: Stable but overweight. No acute distress HEENT: Normal NECK: No JVD. LYMPHATICS: No lymphadenopathy CARDIAC:  RRR without murmur, gallop, or edema. VASCULAR:  Normal Pulses. No bruits. RESPIRATORY:  Clear to auscultation without rales, wheezing or rhonchi  ABDOMEN: Soft, non-tender, non-distended, No  pulsatile mass, MUSCULOSKELETAL: No deformity  SKIN: Warm and dry NEUROLOGIC:  Alert and oriented x 3 PSYCHIATRIC:  Normal affect   ASSESSMENT:    1. Paroxysmal atrial fibrillation (HCC)   2. On dofetilide therapy   3. S/P ablation of atrial fibrillation   4. Long term current use of anticoagulant therapy   5. Essential hypertension   6. Hemochromatosis, unspecified hemochromatosis type   7. CAD in native artery   8. Educated about COVID-19 virus infection   9. Hyperlipidemia with target LDL less than 70   10. Trifascicular block    PLAN:    In order of problems listed above:  1. Currently in rhythm based upon clinical exam.  He is off dofetilide.  Continue to monitor for recurrence of atrial fib.  We will see Dr. Lovena Le next in December 2021. 2. Medication has been discontinued. 3. Status post multiple ablations and now on no therapy for atrial fibrillation. 4. Continue Eliquis.  Better blood pressure control is required. 5. Target 130/80 mmHg.  Continue Toprol-XL 50 mg daily, Cozaar 100 mg daily, Norvasc 5 mg daily.  He will measure blood pressures at home and let us know if he has consistent readings above 130/80 mmHg.  If so, further up titration of therapy will be entertained which could include increasing Norvasc or adding low-dose HCTZ to losartan.  Note he has had a prior history of gout. 6. We did not discuss hemochromatosis 7. He has prior history of atherosclerosis,  nonobstructive. 8. He is cognizant of COVID-19, has been vaccinated, and is practicing mitigation. 9. Most recent LDL was 45 on low intensity statin therapy.  I have not recommended further increase in statin intensity. 10. RBBB, 1 degree AVB, and RBBB.   Medication Adjustments/Labs and Tests Ordered: Current medicines are reviewed at length with the patient today.  Concerns regarding medicines are outlined above.  No orders of the defined types were placed in this encounter.  No orders of the defined types were placed in this encounter.   Patient Instructions  Medication Instructions:  Your physician recommends that you continue on your current medications as directed. Please refer to the Current Medication list given to you today.  *If you need a refill on your cardiac medications before your next appointment, please call your pharmacy*   Lab Work: None If you have labs (blood work) drawn today and your tests are completely normal, you will receive your results only by: Marland Kitchen MyChart Message (if you have MyChart) OR . A paper copy in the mail If you have any lab test that is abnormal or we need to change your treatment, we will call you to review the results.   Testing/Procedures: None   Follow-Up: At [facility], you and your health needs are our priority.  As part of our continuing mission to provide you with exceptional heart care, we have created designated Provider Care Teams.  These Care Teams include your primary Cardiologist (physician) and Advanced Practice Providers (APPs -  Physician Assistants and Nurse Practitioners) who all work together to provide you with the care you need, when you need it.  We recommend signing up for the patient portal called "MyChart".  Sign up information is provided on this After Visit Summary.  MyChart is used to connect with patients for Virtual Visits (Telemedicine).  Patients are able to view lab/test results, encounter notes, upcoming  appointments, etc.  Non-urgent messages can be sent to your provider as well.   To learn more  about what you can do with MyChart, go to [email or web address].    Your next appointment:   12 month(s)  The format for your next appointment:   In Person  Provider:   You may see Sinclair Grooms, MD or one of the following Advanced Practice Providers on your designated Care Team:    Truitt Merle, NP  Cecilie Kicks, NP  Kathyrn Drown, NP    Other Instructions  Monitor your blood pressure a couple of times a week.  Make sure it is at least 2 hours after medications.  Your goal blood pressure is 130/80 or lower.  Call or send a MyChart message with those readings after monitoring for a month or two.      Signed, Sinclair Grooms, MD  12/27/2019 9:30 AM    [place]

## 2019-12-27 ENCOUNTER — Ambulatory Visit (INDEPENDENT_AMBULATORY_CARE_PROVIDER_SITE_OTHER): Payer: Medicare Other | Admitting: Interventional Cardiology

## 2019-12-27 ENCOUNTER — Encounter: Payer: Self-pay | Admitting: Interventional Cardiology

## 2019-12-27 ENCOUNTER — Other Ambulatory Visit: Payer: Self-pay

## 2019-12-27 VITALS — BP 148/82 | HR 67 | Ht 70.0 in | Wt 200.0 lb

## 2019-12-27 DIAGNOSIS — Z7901 Long term (current) use of anticoagulants: Secondary | ICD-10-CM | POA: Diagnosis not present

## 2019-12-27 DIAGNOSIS — E785 Hyperlipidemia, unspecified: Secondary | ICD-10-CM

## 2019-12-27 DIAGNOSIS — I480 Paroxysmal atrial fibrillation: Secondary | ICD-10-CM | POA: Diagnosis not present

## 2019-12-27 DIAGNOSIS — Z9889 Other specified postprocedural states: Secondary | ICD-10-CM

## 2019-12-27 DIAGNOSIS — I453 Trifascicular block: Secondary | ICD-10-CM

## 2019-12-27 DIAGNOSIS — Z79899 Other long term (current) drug therapy: Secondary | ICD-10-CM | POA: Diagnosis not present

## 2019-12-27 DIAGNOSIS — Z7189 Other specified counseling: Secondary | ICD-10-CM

## 2019-12-27 DIAGNOSIS — Z8679 Personal history of other diseases of the circulatory system: Secondary | ICD-10-CM

## 2019-12-27 DIAGNOSIS — I10 Essential (primary) hypertension: Secondary | ICD-10-CM | POA: Diagnosis not present

## 2019-12-27 DIAGNOSIS — I2510 Atherosclerotic heart disease of native coronary artery without angina pectoris: Secondary | ICD-10-CM

## 2019-12-27 NOTE — Patient Instructions (Signed)
Medication Instructions:  Your physician recommends that you continue on your current medications as directed. Please refer to the Current Medication list given to you today.  *If you need a refill on your cardiac medications before your next appointment, please call your pharmacy*   Lab Work: None If you have labs (blood work) drawn today and your tests are completely normal, you will receive your results only by:  [place] (if you have MyChart) OR  A paper copy in the mail If you have any lab test that is abnormal or we need to change your treatment, we will call you to review the results.   Testing/Procedures: None   Follow-Up: At [facility], you and your health needs are our priority.  As part of our continuing mission to provide you with exceptional heart care, we have created designated Provider Care Teams.  These Care Teams include your primary Cardiologist (physician) and Advanced Practice Providers (APPs -  Physician Assistants and Nurse Practitioners) who all work together to provide you with the care you need, when you need it.  We recommend signing up for the patient portal called "MyChart".  Sign up information is provided on this After Visit Summary.  MyChart is used to connect with patients for Virtual Visits (Telemedicine).  Patients are able to view lab/test results, encounter notes, upcoming appointments, etc.  Non-urgent messages can be sent to your provider as well.   To learn more about what you can do with MyChart, go to [email or web address].    Your next appointment:   12 month(s)  The format for your next appointment:   In Person  Provider:   You may see Sinclair Grooms, MD or one of the following Advanced Practice Providers on your designated Care Team:    Truitt Merle, NP  Cecilie Kicks, NP  Kathyrn Drown, NP    Other Instructions  Monitor your blood pressure a couple of times a week.  Make sure it is at least 2 hours after  medications.  Your goal blood pressure is 130/80 or lower.  Call or send a MyChart message with those readings after monitoring for a month or two.

## 2019-12-31 DIAGNOSIS — Z23 Encounter for immunization: Secondary | ICD-10-CM | POA: Diagnosis not present

## 2020-01-22 DIAGNOSIS — B078 Other viral warts: Secondary | ICD-10-CM | POA: Diagnosis not present

## 2020-02-19 DIAGNOSIS — B078 Other viral warts: Secondary | ICD-10-CM | POA: Diagnosis not present

## 2020-02-19 DIAGNOSIS — L570 Actinic keratosis: Secondary | ICD-10-CM | POA: Diagnosis not present

## 2020-03-05 ENCOUNTER — Other Ambulatory Visit: Payer: Self-pay

## 2020-03-05 ENCOUNTER — Ambulatory Visit (INDEPENDENT_AMBULATORY_CARE_PROVIDER_SITE_OTHER): Payer: Medicare Other | Admitting: Internal Medicine

## 2020-03-05 ENCOUNTER — Encounter: Payer: Self-pay | Admitting: Internal Medicine

## 2020-03-05 VITALS — BP 162/92 | HR 66 | Ht 70.0 in | Wt 198.2 lb

## 2020-03-05 DIAGNOSIS — I480 Paroxysmal atrial fibrillation: Secondary | ICD-10-CM

## 2020-03-05 DIAGNOSIS — I2510 Atherosclerotic heart disease of native coronary artery without angina pectoris: Secondary | ICD-10-CM

## 2020-03-05 NOTE — Patient Instructions (Signed)

## 2020-03-05 NOTE — Progress Notes (Signed)
HPI Randy Evans returns for ongoing evaluation and management of atrial fib. He is a pleasant 79 yo man with HTN, dyslipidemia, and PAF, s/p multiple ablations. He stopped his dofetilide over a year ago. He has had some break throughs of atrial fib but these typically only last an hour or so. No syncope. He notes that his bp is better at home though he admits to not always taking it. Allergies  Allergen Reactions  . Codeine Itching  . Sulfa Antibiotics Other (See Comments)    Reactions: nausea, shaking of the body     Current Outpatient Medications  Medication Sig Dispense Refill  . acetaminophen (TYLENOL) 500 MG tablet Take 1,000 mg by mouth every 6 (six) hours as needed. pain     . allopurinol (ZYLOPRIM) 300 MG tablet Take 300 mg by mouth daily.      Marland Kitchen amLODipine (NORVASC) 5 MG tablet TAKE 1 TABLET BY MOUTH EVERY DAY 90 tablet 2  . atorvastatin (LIPITOR) 10 MG tablet TAKE 1 TABLET BY MOUTH EVERY DAY 90 tablet 1  . chlorpheniramine (CHLOR-TRIMETON) 4 MG tablet Take 4 mg by mouth 2 (two) times daily as needed. allergies     . ELIQUIS 5 MG TABS tablet TAKE 1 TABLET TWICE A DAY 180 tablet 1  . losartan (COZAAR) 100 MG tablet TAKE 1 TABLET BY MOUTH EVERY DAY 90 tablet 2  . metoprolol succinate (TOPROL-XL) 50 MG 24 hr tablet TAKE 1 TABLET (50 MG TOTAL) BY MOUTH DAILY. TAKE WITH OR IMMEDIATELY FOLLOWING A MEAL. 90 tablet 3  . multivitamin-lutein (OCUVITE-LUTEIN) CAPS Take 1 capsule by mouth daily.       No current facility-administered medications for this visit.     Past Medical History:  Diagnosis Date  . Atrial fibrillation ([place])    Onset 1991 in [place]  . Cerebral aneurysm without rupture    treated with coil embolization 2006  . Gout   . Hemochromatosis    Has seen Teena Irani  . Hypertension     ROS:   All systems reviewed and negative except as noted in the HPI.   Past Surgical History:  Procedure Laterality Date  . ANEURYSM COILING  2006  . bone spur removal   1951   below left knee  . BRAIN SURGERY    . CARDIAC ELECTROPHYSIOLOGY MAPPING AND ABLATION  01/2008; 07/2008  . CARDIOVERSION  2000; 2008 03/2008; ;02/15/2011   2008; 2009 2012:    Surgeon: Kerry Hough., MD;  Location: [place];  Service: Cardiovascular;  Laterality: N/A;  . CARDIOVERSION  08/05/2011   Procedure: CARDIOVERSION;  Surgeon: Jacolyn Reedy, MD;  Location: [place];  Service: Cardiovascular;  Laterality: N/A;  . CATARACT EXTRACTION W/ INTRAOCULAR LENS  IMPLANT, BILATERAL  2009-2010  . IR ANGIO INTRA EXTRACRAN SEL COM CAROTID INNOMINATE UNI L MOD SED  11/09/2016  . IR ANGIO INTRA EXTRACRAN SEL COM CAROTID INNOMINATE UNI R MOD SED  07/17/2019  . IR ANGIO INTRA EXTRACRAN SEL INTERNAL CAROTID UNI R MOD SED  11/09/2016  . IR US GUIDE VASC ACCESS RIGHT  07/17/2019  . LIVER BIOPSY  late 1990's  . TOE SURGERY       History reviewed. No pertinent family history.   Social History   Socioeconomic History  . Marital status: Married    Spouse name: Not on file  . Number of children: Not on file  . Years of education: Not on file  . Highest education level: Not  on file  Occupational History  . Not on file  Tobacco Use  . Smoking status: Former Smoker    Years: 4.00    Types: Cigarettes, Pipe, Cigars  . Smokeless tobacco: Never Used  . Tobacco comment: quit smoking 1966  Substance and Sexual Activity  . Alcohol use: Yes    Alcohol/week: 2.0 standard drinks    Types: 2 Cans of beer per week  . Drug use: No  . Sexual activity: Yes  Other Topics Concern  . Not on file  Social History Narrative   Retired from [employer].  Moved from [place]. Graduate of [employer].  Married.   Social Determinants of Health   Financial Resource Strain:   . Difficulty of Paying Living Expenses: Not on file  Food Insecurity:   . Worried About Charity fundraiser in the Last Year: Not on file  . Ran Out of Food in the Last Year: Not on file  Transportation Needs:   . Lack of Transportation (Medical):  Not on file  . Lack of Transportation (Non-Medical): Not on file  Physical Activity:   . Days of Exercise per Week: Not on file  . Minutes of Exercise per Session: Not on file  Stress:   . Feeling of Stress : Not on file  Social Connections:   . Frequency of Communication with Friends and Family: Not on file  . Frequency of Social Gatherings with Friends and Family: Not on file  . Attends Religious Services: Not on file  . Active Member of Clubs or Organizations: Not on file  . Attends Archivist Meetings: Not on file  . Marital Status: Not on file  Intimate Partner Violence:   . Fear of Current or Ex-Partner: Not on file  . Emotionally Abused: Not on file  . Physically Abused: Not on file  . Sexually Abused: Not on file     BP (!) 162/92   Pulse 66   Ht 5\' 10"  (1.778 m)   Wt 198 lb 3.2 oz (89.9 kg)   SpO2 92%   BMI 28.44 kg/m   Physical Exam:  Well appearing 79 yo man, NAD HEENT: Unremarkable Neck:  6 cm JVD, no thyromegally Lymphatics:  No adenopathy Back:  No CVA tenderness Lungs:  Clear with no wheezes HEART:  Regular rate rhythm, no murmurs, no rubs, no clicks Abd:  soft, positive bowel sounds, no organomegally, no rebound, no guarding Ext:  2 plus pulses, no edema, no cyanosis, no clubbing Skin:  No rashes no nodules Neuro:  CN II through XII intact, motor grossly intact  EKG - NSR with RBBB   Assess/Plan: 1. PAF - he is mostly maintaining NSR. I recommended he continue his anticoagulation and beta blocker.  2. CAD - he denies anginal symptoms. 3. dylipidemia - he will continue lipitor. 4. Weight gain - he is down 6 lbs from his last visit and I encouraged him to try and lose 15 more.  Carleene Overlie Matalie Romberger,MD

## 2020-03-19 DIAGNOSIS — B078 Other viral warts: Secondary | ICD-10-CM | POA: Diagnosis not present

## 2020-04-07 ENCOUNTER — Other Ambulatory Visit: Payer: Self-pay | Admitting: Interventional Cardiology

## 2020-04-08 ENCOUNTER — Other Ambulatory Visit: Payer: Self-pay | Admitting: Internal Medicine

## 2020-04-08 NOTE — Telephone Encounter (Signed)
Prescription refill request for Eliquis received.  Indication: afib Last office visit: taylor, 03/05/2020 Scr: 0.78, 07/17/2019 Age: 80 yo Weight: 89.9 kg   Prescription refill sent.

## 2020-05-06 DIAGNOSIS — E1169 Type 2 diabetes mellitus with other specified complication: Secondary | ICD-10-CM | POA: Diagnosis not present

## 2020-05-06 DIAGNOSIS — R945 Abnormal results of liver function studies: Secondary | ICD-10-CM | POA: Diagnosis not present

## 2020-05-06 DIAGNOSIS — E7800 Pure hypercholesterolemia, unspecified: Secondary | ICD-10-CM | POA: Diagnosis not present

## 2020-05-06 DIAGNOSIS — M109 Gout, unspecified: Secondary | ICD-10-CM | POA: Diagnosis not present

## 2020-05-08 DIAGNOSIS — E7800 Pure hypercholesterolemia, unspecified: Secondary | ICD-10-CM | POA: Diagnosis not present

## 2020-05-08 DIAGNOSIS — I671 Cerebral aneurysm, nonruptured: Secondary | ICD-10-CM | POA: Diagnosis not present

## 2020-05-08 DIAGNOSIS — E1169 Type 2 diabetes mellitus with other specified complication: Secondary | ICD-10-CM | POA: Diagnosis not present

## 2020-05-08 DIAGNOSIS — Z0001 Encounter for general adult medical examination with abnormal findings: Secondary | ICD-10-CM | POA: Diagnosis not present

## 2020-05-08 DIAGNOSIS — I480 Paroxysmal atrial fibrillation: Secondary | ICD-10-CM | POA: Diagnosis not present

## 2020-05-08 DIAGNOSIS — M109 Gout, unspecified: Secondary | ICD-10-CM | POA: Diagnosis not present

## 2020-05-08 DIAGNOSIS — I10 Essential (primary) hypertension: Secondary | ICD-10-CM | POA: Diagnosis not present

## 2020-05-19 ENCOUNTER — Other Ambulatory Visit (HOSPITAL_COMMUNITY): Payer: Self-pay | Admitting: *Deleted

## 2020-05-20 ENCOUNTER — Ambulatory Visit (HOSPITAL_COMMUNITY)
Admission: RE | Admit: 2020-05-20 | Discharge: 2020-05-20 | Disposition: A | Discharge status: Home / Self Care | Payer: Medicare Other | Source: Ambulatory Visit | Attending: Gastroenterology | Admitting: Gastroenterology

## 2020-05-20 LAB — POCT HEMOGLOBIN-HEMACUE: Hemoglobin: 14.9 g/dL (ref 13.0–17.0)

## 2020-05-20 MED ORDER — LIDOCAINE HCL (PF) 1 % IJ SOLN: 2.0000 mL | Freq: Once | INTRAMUSCULAR | Status: DC

## 2020-05-20 NOTE — Progress Notes (Signed)
Pt here for therapeutic phlebotomy.  HGB 14.9 via hemocue.  Approx 500cc removed via left antecub vein. Pt tolerated procedure well

## 2020-05-27 DIAGNOSIS — D229 Melanocytic nevi, unspecified: Secondary | ICD-10-CM | POA: Diagnosis not present

## 2020-05-27 DIAGNOSIS — L814 Other melanin hyperpigmentation: Secondary | ICD-10-CM | POA: Diagnosis not present

## 2020-05-27 DIAGNOSIS — L718 Other rosacea: Secondary | ICD-10-CM | POA: Diagnosis not present

## 2020-05-27 DIAGNOSIS — L821 Other seborrheic keratosis: Secondary | ICD-10-CM | POA: Diagnosis not present

## 2020-05-27 DIAGNOSIS — R233 Spontaneous ecchymoses: Secondary | ICD-10-CM | POA: Diagnosis not present

## 2020-05-27 DIAGNOSIS — B078 Other viral warts: Secondary | ICD-10-CM | POA: Diagnosis not present

## 2020-05-27 DIAGNOSIS — L738 Other specified follicular disorders: Secondary | ICD-10-CM | POA: Diagnosis not present

## 2020-06-24 DIAGNOSIS — B078 Other viral warts: Secondary | ICD-10-CM | POA: Diagnosis not present

## 2020-06-24 DIAGNOSIS — L810 Postinflammatory hyperpigmentation: Secondary | ICD-10-CM | POA: Diagnosis not present

## 2020-07-10 DIAGNOSIS — Z23 Encounter for immunization: Secondary | ICD-10-CM | POA: Diagnosis not present

## 2020-07-15 DIAGNOSIS — H353131 Nonexudative age-related macular degeneration, bilateral, early dry stage: Secondary | ICD-10-CM | POA: Diagnosis not present

## 2020-07-15 DIAGNOSIS — Z961 Presence of intraocular lens: Secondary | ICD-10-CM | POA: Diagnosis not present

## 2020-07-15 DIAGNOSIS — H524 Presbyopia: Secondary | ICD-10-CM | POA: Diagnosis not present

## 2020-07-15 DIAGNOSIS — H35373 Puckering of macula, bilateral: Secondary | ICD-10-CM | POA: Diagnosis not present

## 2020-07-17 ENCOUNTER — Other Ambulatory Visit: Payer: Self-pay | Admitting: Interventional Cardiology

## 2020-07-23 ENCOUNTER — Other Ambulatory Visit: Payer: Self-pay | Admitting: Interventional Cardiology

## 2020-08-20 DIAGNOSIS — L308 Other specified dermatitis: Secondary | ICD-10-CM | POA: Diagnosis not present

## 2020-09-07 ENCOUNTER — Other Ambulatory Visit: Payer: Self-pay | Admitting: Interventional Cardiology

## 2020-09-23 ENCOUNTER — Other Ambulatory Visit: Payer: Self-pay

## 2020-09-23 ENCOUNTER — Encounter: Payer: Self-pay | Admitting: Family

## 2020-09-23 ENCOUNTER — Ambulatory Visit (INDEPENDENT_AMBULATORY_CARE_PROVIDER_SITE_OTHER): Payer: Medicare Other | Admitting: Family

## 2020-09-23 VITALS — BP 134/80 | HR 60 | Ht 70.0 in | Wt 198.0 lb

## 2020-09-23 DIAGNOSIS — Z9889 Other specified postprocedural states: Secondary | ICD-10-CM

## 2020-09-23 DIAGNOSIS — I2510 Atherosclerotic heart disease of native coronary artery without angina pectoris: Secondary | ICD-10-CM | POA: Diagnosis not present

## 2020-09-23 DIAGNOSIS — I480 Paroxysmal atrial fibrillation: Secondary | ICD-10-CM | POA: Diagnosis not present

## 2020-09-23 DIAGNOSIS — E785 Hyperlipidemia, unspecified: Secondary | ICD-10-CM

## 2020-09-23 DIAGNOSIS — I10 Essential (primary) hypertension: Secondary | ICD-10-CM | POA: Diagnosis not present

## 2020-09-23 DIAGNOSIS — Z8679 Personal history of other diseases of the circulatory system: Secondary | ICD-10-CM | POA: Diagnosis not present

## 2020-09-23 DIAGNOSIS — Z7901 Long term (current) use of anticoagulants: Secondary | ICD-10-CM | POA: Diagnosis not present

## 2020-09-23 NOTE — Patient Instructions (Signed)
Medication Instructions:  Continue your current medications.   *If you need a refill on your cardiac medications before your next appointment, please call your pharmacy*  Lab Work: None today.  Testing/Procedures: None ordered today.   Follow-Up: At [facility], you and your health needs are our priority.  As part of our continuing mission to provide you with exceptional heart care, we have created designated Provider Care Teams.  These Care Teams include your primary Cardiologist (physician) and Advanced Practice Providers (APPs -  Physician Assistants and Nurse Practitioners) who all work together to provide you with the care you need, when you need it.  We recommend signing up for the patient portal called "MyChart".  Sign up information is provided on this After Visit Summary.  MyChart is used to connect with patients for Virtual Visits (Telemedicine).  Patients are able to view lab/test results, encounter notes, upcoming appointments, etc.  Non-urgent messages can be sent to your provider as well.   To learn more about what you can do with MyChart, go to [email or web address].    Your next appointment:   December 2022 with Dr. Lovena Le AND June 2023 with Dr. Tamala Julian  Other Instructions  Heart Healthy Diet Recommendations: A low-salt diet is recommended. Meats should be grilled, baked, or boiled. Avoid fried foods. Focus on lean protein sources like fish or chicken with vegetables and fruits. The American Heart Association is a Microbiologist!  American Heart Association Diet and Lifeystyle Recommendations   Exercise recommendations: The American Heart Association recommends 150 minutes of moderate intensity exercise weekly. Try 30 minutes of moderate intensity exercise 4-5 times per week. This could include walking, jogging, or swimming.

## 2020-09-23 NOTE — Progress Notes (Signed)
Office Visit    Patient Name: Randy Evans Date of Encounter: 09/23/2020  PCP:  Lujean Amel, [place]  Cardiologist:  Sinclair Grooms, MD  Advanced Practice Provider:  No care team member to display Electrophysiologist:  Cristopher Peru, MD    Chief Complaint    Randy Evans is a 80 y.o. male with a hx of paroxysmal atrial fibrillation, paroxysmal atypical atrial flutter and fibrillation since his ablation X3), most recent in 2013 and [facility], hemochromatosis, hypertension, chronic anticoagulation, hyperlipidemia, nonobstructive coronary artery disease, RBBB, first-degree AV block presents today for follow-up of atrial fibrillation, coronary artery disease.  Past Medical History    Past Medical History:  Diagnosis Date   Atrial fibrillation ([place])    Onset 1991 in [place]   Cerebral aneurysm without rupture    treated with coil embolization 2006   Gout    Hemochromatosis    Has seen Teena Irani   Hypertension    Past Surgical History:  Procedure Laterality Date   ANEURYSM COILING  2006   bone spur removal  1951   below left knee   [place] AND ABLATION  01/2008; 07/2008   CARDIOVERSION  2000; 2008 03/2008; ;02/15/2011   2008; 2009 2012:    Surgeon: Kerry Hough., MD;  Location: [facility];  Service: Cardiovascular;  Laterality: N/A;   CARDIOVERSION  08/05/2011   Procedure: CARDIOVERSION;  Surgeon: Jacolyn Reedy, MD;  Location: [facility];  Service: Cardiovascular;  Laterality: N/A;   CATARACT EXTRACTION W/ INTRAOCULAR LENS  IMPLANT, BILATERAL  2009-2010   IR ANGIO INTRA EXTRACRAN SEL COM CAROTID INNOMINATE UNI L MOD SED  11/09/2016   IR ANGIO INTRA EXTRACRAN SEL COM CAROTID INNOMINATE UNI R MOD SED  07/17/2019   IR ANGIO INTRA EXTRACRAN SEL INTERNAL CAROTID UNI R MOD SED  11/09/2016   IR US GUIDE VASC ACCESS RIGHT  07/17/2019   LIVER BIOPSY  late 1990's   TOE SURGERY       Allergies  Allergies  Allergen Reactions   Codeine Itching   Sulfa Antibiotics Other (See Comments)    Reactions: nausea, shaking of the body    History of Present Illness    Randy Evans is a 80 y.o. male with a hx of paroxysmal atrial fibrillation, paroxysmal atypical atrial flutter and fibrillation since his ablation X3), most recent in 2013 and [facility], hemochromatosis, hypertension, chronic anticoagulation, hyperlipidemia, nonobstructive coronary artery disease, RBBB, first-degree AV block last seen 03/05/2020 by Dr. Lovena Le.  He was previously on dofetilide but self discontinued and at last clinic visit 03/05/2020 Dr. Lovena Le was noted to be maintaining normal sinus rhythm.  EKGs/Labs/Other Studies Reviewed:   The following studies were reviewed today:  Echo 04/2018 Left ventricle: The cavity size was normal. There was moderate    concentric hypertrophy. Systolic function was normal. The    estimated ejection fraction was in the range of 50% to 55%. Wall    motion was normal; there were no regional wall motion    abnormalities. Features are consistent with a pseudonormal left    ventricular filling pattern, with concomitant abnormal relaxation    and increased filling pressure (grade 2 diastolic dysfunction).    Doppler parameters are consistent with high ventricular filling    pressure.  - Aortic valve: Transvalvular velocity was within the normal range.    There was no  stenosis. There was no regurgitation.  - Aorta: Ascending aortic diameter: 43 mm (S).  - Ascending aorta: The ascending aorta was mildly dilated.  - Mitral valve: Transvalvular velocity was within the normal range.    There was no evidence for stenosis. There was trivial    regurgitation.  - Left atrium: The atrium was mildly dilated.  - Right ventricle: The cavity size was normal. Wall thickness was    normal. Systolic function was normal.   EKG:  No EKG today.  Recent  Labs: 05/20/2020: Hemoglobin 14.9  Recent Lipid Panel No results found for: CHOL, TRIG, HDL, CHOLHDL, VLDL, LDLCALC, LDLDIRECT  Risk Assessment/Calculations:   CHA2DS2-VASc Score = 4  This indicates a 4.8% annual risk of stroke. The patient's score is based upon: CHF History: No HTN History: Yes Diabetes History: No Stroke History: No Vascular Disease History: Yes Age Score: 2 Gender Score: 0   Home Medications   Current Meds  Medication Sig   acetaminophen (TYLENOL) 500 MG tablet Take 1,000 mg by mouth every 6 (six) hours as needed. pain    allopurinol (ZYLOPRIM) 300 MG tablet Take 300 mg by mouth daily.     amLODipine (NORVASC) 5 MG tablet TAKE 1 TABLET BY MOUTH EVERY DAY   atorvastatin (LIPITOR) 10 MG tablet TAKE 1 TABLET BY MOUTH EVERY DAY   chlorpheniramine (CHLOR-TRIMETON) 4 MG tablet Take 4 mg by mouth 2 (two) times daily as needed. allergies    ELIQUIS 5 MG TABS tablet TAKE 1 TABLET TWICE A DAY   metoprolol succinate (TOPROL-XL) 50 MG 24 hr tablet TAKE 1 TABLET (50 MG TOTAL) BY MOUTH DAILY. TAKE WITH OR IMMEDIATELY FOLLOWING A MEAL.   multivitamin-lutein (OCUVITE-LUTEIN) CAPS Take 1 capsule by mouth daily.     valsartan (DIOVAN) 320 MG tablet Take 320 mg by mouth daily.     Review of Systems   All other systems reviewed and are otherwise negative except as noted above.  Physical Exam    VS:  BP 134/80 (BP Location: Left Arm, Patient Position: Sitting, Cuff Size: Normal)   Pulse 60   Ht 5\' 10"  (1.778 m)   Wt 198 lb (89.8 kg)   SpO2 98%   BMI 28.41 kg/m  , BMI Body mass index is 28.41 kg/m.  Wt Readings from Last 3 Encounters:  09/23/20 198 lb (89.8 kg)  03/05/20 198 lb 3.2 oz (89.9 kg)  12/27/19 200 lb (90.7 kg)     GEN: Well nourished, well developed, in no acute distress. HEENT: normal. Neck: Supple, no JVD, carotid bruits, or masses. Cardiac: RRR, no murmurs, rubs, or gallops. No clubbing, cyanosis, edema.  Radials//PT 2+ and equal bilaterally.   Respiratory:  Respirations regular and unlabored, clear to auscultation bilaterally. GI: Soft, nontender, nondistended. MS: No deformity or atrophy. Skin: Warm and dry, no rash. Neuro:  Strength and sensation are intact. Psych: Normal affect.  Assessment & Plan    PAF/chronic anticoagulation-reports no palpitations.  Maintaining sinus rhythm by auscultation today.  Continue present metoprolol dose.  Anticoagulated with Eliquis 5 mg twice daily.  Denies bleeding complications.  08/20/2020 hemoglobin 14.7.  CHA2DS2-VASc Score = 4 [CHF History: No, HTN History: Yes, Diabetes History: No, Stroke History: No, Vascular Disease History: Yes, Age Score: 2, Gender Score: 0].  Therefore, the patient's annual risk of stroke is 4.8 %.     Hypertension- BP well controlled since transition from losartan to valsartan by primary care provider.  Continue current antihypertensive regimen.   Coronary artery disease-stable  no anginal symptoms.  Medication for ischemic evaluation at this time.  GDMT includes beta-blocker, statin.  No aspirin secondary to chronic anticoagulation. Heart healthy diet and regular cardiovascular exercise encouraged.   HLD, LDL goal less than 70- 05/2020 LDL 45.  Cntinue atorvastatin 10 mg daily.  Lipids monitored by primary care provider.  RBBB/first-degree AV block-reports no lightheadedness, dizziness, near-syncope, syncope.  Continue to monitor with periodic EKG.  Disposition: Follow up in 6 months with Dr. Lovena Le and in 1 year(s) with Dr. Tamala Julian or APP  Signed, Loel Dubonnet, NP 09/23/2020, 8:29 AM [place]

## 2020-10-10 ENCOUNTER — Other Ambulatory Visit: Payer: Self-pay | Admitting: Internal Medicine

## 2020-10-10 DIAGNOSIS — I480 Paroxysmal atrial fibrillation: Secondary | ICD-10-CM

## 2020-10-10 NOTE — Telephone Encounter (Signed)
Eliquis 5mg  refill request received. Patient is 80 years old, weight-89.8kg, Crea-1.42 on 06/11/2020, Diagnosis-Afib, and last seen by Terie Purser, NP on 09/23/20. Dose is appropriate based on dosing criteria. Will send in refill to requested pharmacy.

## 2020-10-14 ENCOUNTER — Other Ambulatory Visit: Payer: Self-pay | Admitting: Interventional Cardiology

## 2020-11-13 DIAGNOSIS — R7401 Elevation of levels of liver transaminase levels: Secondary | ICD-10-CM | POA: Diagnosis not present

## 2020-11-13 DIAGNOSIS — E1169 Type 2 diabetes mellitus with other specified complication: Secondary | ICD-10-CM | POA: Diagnosis not present

## 2020-11-13 DIAGNOSIS — Z79899 Other long term (current) drug therapy: Secondary | ICD-10-CM | POA: Diagnosis not present

## 2020-11-13 DIAGNOSIS — I10 Essential (primary) hypertension: Secondary | ICD-10-CM | POA: Diagnosis not present

## 2020-11-13 DIAGNOSIS — E7800 Pure hypercholesterolemia, unspecified: Secondary | ICD-10-CM | POA: Diagnosis not present

## 2020-12-25 DIAGNOSIS — L814 Other melanin hyperpigmentation: Secondary | ICD-10-CM | POA: Diagnosis not present

## 2020-12-25 DIAGNOSIS — L821 Other seborrheic keratosis: Secondary | ICD-10-CM | POA: Diagnosis not present

## 2020-12-25 DIAGNOSIS — L298 Other pruritus: Secondary | ICD-10-CM | POA: Diagnosis not present

## 2020-12-25 DIAGNOSIS — L309 Dermatitis, unspecified: Secondary | ICD-10-CM | POA: Diagnosis not present

## 2020-12-25 DIAGNOSIS — D225 Melanocytic nevi of trunk: Secondary | ICD-10-CM | POA: Diagnosis not present

## 2020-12-25 DIAGNOSIS — L819 Disorder of pigmentation, unspecified: Secondary | ICD-10-CM | POA: Diagnosis not present

## 2021-02-06 DIAGNOSIS — Z23 Encounter for immunization: Secondary | ICD-10-CM | POA: Diagnosis not present

## 2021-02-10 DIAGNOSIS — C44729 Squamous cell carcinoma of skin of left lower limb, including hip: Secondary | ICD-10-CM | POA: Diagnosis not present

## 2021-02-10 DIAGNOSIS — L570 Actinic keratosis: Secondary | ICD-10-CM | POA: Diagnosis not present

## 2021-02-10 DIAGNOSIS — L538 Other specified erythematous conditions: Secondary | ICD-10-CM | POA: Diagnosis not present

## 2021-02-10 DIAGNOSIS — D492 Neoplasm of unspecified behavior of bone, soft tissue, and skin: Secondary | ICD-10-CM | POA: Diagnosis not present

## 2021-02-10 DIAGNOSIS — L820 Inflamed seborrheic keratosis: Secondary | ICD-10-CM | POA: Diagnosis not present

## 2021-03-10 DIAGNOSIS — U071 COVID-19: Secondary | ICD-10-CM | POA: Diagnosis not present

## 2021-03-12 ENCOUNTER — Telehealth: Payer: Self-pay | Admitting: Internal Medicine

## 2021-03-12 DIAGNOSIS — U071 COVID-19: Secondary | ICD-10-CM | POA: Diagnosis not present

## 2021-03-12 NOTE — Telephone Encounter (Signed)
Yes Paxlovid interacts with his Eliquis. It also interacts with his atorvastatin, amlodipine, Toprol, and valsartan.  Would see if he's able to be prescribed molnupiravir instead to avoid all of these drug interactions.  If he does take Paxlovid, he will need to stop taking his atorvastatin while he's on Paxlovid and only take 1/2 his normal dose of Eliquis (should take 2.5mg  BID) while on Paxlovid. Paxlovid will also increase concentrations of his his amlodipine, valsartan, and Toprol which could lower his BP and HR. He should keep an eye on it while taking Paxlovid in case a med needs to be held.

## 2021-03-12 NOTE — Telephone Encounter (Signed)
Pt c/o medication issue:  1. Name of Medication:  Paxlovid apixaban (ELIQUIS) 5 MG TABS tablet  2. How are you currently taking this medication (dosage and times per day)? Eliquis 1 tablet twice a day, has not started paxlovid  3. Are you having a reaction (difficulty breathing--STAT)? no  4. What is your medication issue? Patient states he has covid and was prescribed paxlovid, but in order to take it he would need to reduce the medication by half. He would like to know if that is okay. He states if he does not answer his home number to try his mobile number.

## 2021-03-12 NOTE — Telephone Encounter (Signed)
Returned call to Pt.  Advised that paxlovid interacted with a few of his medications.  Recommended he take molnupiravir instead.  Pt indicates understanding and will contact his PCP.

## 2021-03-17 ENCOUNTER — Ambulatory Visit: Payer: Medicare Other | Admitting: Internal Medicine

## 2021-03-21 DIAGNOSIS — Z20822 Contact with and (suspected) exposure to covid-19: Secondary | ICD-10-CM | POA: Diagnosis not present

## 2021-05-20 DIAGNOSIS — I4892 Unspecified atrial flutter: Secondary | ICD-10-CM | POA: Diagnosis not present

## 2021-05-20 DIAGNOSIS — M109 Gout, unspecified: Secondary | ICD-10-CM | POA: Diagnosis not present

## 2021-05-20 DIAGNOSIS — E1169 Type 2 diabetes mellitus with other specified complication: Secondary | ICD-10-CM | POA: Diagnosis not present

## 2021-05-20 DIAGNOSIS — E7800 Pure hypercholesterolemia, unspecified: Secondary | ICD-10-CM | POA: Diagnosis not present

## 2021-05-20 DIAGNOSIS — Z79899 Other long term (current) drug therapy: Secondary | ICD-10-CM | POA: Diagnosis not present

## 2021-05-20 DIAGNOSIS — I671 Cerebral aneurysm, nonruptured: Secondary | ICD-10-CM | POA: Diagnosis not present

## 2021-05-20 DIAGNOSIS — I10 Essential (primary) hypertension: Secondary | ICD-10-CM | POA: Diagnosis not present

## 2021-05-20 DIAGNOSIS — Z0001 Encounter for general adult medical examination with abnormal findings: Secondary | ICD-10-CM | POA: Diagnosis not present

## 2021-05-20 DIAGNOSIS — N5201 Erectile dysfunction due to arterial insufficiency: Secondary | ICD-10-CM | POA: Diagnosis not present

## 2021-05-20 DIAGNOSIS — D6869 Other thrombophilia: Secondary | ICD-10-CM | POA: Diagnosis not present

## 2021-05-26 ENCOUNTER — Ambulatory Visit (INDEPENDENT_AMBULATORY_CARE_PROVIDER_SITE_OTHER): Payer: Medicare Other | Admitting: Internal Medicine

## 2021-05-26 ENCOUNTER — Other Ambulatory Visit: Payer: Self-pay

## 2021-05-26 ENCOUNTER — Encounter: Payer: Self-pay | Admitting: Internal Medicine

## 2021-05-26 VITALS — BP 162/94 | HR 66 | Ht 70.0 in | Wt 190.8 lb

## 2021-05-26 DIAGNOSIS — I480 Paroxysmal atrial fibrillation: Secondary | ICD-10-CM

## 2021-05-26 MED ORDER — AMLODIPINE BESYLATE 10 MG PO TABS
10.0000 mg | ORAL_TABLET | Freq: Every day | ORAL | 3 refills | Status: DC
Start: 2021-05-26 — End: 2022-06-18

## 2021-05-26 NOTE — Patient Instructions (Addendum)
Medication Instructions:  Your physician has recommended you make the following change in your medication:    INCREASE your amlodipine-- Take 10 mg by mouth daily  Labwork: None ordered.  Testing/Procedures: None ordered.  Follow-Up: Your physician wants you to follow-up in: one year with Cristopher Peru, MD  You will receive a reminder letter in the mail two months in advance. If you don't receive a letter, please call our office to schedule the follow-up appointment.   Any Other Special Instructions Will Be Listed Below (If Applicable).  If you need a refill on your cardiac medications before your next appointment, please call your pharmacy.

## 2021-05-26 NOTE — Progress Notes (Signed)
HPI [name] returns for ongoing evaluation and management of atrial fib. He is a pleasant 81 yo man with HTN, dyslipidemia, and PAF, s/p multiple ablations. He stopped his dofetilide over a year ago. He has had some break throughs of atrial fib but these typically only last an hour or so. No syncope. He notes that his bp is better at home though he admits to not always taking it. He notes that he is still usually over 140 mm Hg.  Allergies  Allergen Reactions   Codeine Itching   Sulfa Antibiotics Other (See Comments)    Reactions: nausea, shaking of the body     Current Outpatient Medications  Medication Sig Dispense Refill   acetaminophen (TYLENOL) 500 MG tablet Take 1,000 mg by mouth every 6 (six) hours as needed. pain      allopurinol (ZYLOPRIM) 300 MG tablet Take 300 mg by mouth daily.       amLODipine (NORVASC) 5 MG tablet TAKE 1 TABLET BY MOUTH EVERY DAY 90 tablet 3   apixaban (ELIQUIS) 5 MG TABS tablet TAKE 1 TABLET TWICE A DAY 180 tablet 2   atorvastatin (LIPITOR) 10 MG tablet TAKE 1 TABLET BY MOUTH EVERY DAY 90 tablet 3   chlorpheniramine (CHLOR-TRIMETON) 4 MG tablet Take 4 mg by mouth 2 (two) times daily as needed. allergies      metoprolol succinate (TOPROL-XL) 50 MG 24 hr tablet TAKE 1 TABLET (50 MG TOTAL) BY MOUTH DAILY. TAKE WITH OR IMMEDIATELY FOLLOWING A MEAL. 90 tablet 3   multivitamin-lutein (OCUVITE-LUTEIN) CAPS Take 1 capsule by mouth daily.       valsartan (DIOVAN) 320 MG tablet Take 320 mg by mouth daily.     No current facility-administered medications for this visit.     Past Medical History:  Diagnosis Date   Atrial fibrillation ([place])    Onset 1991 in [place]   Cerebral aneurysm without rupture    treated with coil embolization 2006   Gout    Hemochromatosis    Has seen Randy Evans   Hypertension     ROS:   All systems reviewed and negative except as noted in the HPI.   Past Surgical History:  Procedure Laterality Date   ANEURYSM  COILING  2006   bone spur removal  1951   below left knee   BRAIN SURGERY     CARDIAC ELECTROPHYSIOLOGY MAPPING AND ABLATION  01/2008; 07/2008   CARDIOVERSION  2000; 2008 03/2008; ;02/15/2011   2008; 2009 2012:    Surgeon: Kerry Hough., MD;  Location: [facility];  Service: Cardiovascular;  Laterality: N/A;   CARDIOVERSION  08/05/2011   Procedure: CARDIOVERSION;  Surgeon: Jacolyn Reedy, MD;  Location: [facility];  Service: Cardiovascular;  Laterality: N/A;   CATARACT EXTRACTION W/ INTRAOCULAR LENS  IMPLANT, BILATERAL  2009-2010   IR ANGIO INTRA EXTRACRAN SEL COM CAROTID INNOMINATE UNI L MOD SED  11/09/2016   IR ANGIO INTRA EXTRACRAN SEL COM CAROTID INNOMINATE UNI R MOD SED  07/17/2019   IR ANGIO INTRA EXTRACRAN SEL INTERNAL CAROTID UNI R MOD SED  11/09/2016   IR US GUIDE VASC ACCESS RIGHT  07/17/2019   LIVER BIOPSY  late 1990's   TOE SURGERY       History reviewed. No pertinent family history.   Social History   Socioeconomic History   Marital status: Married    Spouse name: Not on file   Number of children: Not on file   Years  of education: Not on file   Highest education level: Not on file  Occupational History   Not on file  Tobacco Use   Smoking status: Former    Years: 4.00    Types: Cigarettes, Pipe, Cigars   Smokeless tobacco: Never   Tobacco comments:    quit smoking 1966  Substance and Sexual Activity   Alcohol use: Yes    Alcohol/week: 2.0 standard drinks    Types: 2 Cans of beer per week   Drug use: No   Sexual activity: Yes  Other Topics Concern   Not on file  Social History Narrative   Retired from [employer].  Moved from [place]. Graduate of [employer].  Married.   Social Determinants of Health   Financial Resource Strain: Not on file  Food Insecurity: Not on file  Transportation Needs: Not on file  Physical Activity: Not on file  Stress: Not on file  Social Connections: Not on file  Intimate Partner Violence: Not on file     BP (!) 162/94    Pulse 66    Ht  5\' 10"  (1.778 m)    Wt 190 lb 12.8 oz (86.5 kg)    SpO2 95%    BMI 27.38 kg/m   Physical Exam:  Well appearing NAD HEENT: Unremarkable Neck:  No JVD, no thyromegally Lymphatics:  No adenopathy Back:  No CVA tenderness Lungs:  Clear with no wheezes HEART:  Regular rate rhythm, no murmurs, no rubs, no clicks Abd:  soft, positive bowel sounds, no organomegally, no rebound, no guarding Ext:  2 plus pulses, no edema, no cyanosis, no clubbing Skin:  No rashes no nodules Neuro:  CN II through XII intact, motor grossly intact  EKG - nsr with first degree AV block and RBBB  Assess/Plan:  1. PAF - he is mostly maintaining NSR. I recommended he continue his anticoagulation and beta blocker.  2. CAD - he denies anginal symptoms. 3. dylipidemia - he will continue lipitor. 4. Weight gain - he is down 6 lbs from his last visit and I encouraged him to try and lose 15 more. 5. HTN - his bp at home is elevated and I asked him to try increasing the dose of amlodipine to 10 mg daily.   Randy Overlie Jumanah Hynson,MD

## 2021-06-16 DIAGNOSIS — L821 Other seborrheic keratosis: Secondary | ICD-10-CM | POA: Diagnosis not present

## 2021-06-16 DIAGNOSIS — L814 Other melanin hyperpigmentation: Secondary | ICD-10-CM | POA: Diagnosis not present

## 2021-06-16 DIAGNOSIS — L908 Other atrophic disorders of skin: Secondary | ICD-10-CM | POA: Diagnosis not present

## 2021-06-16 DIAGNOSIS — Z08 Encounter for follow-up examination after completed treatment for malignant neoplasm: Secondary | ICD-10-CM | POA: Diagnosis not present

## 2021-06-16 DIAGNOSIS — D225 Melanocytic nevi of trunk: Secondary | ICD-10-CM | POA: Diagnosis not present

## 2021-06-16 DIAGNOSIS — L570 Actinic keratosis: Secondary | ICD-10-CM | POA: Diagnosis not present

## 2021-06-16 DIAGNOSIS — L298 Other pruritus: Secondary | ICD-10-CM | POA: Diagnosis not present

## 2021-06-16 DIAGNOSIS — B353 Tinea pedis: Secondary | ICD-10-CM | POA: Diagnosis not present

## 2021-06-16 DIAGNOSIS — L738 Other specified follicular disorders: Secondary | ICD-10-CM | POA: Diagnosis not present

## 2021-06-16 DIAGNOSIS — Z85828 Personal history of other malignant neoplasm of skin: Secondary | ICD-10-CM | POA: Diagnosis not present

## 2021-07-08 ENCOUNTER — Other Ambulatory Visit: Payer: Self-pay | Admitting: Internal Medicine

## 2021-07-08 DIAGNOSIS — I480 Paroxysmal atrial fibrillation: Secondary | ICD-10-CM

## 2021-07-08 NOTE — Telephone Encounter (Signed)
Prescription refill request for Eliquis received. ?Indication: Afib  ?Last office visit: 05/26/21 Randy Evans) ?Scr: 0.65 (05/20/21 via [place]) ?Age: 81 ?Weight: 86.5kg ? ?Appropriate dose and refill sent to requested pharmacy.  ? ? ?

## 2021-08-07 ENCOUNTER — Other Ambulatory Visit: Payer: Self-pay | Admitting: Interventional Cardiology

## 2021-08-20 IMAGING — US IR CARO CERE HEAD/NECK UNILAT RIGHT (MS)
1 series · 1 of 1 positions shown · non-contrast
Comparison: none

PROCEDURE:
DIAGNOSTIC CEREBRAL ANGIOGRAM
HISTORY: The patient is a 79-year-old man with a history of anterior
communicating artery aneurysm coiling about 15 years ago. He did
undergo follow-up angiography about 3 years ago, however more recent
MRA suggested the possibility of increase in aneurysm residual. He
therefore presents today for follow-up diagnostic cerebral
angiogram.
TECHNIQUE: CATHETERS AND WIRES
5-French Himmons-Z glide catheter

[Series 1: (phone_number) · 1 of 1 slices shown]
[im 1/1]
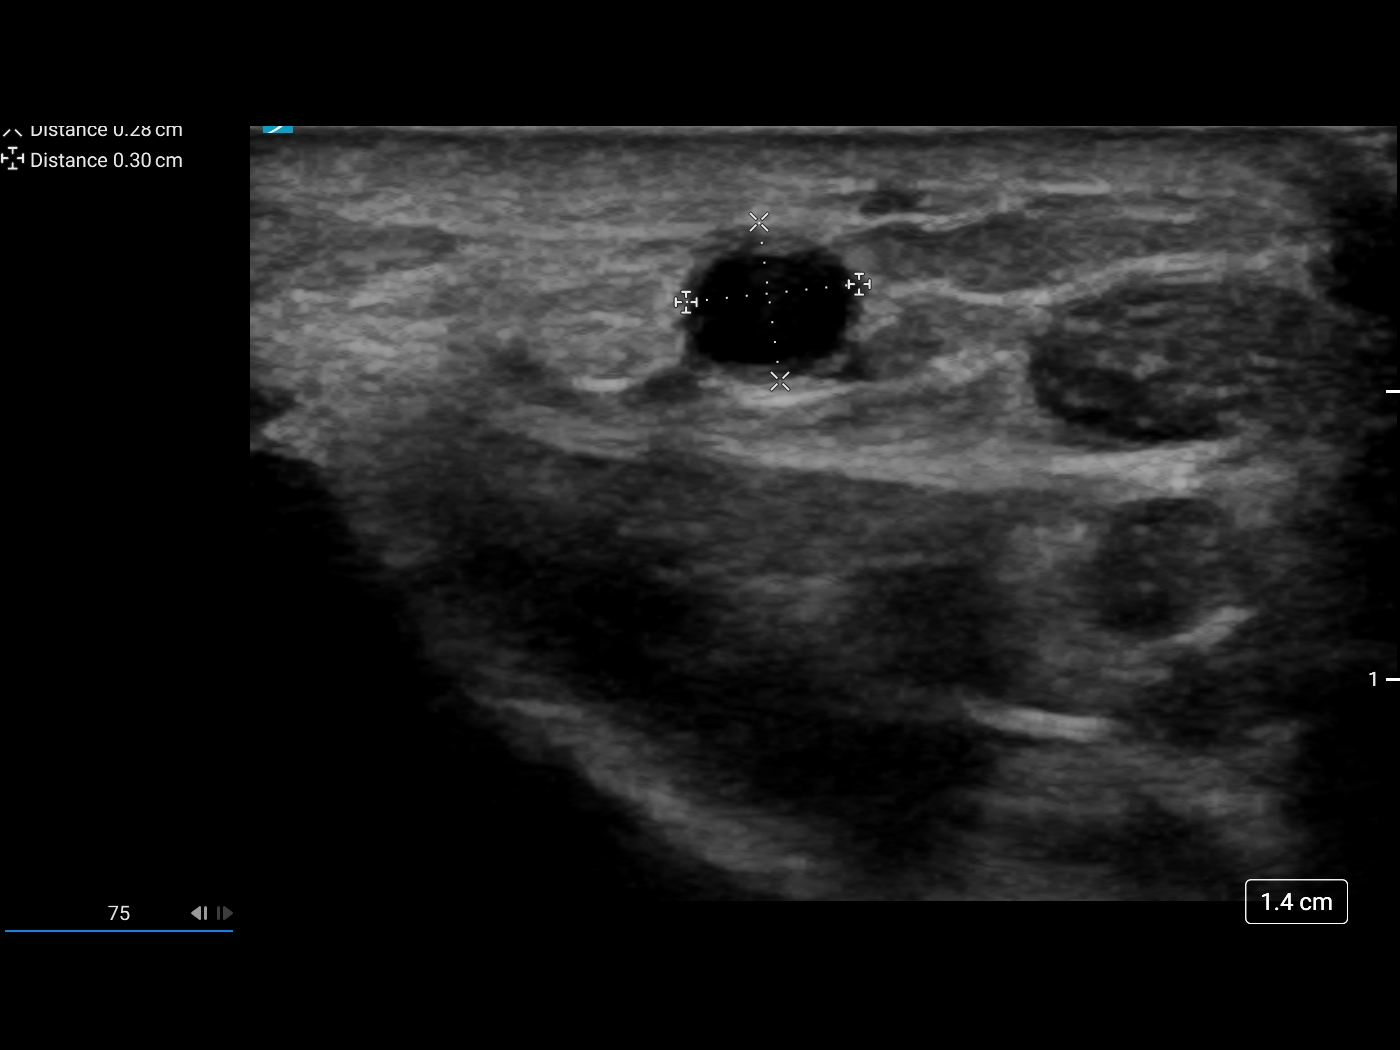

[1 of 1 positions shown; findings below may reference images not displayed]

ACCESS:
The technical aspects of the procedure as well as its potential
risks and benefits were reviewed with the patient. These risks
included but were not limited bleeding, infection, allergic
reaction, damage to organs or vital structures, stroke,
non-diagnostic procedure, and the catastrophic outcomes of heart
attack, coma, and death. With an understanding of these risks,
informed consent was obtained and witnessed. The patient was placed
in the supine position on the angiography table and the skin of
right groin prepped in the usual sterile fashion.

The procedure was performed under local anesthesia (1%-solution of
bicarbonate-buffered Lidocaine) and conscious sedation with 1mg
versed and 40micrograms fentanyl monitored by myself and the
in-suite nurse using continuous pulse-oximetry, heart rate, and
non-invasive blood-pressure.

A 5- French sheath was introduced in the right radial artery under
ultrasound guidance using Seldinger technique. Ultrasound was used
to directly visualize endoluminal access to the right radial artery
with the micropuncture needle. A fluoro-phase sequence was used to
document the sheath position.

MEDICATIONS:
Heparin: 5444 Units total.

Verapamil: 2.5mg

Nitroglycerin: 966micrograms

CONTRAST:  25mL OMNIPAQUE IOHEXOL 300 MG/ML  SOLNcc, Omnipaque 300

FLUOROSCOPY TIME:  FLUOROSCOPY TIME: See IR records
0.035" glidewire

VESSELS CATHETERIZED
Right internal carotid

Right radial

VESSELS STUDIED
Right internal carotid, head

Right radial

PROCEDURAL NARRATIVE
A 5-Bettina Salais catheter was advanced over a 0.035 glidewire into the
aortic arch. Secondary curve was reformed. The right internal
carotid artery was then catheterized using road map technique.
Cerebral angiograms were then taken. After review of images, the
catheter was removed without incident.
FINDINGS: Right internal carotid: head:

Injection reveals the presence of a widely patent ICA, M1, and A1
segments and their branches. Again seen is a broad-based aneurysm
residual with coils within the dome of an anterior communicating
artery aneurysm. In comparison to the prior angiogram of 0115, the
aneurysm residual appears to be stable, measuring approximately
x 2.5 mm. The parenchymal and venous phases are normal. The venous
sinuses are widely patent.

Right radial:

Normal vessel. No significant atherosclerotic disease. Arterial
sheath in adequate position.

DISPOSITION:
Upon completion of the study, the radial sheath was removed and
patent hemostasis was obtained with application of the Terumo TR
band. The procedure was well tolerated and no early complications
were observed. The patient was transferred to the holding area for
further care and removal of the TR band.
IMPRESSION: 1. Stable appearance of wide based anterior communicating artery
aneurysm residual in comparison to angiogram 3 years ago.

The preliminary results of this procedure were shared with the
patient and the patient's family.

## 2021-08-25 DIAGNOSIS — H52203 Unspecified astigmatism, bilateral: Secondary | ICD-10-CM | POA: Diagnosis not present

## 2021-08-25 DIAGNOSIS — H353131 Nonexudative age-related macular degeneration, bilateral, early dry stage: Secondary | ICD-10-CM | POA: Diagnosis not present

## 2021-08-25 DIAGNOSIS — H35373 Puckering of macula, bilateral: Secondary | ICD-10-CM | POA: Diagnosis not present

## 2021-08-25 DIAGNOSIS — Z961 Presence of intraocular lens: Secondary | ICD-10-CM | POA: Diagnosis not present

## 2021-09-03 ENCOUNTER — Telehealth: Payer: Self-pay | Admitting: Interventional Cardiology

## 2021-09-03 DIAGNOSIS — L300 Nummular dermatitis: Secondary | ICD-10-CM | POA: Diagnosis not present

## 2021-09-03 DIAGNOSIS — L817 Pigmented purpuric dermatosis: Secondary | ICD-10-CM | POA: Diagnosis not present

## 2021-09-03 DIAGNOSIS — B353 Tinea pedis: Secondary | ICD-10-CM | POA: Diagnosis not present

## 2021-09-03 NOTE — Telephone Encounter (Signed)
Need to clarify if terbinafine is topical or oral. Topical is fine. Oral formulation does interact with his metoprolol and can increase concentrations of metoprolol. Pt would need to monitor for lower HR/BP while on terbinafine and may potentially need dose decrease of metoprolol if he's on terbinafine long-term.

## 2021-09-03 NOTE — Telephone Encounter (Signed)
Dermatologist prescribing terbinafine it affects metoprolol,  recommendation for patient regarding taking medication.

## 2021-09-04 NOTE — Telephone Encounter (Signed)
Spoke with patient and discussed recommendation from pharmacist.  Per Fuller Canada, RPH-CPP: Need to clarify if terbinafine is topical or oral. Topical is fine. Oral formulation does interact with his metoprolol and can increase concentrations of metoprolol. Pt would need to monitor for lower HR/BP while on terbinafine and may potentially need dose decrease of metoprolol if he's on terbinafine long-term.  Patient states he will be taking the oral form for 2 weeks. Advised patient to monitor HR/BP and to call us with readings lower than his normal range. Patient verbalized understanding.

## 2021-09-27 NOTE — Progress Notes (Signed)
Cardiology Office Note:    Date:  09/28/2021   ID:  Armstrong, Lollis March 28, 1941, MRN [id number]  PCP:  Darrow Bussing, MD  Cardiologist:  Lesleigh Noe, MD   Referring MD: Darrow Bussing, MD   Chief Complaint  Patient presents with   Atrial Fibrillation   Hypertension   Follow-up    Anticoagulation    History of Present Illness:    CEBERT PERLICK is a 81 y.o. male with a hx of  paroxysmal atrial fibrillation, paroxysmal atypical atrial flutter and fibrillation (ablation x3), most recently 2013 at [facility] by Dr.Banhson.  On dofetilide and wants to DC--> was DC'd after appointment with Dr. Ladona Ridgel in May 2021. Other history includes hemochromatosis, hypertension, and chronic anticoagulation therapy.      He notices some mild ankle swelling since having the dose of amlodipine increased by Dr. Ladona Ridgel.  No cardiac complaints are noted, specifically no dyspnea on exertion, orthopnea, PND, palpitations, or syncope.  Past Medical History:  Diagnosis Date   Atrial fibrillation (HCC)    Onset 1991 in [place]   Cerebral aneurysm without rupture    treated with coil embolization 2006   Gout    Hemochromatosis    Has seen Dorena Cookey   Hypertension     Past Surgical History:  Procedure Laterality Date   ANEURYSM COILING  2006   bone spur removal  1951   below left knee   BRAIN SURGERY     CARDIAC ELECTROPHYSIOLOGY MAPPING AND ABLATION  01/2008; 07/2008   CARDIOVERSION  2000; 2008 03/2008; ;02/15/2011   2008; 2009 2012:    Surgeon: Darden Palmer., MD;  Location: [facility] OR;  Service: Cardiovascular;  Laterality: N/A;   CARDIOVERSION  08/05/2011   Procedure: CARDIOVERSION;  Surgeon: Othella Boyer, MD;  Location: [facility] OR;  Service: Cardiovascular;  Laterality: N/A;   CATARACT EXTRACTION W/ INTRAOCULAR LENS  IMPLANT, BILATERAL  2009-2010   IR ANGIO INTRA EXTRACRAN SEL COM CAROTID INNOMINATE UNI L MOD SED  11/09/2016   IR ANGIO INTRA EXTRACRAN SEL COM  CAROTID INNOMINATE UNI R MOD SED  07/17/2019   IR ANGIO INTRA EXTRACRAN SEL INTERNAL CAROTID UNI R MOD SED  11/09/2016   IR US GUIDE VASC ACCESS RIGHT  07/17/2019   LIVER BIOPSY  late 1990's   TOE SURGERY      Current Medications: Current Meds  Medication Sig   acetaminophen (TYLENOL) 500 MG tablet Take 1,000 mg by mouth every 6 (six) hours as needed. pain    allopurinol (ZYLOPRIM) 300 MG tablet Take 300 mg by mouth daily.     amLODipine (NORVASC) 10 MG tablet Take 1 tablet (10 mg total) by mouth daily.   apixaban (ELIQUIS) 5 MG TABS tablet TAKE 1 TABLET TWICE A DAY   atorvastatin (LIPITOR) 10 MG tablet TAKE 1 TABLET BY MOUTH EVERY DAY   chlorpheniramine (CHLOR-TRIMETON) 4 MG tablet Take 4 mg by mouth 2 (two) times daily as needed. allergies    metoprolol succinate (TOPROL-XL) 50 MG 24 hr tablet TAKE 1 TABLET BY MOUTH DAILY. TAKE WITH OR IMMEDIATELY FOLLOWING A MEAL.   multivitamin-lutein (OCUVITE-LUTEIN) CAPS Take 1 capsule by mouth daily.     valsartan (DIOVAN) 320 MG tablet Take 320 mg by mouth daily.     Allergies:   Codeine and Sulfa antibiotics   Social History   Socioeconomic History   Marital status: Married    Spouse name: Not on file   Number of children:  Not on file   Years of education: Not on file   Highest education level: Not on file  Occupational History   Not on file  Tobacco Use   Smoking status: Former    Years: 4.00    Types: Cigarettes, Pipe, Cigars   Smokeless tobacco: Never   Tobacco comments:    quit smoking 1966  Substance and Sexual Activity   Alcohol use: Yes    Alcohol/week: 2.0 standard drinks of alcohol    Types: 2 Cans of beer per week   Drug use: No   Sexual activity: Yes  Other Topics Concern   Not on file  Social History Narrative   Retired from [employer].  Moved from [place]. Graduate of [employer].  Married.   Social Determinants of Health   Financial Resource Strain: Not on file  Food Insecurity: Not on file  Transportation Needs: Not  on file  Physical Activity: Not on file  Stress: Not on file  Social Connections: Not on file     Family History: The patient's family history is not on file.  ROS:   Please see the history of present illness.    He is not been quite as active.  His wife has become increasingly dependent due to cognitive function decline and behavioral health issues.  She is calm.  He does not sleep as well anymore because he is awakened couple times per night to help her.  All other systems reviewed and are negative.  EKGs/Labs/Other Studies Reviewed:    The following studies were reviewed today: No new imaging Cardiac MRI 2021 demonstrated LVEF 65% with some late gadolinium uptake in the mid inferolateral wall.  Mild left atrial enlargement.  Mild ascending aortic aneurysm diameter 4.2 mm.  EKG:  EKG performed 05/26/2021 demonstrates right bundle, left anterior hemiblock, and first-degree AV block.  Recent Labs: No results found for requested labs within last 365 days.  Recent Lipid Panel No results found for: "CHOL", "TRIG", "HDL", "CHOLHDL", "VLDL", "LDLCALC", "LDLDIRECT"  Physical Exam:    VS:  BP 132/78   Pulse (!) 52   Ht 5\' 10"  (1.778 m)   Wt 195 lb 12.8 oz (88.8 kg)   SpO2 97%   BMI 28.09 kg/m     Wt Readings from Last 3 Encounters:  09/28/21 195 lb 12.8 oz (88.8 kg)  05/26/21 190 lb 12.8 oz (86.5 kg)  09/23/20 198 lb (89.8 kg)     GEN: Overweight. No acute distress HEENT: Normal NECK: No JVD. LYMPHATICS: No lymphadenopathy CARDIAC: No murmur. RRR no gallop, or edema. VASCULAR:  Normal Pulses. No bruits. RESPIRATORY:  Clear to auscultation without rales, wheezing or rhonchi  ABDOMEN: Soft, non-tender, non-distended, No pulsatile mass, MUSCULOSKELETAL: No deformity  SKIN: Warm and dry NEUROLOGIC:  Alert and oriented x 3 PSYCHIATRIC:  Normal affect   ASSESSMENT:    1. Paroxysmal atrial fibrillation (HCC)   2. Aortic dilatation (HCC)   3. Chronic anticoagulation   4.  S/P ablation of atrial fibrillation   5. Essential hypertension   6. CAD in native artery   7. Hyperlipidemia LDL goal <70    PLAN:    In order of problems listed above:  No clinically or symptomatically identified episodes of atrial fibrillation and greater than a year.  He is off dofetilide therapy. Noted coincidentally on MRI performed in 2021.  He should probably have a CT scan of the aorta to follow for evidence of further enlargement. Continue apixaban therapy. Noted Blood pressures under  much better control on amlodipine 10 mg daily along with metoprolol succinate. No clinical symptoms. Continue atorvastatin.  The most recent LDL cholesterol 50.  Continue same therapy.  Overall education and awareness concerning primary/secondary risk prevention was discussed in detail: LDL less than 70, hemoglobin A1c less than 7, blood pressure target less than 130/80 mmHg, >150 minutes of moderate aerobic activity per week, avoidance of smoking, weight control (via diet and exercise), and continued surveillance/management of/for obstructive sleep apnea.    Medication Adjustments/Labs and Tests Ordered: Current medicines are reviewed at length with the patient today.  Concerns regarding medicines are outlined above.  No orders of the defined types were placed in this encounter.  No orders of the defined types were placed in this encounter.   Patient Instructions  Medication Instructions:  Your physician recommends that you continue on your current medications as directed. Please refer to the Current Medication list given to you today.  *If you need a refill on your cardiac medications before your next appointment, please call your pharmacy*  Lab Work: NONE  Testing/Procedures: NONE  Follow-Up: At [facility], you and your health needs are our priority.  As part of our continuing mission to provide you with exceptional heart care, we have created designated Provider Care Teams.   These Care Teams include your primary Cardiologist (physician) and Advanced Practice Providers (APPs -  Physician Assistants and Nurse Practitioners) who all work together to provide you with the care you need, when you need it.  Your next appointment:   1 year(s)  The format for your next appointment:   In Person  Provider:   Lesleigh Noe, MD {   Important Information About Sugar         Signed, Lesleigh Noe, MD  09/28/2021 8:20 AM    [facility]

## 2021-09-28 ENCOUNTER — Telehealth: Payer: Self-pay

## 2021-09-28 ENCOUNTER — Encounter: Payer: Self-pay | Admitting: Interventional Cardiology

## 2021-09-28 ENCOUNTER — Ambulatory Visit (INDEPENDENT_AMBULATORY_CARE_PROVIDER_SITE_OTHER): Payer: Medicare Other | Admitting: Interventional Cardiology

## 2021-09-28 VITALS — BP 132/78 | HR 52 | Ht 70.0 in | Wt 195.8 lb

## 2021-09-28 DIAGNOSIS — I10 Essential (primary) hypertension: Secondary | ICD-10-CM | POA: Diagnosis not present

## 2021-09-28 DIAGNOSIS — Z7901 Long term (current) use of anticoagulants: Secondary | ICD-10-CM

## 2021-09-28 DIAGNOSIS — Z9889 Other specified postprocedural states: Secondary | ICD-10-CM | POA: Diagnosis not present

## 2021-09-28 DIAGNOSIS — I77819 Aortic ectasia, unspecified site: Secondary | ICD-10-CM | POA: Diagnosis not present

## 2021-09-28 DIAGNOSIS — E785 Hyperlipidemia, unspecified: Secondary | ICD-10-CM | POA: Diagnosis not present

## 2021-09-28 DIAGNOSIS — Z79899 Other long term (current) drug therapy: Secondary | ICD-10-CM

## 2021-09-28 DIAGNOSIS — I480 Paroxysmal atrial fibrillation: Secondary | ICD-10-CM | POA: Diagnosis not present

## 2021-09-28 DIAGNOSIS — Z8679 Personal history of other diseases of the circulatory system: Secondary | ICD-10-CM

## 2021-09-28 DIAGNOSIS — I2510 Atherosclerotic heart disease of native coronary artery without angina pectoris: Secondary | ICD-10-CM

## 2021-10-02 ENCOUNTER — Other Ambulatory Visit: Payer: Self-pay | Admitting: Interventional Cardiology

## 2021-10-02 DIAGNOSIS — I10 Essential (primary) hypertension: Secondary | ICD-10-CM

## 2021-10-05 ENCOUNTER — Other Ambulatory Visit: Payer: Medicare Other | Admitting: *Deleted

## 2021-10-05 DIAGNOSIS — I10 Essential (primary) hypertension: Secondary | ICD-10-CM

## 2021-10-05 LAB — BASIC METABOLIC PANEL
BUN/Creatinine Ratio: 19 (ref 10–24)
BUN: 14 mg/dL (ref 8–27)
CO2: 22 mmol/L (ref 20–29)
Calcium: 8.5 mg/dL — ABNORMAL LOW (ref 8.6–10.2)
Chloride: 104 mmol/L (ref 96–106)
Creatinine, Ser: 0.75 mg/dL — ABNORMAL LOW (ref 0.76–1.27)
Glucose: 163 mg/dL — ABNORMAL HIGH (ref 70–99)
Potassium: 3.9 mmol/L (ref 3.5–5.2)
Sodium: 141 mmol/L (ref 134–144)
eGFR: 92 mL/min/{1.73_m2} (ref >59)

## 2021-10-09 ENCOUNTER — Ambulatory Visit (HOSPITAL_COMMUNITY)
Admission: RE | Admit: 2021-10-09 | Discharge: 2021-10-09 | Disposition: A | Discharge status: Home / Self Care | Payer: Medicare Other | Source: Ambulatory Visit | Attending: Interventional Cardiology | Admitting: Interventional Cardiology

## 2021-10-09 DIAGNOSIS — I7121 Aneurysm of the ascending aorta, without rupture: Secondary | ICD-10-CM | POA: Diagnosis not present

## 2021-10-09 DIAGNOSIS — I77819 Aortic ectasia, unspecified site: Secondary | ICD-10-CM | POA: Diagnosis not present

## 2021-10-09 MED ORDER — IOHEXOL 350 MG/ML SOLN
100.0000 mL | Freq: Once | INTRAVENOUS | Status: AC | PRN
Start: 2021-10-09 — End: 2021-10-09
  Administered 2021-10-09: 100 mL via INTRAVENOUS

## 2021-10-13 ENCOUNTER — Telehealth: Payer: Self-pay

## 2021-10-13 DIAGNOSIS — I77819 Aortic ectasia, unspecified site: Secondary | ICD-10-CM

## 2021-10-13 NOTE — Telephone Encounter (Signed)
-----   Message from Belva Crome, MD sent at 10/12/2021  3:45 PM EDT ----- Let the patient know the aneurysm has grown from 4.2 CM since 2021. Needs Aortic MRA in 6 months and should get appointment to see one of the CT surgeons (Dr. Cyndia Bent) A copy will be sent to Stacie Glaze, DO

## 2021-11-12 DIAGNOSIS — R159 Full incontinence of feces: Secondary | ICD-10-CM | POA: Diagnosis not present

## 2021-11-12 DIAGNOSIS — I714 Abdominal aortic aneurysm, without rupture, unspecified: Secondary | ICD-10-CM | POA: Diagnosis not present

## 2021-11-12 DIAGNOSIS — Z8679 Personal history of other diseases of the circulatory system: Secondary | ICD-10-CM | POA: Diagnosis not present

## 2021-11-12 DIAGNOSIS — E1169 Type 2 diabetes mellitus with other specified complication: Secondary | ICD-10-CM | POA: Diagnosis not present

## 2021-11-15 ENCOUNTER — Other Ambulatory Visit: Payer: Self-pay | Admitting: Interventional Cardiology

## 2021-11-18 DIAGNOSIS — I671 Cerebral aneurysm, nonruptured: Secondary | ICD-10-CM | POA: Diagnosis not present

## 2021-11-18 DIAGNOSIS — Z23 Encounter for immunization: Secondary | ICD-10-CM | POA: Diagnosis not present

## 2021-11-18 DIAGNOSIS — Z79899 Other long term (current) drug therapy: Secondary | ICD-10-CM | POA: Diagnosis not present

## 2021-11-18 DIAGNOSIS — E7800 Pure hypercholesterolemia, unspecified: Secondary | ICD-10-CM | POA: Diagnosis not present

## 2021-11-18 DIAGNOSIS — I10 Essential (primary) hypertension: Secondary | ICD-10-CM | POA: Diagnosis not present

## 2021-11-18 DIAGNOSIS — K900 Celiac disease: Secondary | ICD-10-CM | POA: Insufficient documentation

## 2021-11-18 DIAGNOSIS — I4892 Unspecified atrial flutter: Secondary | ICD-10-CM | POA: Diagnosis not present

## 2021-11-18 DIAGNOSIS — D6869 Other thrombophilia: Secondary | ICD-10-CM | POA: Diagnosis not present

## 2021-11-27 ENCOUNTER — Telehealth: Payer: Self-pay | Admitting: *Deleted

## 2021-11-27 NOTE — Telephone Encounter (Signed)
   Pre-operative Risk Assessment    Patient Name: Randy Evans  DOB: 04-13-1940 MRN: [id number]      Request for Surgical Clearance    Procedure:   COLONOSCOPY ; INCONTINENCE OF FECES  Date of Surgery:  Clearance 01/12/22                                 Surgeon:  DR. Alessandra Bevels Surgeon's Group or Practice Name:  [facility] Phone number:  [phone] Fax number:  [phone]   Type of Clearance Requested:   - Medical  - Pharmacy:  Hold Apixaban (Eliquis) x 1-2 DAYS PRIOR   Type of Anesthesia:   PROPOFOL   Additional requests/questions:    [name]   11/27/2021, 4:41 PM

## 2021-12-01 NOTE — Telephone Encounter (Signed)
Will route to PharmD for rec's re: holding anticoagulation. Then pt will need tele appt arranged. Richardson Dopp, PA-C    12/01/2021 7:57 AM

## 2021-12-02 NOTE — Telephone Encounter (Signed)
Patient with diagnosis of atrial fibrillation on Eliquis for anticoagulation.    Procedure: Colonoscopy Date of procedure: 01/12/22   CHA2DS2-VASc Score = 4   This indicates a 4.8% annual risk of stroke. The patient's score is based upon: CHF History: 0 HTN History: 1 Diabetes History: 0 Stroke History: 0 Vascular Disease History: 1 Age Score: 2 Gender Score: 0   CrCl 82.5 mL/min (Scr 0.75 10/05/21, ideal body weight)   Per office protocol, patient can hold Eliquis for 1-2 days prior to procedure.   Patient will not need bridging with Lovenox (enoxaparin) around procedure.  **This guidance is not considered finalized until pre-operative APP has relayed final recommendations.**

## 2021-12-08 ENCOUNTER — Telehealth: Payer: Self-pay | Admitting: *Deleted

## 2021-12-08 NOTE — Telephone Encounter (Signed)
Pt agreeable to tele pre op appt 12/28/21 @ 2:40. Med rec and consent are done.     Patient Consent for Virtual Visit        Randy Evans has provided verbal consent on 12/08/2021 for a virtual visit (video or telephone).   CONSENT FOR VIRTUAL VISIT FOR:  Randy Evans  By participating in this virtual visit I agree to the following:  I hereby voluntarily request, consent and authorize [facility] and its employed or contracted physicians, physician assistants, nurse practitioners or other licensed health care professionals (the Practitioner), to provide me with telemedicine health care services (the "Services") as deemed necessary by the treating Practitioner. I acknowledge and consent to receive the Services by the Practitioner via telemedicine. I understand that the telemedicine visit will involve communicating with the Practitioner through live audiovisual communication technology and the disclosure of certain medical information by electronic transmission. I acknowledge that I have been given the opportunity to request an in-person assessment or other available alternative prior to the telemedicine visit and am voluntarily participating in the telemedicine visit.  I understand that I have the right to withhold or withdraw my consent to the use of telemedicine in the course of my care at any time, without affecting my right to future care or treatment, and that the Practitioner or I may terminate the telemedicine visit at any time. I understand that I have the right to inspect all information obtained and/or recorded in the course of the telemedicine visit and may receive copies of available information for a reasonable fee.  I understand that some of the potential risks of receiving the Services via telemedicine include:  Delay or interruption in medical evaluation due to technological equipment failure or disruption; Information transmitted may not be sufficient (e.g. poor  resolution of images) to allow for appropriate medical decision making by the Practitioner; and/or  In rare instances, security protocols could fail, causing a breach of personal health information.  Furthermore, I acknowledge that it is my responsibility to provide information about my medical history, conditions and care that is complete and accurate to the best of my ability. I acknowledge that Practitioner's advice, recommendations, and/or decision may be based on factors not within their control, such as incomplete or inaccurate data provided by me or distortions of diagnostic images or specimens that may result from electronic transmissions. I understand that the practice of medicine is not an exact science and that Practitioner makes no warranties or guarantees regarding treatment outcomes. I acknowledge that a copy of this consent can be made available to me via my patient portal ([facility]), or I can request a printed copy by calling the office of [facility].    I understand that my insurance will be billed for this visit.   I have read or had this consent read to me. I understand the contents of this consent, which adequately explains the benefits and risks of the Services being provided via telemedicine.  I have been provided ample opportunity to ask questions regarding this consent and the Services and have had my questions answered to my satisfaction. I give my informed consent for the services to be provided through the use of telemedicine in my medical care

## 2021-12-08 NOTE — Telephone Encounter (Signed)
Pt agreeable to tele pre op appt 12/28/21 @ 2:40. Med rec and consent are done.

## 2021-12-08 NOTE — Telephone Encounter (Signed)
Primary Cardiologist:Henry Carlye Grippe, MD   Preoperative team, please contact this patient and set up a phone call appointment for further preoperative risk assessment. Please obtain consent and complete medication review. Thank you for your help.   Per office protocol, patient can hold Eliquis for 1-2 days prior to procedure.   Patient will not need bridging with Lovenox (enoxaparin) around procedure.    Emmaline Life, NP-C    12/08/2021, 8:38 AM [facility] [address] Office [phone] Fax [phone]

## 2021-12-09 ENCOUNTER — Institutional Professional Consult (permissible substitution) (INDEPENDENT_AMBULATORY_CARE_PROVIDER_SITE_OTHER): Payer: Medicare Other | Admitting: Surgery

## 2021-12-09 ENCOUNTER — Encounter: Payer: Self-pay | Admitting: Surgery

## 2021-12-09 VITALS — BP 154/89 | HR 66 | Resp 18 | Ht 70.0 in | Wt 192.0 lb

## 2021-12-09 DIAGNOSIS — I2510 Atherosclerotic heart disease of native coronary artery without angina pectoris: Secondary | ICD-10-CM

## 2021-12-09 DIAGNOSIS — I77819 Aortic ectasia, unspecified site: Secondary | ICD-10-CM | POA: Diagnosis not present

## 2021-12-09 NOTE — Progress Notes (Signed)
Cardiothoracic Surgery Consultation   PCP is Lujean Amel, MD Referring Provider is Belva Crome, MD  Chief Complaint  Patient presents with   Thoracic Aortic Aneurysm    Chest CTA 10/09/21     HPI:  The patient is a 81 year old gentleman with a history of hypertension, hemochromatosis, atrial fibrillation, cerebral aneurysm status post coil embolization, and a 4.2 cm fusiform ascending aortic aneurysm noted by cardiac MRI in May 2021.  A previous 2D echocardiogram in January 2020 had shown a normal trileaflet aortic valve without stenosis or insufficiency.  Left ventricular ejection fraction was 50 to 55% with no wall motion abnormalities and grade 2 diastolic dysfunction.  That echo showed the ascending aortic diameter to be 4.3 cm.  He recently had a CTA of the chest for follow-up of his aneurysm which showed the maximum diameter to be 4.2 cm with some enlargement of the pulmonary artery at 4.6 cm.  The proximal descending aorta was measured at 3.6 cm.  He continues to feel well and denies chest or back pain and shortness of breath.  Past Medical History:  Diagnosis Date   Atrial fibrillation ([place])    Onset 1991 in [place]   Cerebral aneurysm without rupture    treated with coil embolization 2006   Gout    Hemochromatosis    Has seen Teena Irani   Hypertension     Past Surgical History:  Procedure Laterality Date   ANEURYSM COILING  2006   bone spur removal  1951   below left knee   [place]  01/2008; 07/2008   CARDIOVERSION  2000; 2008 03/2008; ;02/15/2011   2008; 2009 2012:    Surgeon: Kerry Hough., MD;  Location: [facility] OR;  Service: Cardiovascular;  Laterality: N/A;   CARDIOVERSION  08/05/2011   Procedure: CARDIOVERSION;  Surgeon: Jacolyn Reedy, MD;  Location: [place];  Service: Cardiovascular;  Laterality: N/A;   CATARACT EXTRACTION W/ INTRAOCULAR LENS  IMPLANT, BILATERAL  2009-2010   IR ANGIO INTRA EXTRACRAN  SEL COM CAROTID INNOMINATE UNI L MOD SED  11/09/2016   IR ANGIO INTRA EXTRACRAN SEL COM CAROTID INNOMINATE UNI R MOD SED  07/17/2019   IR ANGIO INTRA EXTRACRAN SEL INTERNAL CAROTID UNI R MOD SED  11/09/2016   IR US GUIDE VASC ACCESS RIGHT  07/17/2019   LIVER BIOPSY  late 1990's   TOE SURGERY      History reviewed. No pertinent family history.  Social History Social History   Tobacco Use   Smoking status: Former    Years: 4.00    Types: Cigarettes, Pipe, Cigars   Smokeless tobacco: Never   Tobacco comments:    quit smoking 1966  Substance Use Topics   Alcohol use: Yes    Alcohol/week: 2.0 standard drinks of alcohol    Types: 2 Cans of beer per week   Drug use: No    Current Outpatient Medications  Medication Sig Dispense Refill   acetaminophen (TYLENOL) 500 MG tablet Take 1,000 mg by mouth every 6 (six) hours as needed. pain      allopurinol (ZYLOPRIM) 300 MG tablet Take 300 mg by mouth daily.       apixaban (ELIQUIS) 5 MG TABS tablet TAKE 1 TABLET TWICE A DAY 180 tablet 3   atorvastatin (LIPITOR) 10 MG tablet TAKE 1 TABLET BY MOUTH EVERY DAY 90 tablet 3   chlorpheniramine (CHLOR-TRIMETON) 4 MG tablet Take 4 mg by mouth  2 (two) times daily as needed. allergies      clobetasol cream (TEMOVATE) 0.05 % Apply topically daily.     ketoconazole (NIZORAL) 2 % cream Apply topically daily.     metoprolol succinate (TOPROL-XL) 50 MG 24 hr tablet TAKE 1 TABLET BY MOUTH DAILY. TAKE WITH OR IMMEDIATELY FOLLOWING A MEAL. 90 tablet 2   multivitamin-lutein (OCUVITE-LUTEIN) CAPS Take 1 capsule by mouth daily.       triamcinolone cream (KENALOG) 0.1 % as needed.     valsartan (DIOVAN) 320 MG tablet Take 320 mg by mouth daily.     amLODipine (NORVASC) 10 MG tablet Take 1 tablet (10 mg total) by mouth daily. 90 tablet 3   No current facility-administered medications for this visit.    Allergies  Allergen Reactions   Codeine Itching   Sulfa Antibiotics Other (See Comments)    Reactions: nausea,  shaking of the body    Review of Systems  Constitutional:  Negative for fatigue.  HENT: Negative.    Eyes: Negative.   Respiratory:  Negative for shortness of breath.   Cardiovascular:  Positive for leg swelling. Negative for chest pain and palpitations.  Gastrointestinal: Negative.   Endocrine: Negative.   Genitourinary: Negative.   Musculoskeletal: Negative.   Skin: Negative.   Allergic/Immunologic: Negative.   Neurological: Negative.   Hematological: Negative.   Psychiatric/Behavioral: Negative.      BP (!) 154/89 (BP Location: Left Arm, Patient Position: Sitting)   Pulse 66   Resp 18   Ht '5\' 10"'$  (1.778 m)   Wt 192 lb (87.1 kg)   SpO2 94% Comment: RA  BMI 27.55 kg/m  Physical Exam Constitutional:      Appearance: Normal appearance. He is normal weight.  HENT:     Head: Normocephalic and atraumatic.  Eyes:     Extraocular Movements: Extraocular movements intact.     Conjunctiva/sclera: Conjunctivae normal.     Pupils: Pupils are equal, round, and reactive to light.  Neck:     Vascular: No carotid bruit.  Cardiovascular:     Rate and Rhythm: Normal rate and regular rhythm.     Pulses: Normal pulses.     Heart sounds: Normal heart sounds. No murmur heard. Pulmonary:     Effort: Pulmonary effort is normal.     Breath sounds: Normal breath sounds.  Abdominal:     General: There is no distension.     Tenderness: There is no abdominal tenderness.  Musculoskeletal:        General: No swelling.  Skin:    General: Skin is warm and dry.  Neurological:     General: No focal deficit present.     Mental Status: He is alert and oriented to person, place, and time.  Psychiatric:        Mood and Affect: Mood normal.        Behavior: Behavior normal.      Diagnostic Tests:  Narrative & Impression  CLINICAL DATA:  Aortic aneurysm, known or suspected.   EXAM: CT ANGIOGRAPHY CHEST WITH CONTRAST   TECHNIQUE: Multidetector CT imaging of the chest was performed  using the standard protocol during bolus administration of intravenous contrast. Multiplanar CT image reconstructions and MIPs were obtained to evaluate the vascular anatomy.   RADIATION DOSE REDUCTION: This exam was performed according to the departmental dose-optimization program which includes automated exposure control, adjustment of the mA and/or kV according to patient size and/or use of iterative reconstruction technique.   CONTRAST:  163m  OMNIPAQUE IOHEXOL 350 MG/ML SOLN   COMPARISON:  Ultrasound of the abdomen complete on 02/05/2016   FINDINGS: Cardiovascular: The heart is enlarged. There is marked coronary artery calcification. The pulmonary artery is enlarged, 4.6 centimeters and consistent with pulmonary arterial hypertension. The ascending aorta is 4.2 centimeters. There are focal calcifications within the thoracic aortic arch and descending aorta. Proximal descending aorta is 3.6 centimeters. Aorta is tortuous.   Mediastinum/Nodes: Esophagus is unremarkable. The visualized portion of the thyroid gland has a normal appearance. No significant mediastinal, hilar, or axillary adenopathy.   Lungs/Pleura: There is diffuse bilateral septal thickening with a dependent gradient. Scattered areas of air trapping are also noted. There are no focal consolidations. No pleural effusions. No suspicious pulmonary nodules. Airways are patent.   Upper Abdomen: Liver is nodular and rounded consistent with cirrhosis. Gallbladder is present.   Musculoskeletal: Moderate degenerative changes throughout the thoracic spine. No acute fracture.   Review of the MIP images confirms the above findings.   IMPRESSION: 1. Cardiomegaly and coronary artery disease. 2. Ascending aortic diameter of 4.6 centimeters. Ascending thoracic aortic aneurysm. Recommend semi-annual imaging followup by CTA or MRA and referral to cardiothoracic surgery if not already obtained. This recommendation follows  2010 ACCF/AHA/AATS/ACR/ASA/SCA/SCAI/SIR/STS/SVM Guidelines for the Diagnosis and Management of Patients With Thoracic Aortic Disease. Circulation. 2010; 121: E751-Z001. Aortic aneurysm NOS (ICD10-I71.9) 3.  Aortic Atherosclerosis (ICD10-I70.0). 4.  Aortic aneurysm NOS (ICD10-I71.9). 5. Pulmonary arterial hypertension. 6. Findings consistent with interstitial pulmonary edema. 7. Cirrhotic contour of the liver.     Electronically Signed   By: Nolon Nations M.D.   On: 10/09/2021 10:19   Impression:  This 81 year old gentleman has a stable 4.2 cm fusiform ascending aortic aneurysm.  I think there was some confusion about his aneurysm enlarging because the radiologist measured the aorta at 4.2 cm and the pulmonary artery at 4.6 cm and then dictated in his impression that the aorta was 4.6 cm.  I compared  his most recent CTA of the chest to his previous cardiac MRI in 2021 and I do not think there has been any change in the aortic aneurysm.  His aneurysm is still well below the surgical threshold of 5.5 cm in a patient with a trileaflet aortic valve.  I reviewed the CTA images with him and answered all of his questions.  I stressed the importance of continued good blood pressure control in preventing further enlargement and acute aortic dissection.  I advised him against doing any strenuous lifting that may require a Valsalva maneuver and could suddenly raise his blood pressure to high levels.  Plan:  I will see him back in 1 year with a CTA of the chest.  I spent 40 minutes performing this consultation and > 50% of this time was spent face to face counseling and coordinating the care of this patient's ascending aortic aneurysm.   Gaye Pollack, MD [facility] [phone]

## 2021-12-10 DIAGNOSIS — Z08 Encounter for follow-up examination after completed treatment for malignant neoplasm: Secondary | ICD-10-CM | POA: Diagnosis not present

## 2021-12-10 DIAGNOSIS — D2371 Other benign neoplasm of skin of right lower limb, including hip: Secondary | ICD-10-CM | POA: Diagnosis not present

## 2021-12-10 DIAGNOSIS — D1801 Hemangioma of skin and subcutaneous tissue: Secondary | ICD-10-CM | POA: Diagnosis not present

## 2021-12-10 DIAGNOSIS — Z85828 Personal history of other malignant neoplasm of skin: Secondary | ICD-10-CM | POA: Diagnosis not present

## 2021-12-10 DIAGNOSIS — L570 Actinic keratosis: Secondary | ICD-10-CM | POA: Diagnosis not present

## 2021-12-10 DIAGNOSIS — L130 Dermatitis herpetiformis: Secondary | ICD-10-CM | POA: Diagnosis not present

## 2021-12-10 DIAGNOSIS — L814 Other melanin hyperpigmentation: Secondary | ICD-10-CM | POA: Diagnosis not present

## 2021-12-10 DIAGNOSIS — L821 Other seborrheic keratosis: Secondary | ICD-10-CM | POA: Diagnosis not present

## 2021-12-28 ENCOUNTER — Ambulatory Visit: Payer: Medicare Other | Attending: Internal Medicine | Admitting: Nurse Practitioner

## 2021-12-28 DIAGNOSIS — Z01810 Encounter for preprocedural cardiovascular examination: Secondary | ICD-10-CM | POA: Insufficient documentation

## 2021-12-28 NOTE — Progress Notes (Signed)
Virtual Visit via Telephone Note   Because of Randy Evans's co-morbid illnesses, he is at least at moderate risk for complications without adequate follow up.  This format is felt to be most appropriate for this patient at this time.  The patient did not have access to video technology/had technical difficulties with video requiring transitioning to audio format only (telephone).  All issues noted in this document were discussed and addressed.  No physical exam could be performed with this format.  Please refer to the patient's chart for his consent to telehealth for [facility].  Evaluation Performed:  Preoperative cardiovascular risk assessment _____________   Date:  12/28/2021   Patient ID:  Randy Evans, DOB 21-Jun-1940, MRN [id number] Patient Location:  Home Provider location:   Office  Primary Care Provider:  Lujean Amel, MD Primary Cardiologist:  Sinclair Grooms, MD  Chief Complaint / Patient Profile   81 y.o. y/o male with a h/o atrial fibrillation, paroxysmal atypical atrial flutter, hypertension, ascending aorta aneurysm, cerebral aneurysm s/p coil embolization, hemochromatosis, and gout who is pending colonoscopy on 01/12/2022 with Dr. Alessandra Bevels of [facility] and presents today for telephonic preoperative cardiovascular risk assessment.  Past Medical History    Past Medical History:  Diagnosis Date   Atrial fibrillation ([place])    Onset 1991 in [place]   Cerebral aneurysm without rupture    treated with coil embolization 2006   Gout    Hemochromatosis    Has seen Teena Irani   Hypertension    Past Surgical History:  Procedure Laterality Date   ANEURYSM COILING  2006   bone spur removal  1951   below left knee   BRAIN SURGERY     CARDIAC ELECTROPHYSIOLOGY [place]  01/2008; 07/2008   CARDIOVERSION  2000; 2008 03/2008; ;02/15/2011   2008; 2009 2012:    Surgeon: Kerry Hough., MD;  Location: [place];  Service: Cardiovascular;   Laterality: N/A;   CARDIOVERSION  08/05/2011   Procedure: CARDIOVERSION;  Surgeon: Jacolyn Reedy, MD;  Location: [place];  Service: Cardiovascular;  Laterality: N/A;   CATARACT EXTRACTION W/ INTRAOCULAR LENS  IMPLANT, BILATERAL  2009-2010   IR ANGIO INTRA EXTRACRAN SEL COM CAROTID INNOMINATE UNI L MOD SED  11/09/2016   IR ANGIO INTRA EXTRACRAN SEL COM CAROTID INNOMINATE UNI R MOD SED  07/17/2019   IR ANGIO INTRA EXTRACRAN SEL INTERNAL CAROTID UNI R MOD SED  11/09/2016   IR US GUIDE VASC ACCESS RIGHT  07/17/2019   LIVER BIOPSY  late 1990's   TOE SURGERY      Allergies  Allergies  Allergen Reactions   Codeine Itching   Sulfa Antibiotics Other (See Comments)    Reactions: nausea, shaking of the body    History of Present Illness    Randy Evans is a 81 y.o. male who presents via audio/video conferencing for a telehealth visit today.  Pt was last seen in cardiology clinic on 09/28/2021 by Dr. Tamala Julian.  At that time Randy Evans was doing well.  The patient is now pending procedure as outlined above. Since his last visit, he has done well from a cardiac standpoint. He denies chest pain, palpitations, dyspnea, pnd, orthopnea, n, v, dizziness, syncope, edema, weight gain, or early satiety. All other systems reviewed and are otherwise negative except as noted above.   Home Medications    Prior to Admission medications   Medication Sig Start Date End Date Taking? Authorizing Provider  acetaminophen (TYLENOL) 500 MG tablet Take 1,000 mg by mouth every 6 (six) hours as needed. pain     [provider]  allopurinol (ZYLOPRIM) 300 MG tablet Take 300 mg by mouth daily.      [provider]  amLODipine (NORVASC) 10 MG tablet Take 1 tablet (10 mg total) by mouth daily. 05/26/21 12/08/21  Evans Lance, MD  apixaban Arne Cleveland) 5 MG TABS tablet TAKE 1 TABLET TWICE A DAY 07/08/21   Evans Lance, MD  atorvastatin (LIPITOR) 10 MG tablet TAKE 1 TABLET BY MOUTH EVERY DAY 11/16/21   Belva Crome, MD  chlorpheniramine (CHLOR-TRIMETON) 4 MG tablet Take 4 mg by mouth 2 (two) times daily as needed. allergies     [provider]  clobetasol cream (TEMOVATE) 0.05 % Apply topically daily. 09/03/21   [provider]  ketoconazole (NIZORAL) 2 % cream Apply topically daily. 06/16/21   [provider]  metoprolol succinate (TOPROL-XL) 50 MG 24 hr tablet TAKE 1 TABLET BY MOUTH DAILY. TAKE WITH OR IMMEDIATELY FOLLOWING A MEAL. 08/07/21   Belva Crome, MD  multivitamin-lutein [facility]) CAPS Take 1 capsule by mouth daily.      [provider]  triamcinolone cream (KENALOG) 0.1 % as needed. 06/11/21   [provider]  valsartan (DIOVAN) 320 MG tablet Take 320 mg by mouth daily. 07/27/20   [provider]    Physical Exam    Vital Signs:  Randy Evans does not have vital signs available for review today.  Given telephonic nature of communication, physical exam is limited. AAOx3. NAD. Normal affect.  Speech and respirations are unlabored.  Accessory Clinical Findings    None  Assessment & Plan    1.  Preoperative Cardiovascular Risk Assessment:  According to the Revised Cardiac Risk Index (RCRI), his Perioperative Risk of Major Cardiac Event is (%): 0.9. His Functional Capacity in METs is: 7.34 according to the Duke Activity Status Index (DASI).Therefore, based on ACC/AHA guidelines, patient would be at acceptable risk for the planned procedure without further cardiovascular testing.   The patient was advised that if he develops new symptoms prior to surgery to contact our office to arrange for a follow-up visit, and he verbalized understanding.  Per office protocol, patient can hold Eliquis for 1-2 days prior to procedure.   Patient will not need bridging with Lovenox (enoxaparin) around procedure.    A copy of this note will be routed to requesting surgeon.  Time:   Today, I have spent 8 minutes with the patient with  telehealth technology discussing medical history, symptoms, and management plan.     Lenna Sciara, NP  12/28/2021, 2:53 PM

## 2022-01-12 DIAGNOSIS — R194 Change in bowel habit: Secondary | ICD-10-CM | POA: Diagnosis not present

## 2022-01-12 DIAGNOSIS — D124 Benign neoplasm of descending colon: Secondary | ICD-10-CM | POA: Diagnosis not present

## 2022-01-12 DIAGNOSIS — R197 Diarrhea, unspecified: Secondary | ICD-10-CM | POA: Diagnosis not present

## 2022-01-12 DIAGNOSIS — D122 Benign neoplasm of ascending colon: Secondary | ICD-10-CM | POA: Diagnosis not present

## 2022-01-12 DIAGNOSIS — D120 Benign neoplasm of cecum: Secondary | ICD-10-CM | POA: Diagnosis not present

## 2022-01-12 DIAGNOSIS — K648 Other hemorrhoids: Secondary | ICD-10-CM | POA: Diagnosis not present

## 2022-01-14 DIAGNOSIS — D120 Benign neoplasm of cecum: Secondary | ICD-10-CM | POA: Diagnosis not present

## 2022-01-14 DIAGNOSIS — D124 Benign neoplasm of descending colon: Secondary | ICD-10-CM | POA: Diagnosis not present

## 2022-01-14 DIAGNOSIS — D122 Benign neoplasm of ascending colon: Secondary | ICD-10-CM | POA: Diagnosis not present

## 2022-01-26 DIAGNOSIS — Z23 Encounter for immunization: Secondary | ICD-10-CM | POA: Diagnosis not present

## 2022-02-01 DIAGNOSIS — I714 Abdominal aortic aneurysm, without rupture, unspecified: Secondary | ICD-10-CM | POA: Diagnosis not present

## 2022-02-01 DIAGNOSIS — Z8679 Personal history of other diseases of the circulatory system: Secondary | ICD-10-CM | POA: Diagnosis not present

## 2022-02-01 DIAGNOSIS — R159 Full incontinence of feces: Secondary | ICD-10-CM | POA: Diagnosis not present

## 2022-04-26 DIAGNOSIS — B353 Tinea pedis: Secondary | ICD-10-CM | POA: Diagnosis not present

## 2022-04-26 DIAGNOSIS — M71341 Other bursal cyst, right hand: Secondary | ICD-10-CM | POA: Diagnosis not present

## 2022-04-26 DIAGNOSIS — L300 Nummular dermatitis: Secondary | ICD-10-CM | POA: Diagnosis not present

## 2022-04-29 ENCOUNTER — Other Ambulatory Visit: Payer: Self-pay | Admitting: Interventional Cardiology

## 2022-06-02 DIAGNOSIS — M109 Gout, unspecified: Secondary | ICD-10-CM | POA: Diagnosis not present

## 2022-06-02 DIAGNOSIS — Z0001 Encounter for general adult medical examination with abnormal findings: Secondary | ICD-10-CM | POA: Diagnosis not present

## 2022-06-02 DIAGNOSIS — E7800 Pure hypercholesterolemia, unspecified: Secondary | ICD-10-CM | POA: Diagnosis not present

## 2022-06-02 DIAGNOSIS — I10 Essential (primary) hypertension: Secondary | ICD-10-CM | POA: Diagnosis not present

## 2022-06-02 DIAGNOSIS — N5201 Erectile dysfunction due to arterial insufficiency: Secondary | ICD-10-CM | POA: Diagnosis not present

## 2022-06-02 DIAGNOSIS — E1169 Type 2 diabetes mellitus with other specified complication: Secondary | ICD-10-CM | POA: Diagnosis not present

## 2022-06-02 DIAGNOSIS — I7121 Aneurysm of the ascending aorta, without rupture: Secondary | ICD-10-CM | POA: Diagnosis not present

## 2022-06-02 DIAGNOSIS — Z79899 Other long term (current) drug therapy: Secondary | ICD-10-CM | POA: Diagnosis not present

## 2022-06-02 DIAGNOSIS — I4892 Unspecified atrial flutter: Secondary | ICD-10-CM | POA: Diagnosis not present

## 2022-06-02 DIAGNOSIS — I700 Atherosclerosis of aorta: Secondary | ICD-10-CM | POA: Diagnosis not present

## 2022-06-02 DIAGNOSIS — I671 Cerebral aneurysm, nonruptured: Secondary | ICD-10-CM | POA: Diagnosis not present

## 2022-06-03 ENCOUNTER — Ambulatory Visit: Payer: Medicare Other | Attending: Internal Medicine | Admitting: Internal Medicine

## 2022-06-03 ENCOUNTER — Encounter: Payer: Self-pay | Admitting: Internal Medicine

## 2022-06-03 VITALS — BP 154/88 | HR 59 | Ht 70.0 in | Wt 195.4 lb

## 2022-06-03 DIAGNOSIS — I10 Essential (primary) hypertension: Secondary | ICD-10-CM | POA: Diagnosis not present

## 2022-06-03 DIAGNOSIS — I4891 Unspecified atrial fibrillation: Secondary | ICD-10-CM | POA: Diagnosis not present

## 2022-06-03 NOTE — Patient Instructions (Addendum)
Medication Instructions:  Your physician recommends that you continue on your current medications as directed. Please refer to the Current Medication list given to you today.  *If you need a refill on your cardiac medications before your next appointment, please call your pharmacy*  Lab Work: None ordered.  If you have labs (blood work) drawn today and your tests are completely normal, you will receive your results only by: [place] (if you have MyChart) OR A paper copy in the mail If you have any lab test that is abnormal or we need to change your treatment, we will call you to review the results.  Testing/Procedures: None ordered.  Follow-Up:   Referral to Dr. Gardiner Rhyme, CHRISTOPHER L, has been placed for General Cardiology.  At [facility], you and your health needs are our priority.  As part of our continuing mission to provide you with exceptional heart care, we have created designated Provider Care Teams.  These Care Teams include your primary Cardiologist (physician) and Advanced Practice Providers (APPs -  Physician Assistants and Nurse Practitioners) who all work together to provide you with the care you need, when you need it.  We recommend signing up for the patient portal called "MyChart".  Sign up information is provided on this After Visit Summary.  MyChart is used to connect with patients for Virtual Visits (Telemedicine).  Patients are able to view lab/test results, encounter notes, upcoming appointments, etc.  Non-urgent messages can be sent to your provider as well.   To learn more about what you can do with MyChart, go to [email or web address].    Your next appointment:   1 year(s)  The format for your next appointment:   In Person  Provider:   Cristopher Peru, MD{or one of the following Advanced Practice Providers on your designated Care Team:   Tommye Standard, [place] Legrand Como "[facility]" [place], [place]

## 2022-06-03 NOTE — Progress Notes (Signed)
HPI Randy Evans returns today for followup of his atrial fib. He is a pleasant 82 yo man with a h/o PAF s/p multiple ablations, who has been off of AA drug therapy for over a year. He feels well and has not had much if any symptomatic atrial fib. He denies chest pain, sob, or syncope. His bp is always a little on the high side, though not as bad as it tends to be in the office.He has avoided the hospital.  Allergies  Allergen Reactions   Codeine Itching   Sulfa Antibiotics Other (See Comments)    Reactions: nausea, shaking of the body     Current Outpatient Medications  Medication Sig Dispense Refill   acetaminophen (TYLENOL) 500 MG tablet Take 1,000 mg by mouth every 6 (six) hours as needed. pain      allopurinol (ZYLOPRIM) 300 MG tablet Take 300 mg by mouth daily.       apixaban (ELIQUIS) 5 MG TABS tablet TAKE 1 TABLET TWICE A DAY 180 tablet 3   atorvastatin (LIPITOR) 10 MG tablet TAKE 1 TABLET BY MOUTH EVERY DAY 90 tablet 3   chlorpheniramine (CHLOR-TRIMETON) 4 MG tablet Take 4 mg by mouth 2 (two) times daily as needed. allergies      clobetasol cream (TEMOVATE) 0.05 % Apply topically daily.     ketoconazole (NIZORAL) 2 % cream Apply topically daily.     metoprolol succinate (TOPROL-XL) 50 MG 24 hr tablet TAKE 1 TABLET BY MOUTH EVERY DAY WITH OR IMMEDIATELY FOLLOWING A MEAL 90 tablet 2   multivitamin-lutein (OCUVITE-LUTEIN) CAPS Take 1 capsule by mouth daily.       triamcinolone cream (KENALOG) 0.1 % as needed.     valsartan (DIOVAN) 320 MG tablet Take 320 mg by mouth daily.     amLODipine (NORVASC) 10 MG tablet Take 1 tablet (10 mg total) by mouth daily. 90 tablet 3   No current facility-administered medications for this visit.     Past Medical History:  Diagnosis Date   Atrial fibrillation ([place])    Onset 1991 in [place]   Cerebral aneurysm without rupture    treated with coil embolization 2006   Gout    Hemochromatosis    Has seen Teena Irani   Hypertension      ROS:   All systems reviewed and negative except as noted in the HPI.   Past Surgical History:  Procedure Laterality Date   ANEURYSM COILING  2006   bone spur removal  1951   below left knee   BRAIN SURGERY     CARDIAC ELECTROPHYSIOLOGY MAPPING AND ABLATION  01/2008; 07/2008   CARDIOVERSION  2000; 2008 03/2008; ;02/15/2011   2008; 2009 2012:    Surgeon: Kerry Hough., MD;  Location: [place];  Service: Cardiovascular;  Laterality: N/A;   CARDIOVERSION  08/05/2011   Procedure: CARDIOVERSION;  Surgeon: Jacolyn Reedy, MD;  Location: [place];  Service: Cardiovascular;  Laterality: N/A;   CATARACT EXTRACTION W/ INTRAOCULAR LENS  IMPLANT, BILATERAL  2009-2010   IR ANGIO INTRA EXTRACRAN SEL COM CAROTID INNOMINATE UNI L MOD SED  11/09/2016   IR ANGIO INTRA EXTRACRAN SEL COM CAROTID INNOMINATE UNI R MOD SED  07/17/2019   IR ANGIO INTRA EXTRACRAN SEL INTERNAL CAROTID UNI R MOD SED  11/09/2016   IR US GUIDE VASC ACCESS RIGHT  07/17/2019   LIVER BIOPSY  late 1990's   TOE SURGERY       History reviewed. No pertinent  family history.   Social History   Socioeconomic History   Marital status: Married    Spouse name: Not on file   Number of children: Not on file   Years of education: Not on file   Highest education level: Not on file  Occupational History   Not on file  Tobacco Use   Smoking status: Former    Years: 4.00    Types: Cigarettes, Pipe, Cigars   Smokeless tobacco: Never   Tobacco comments:    quit smoking 1966  Substance and Sexual Activity   Alcohol use: Yes    Alcohol/week: 2.0 standard drinks of alcohol    Types: 2 Cans of beer per week   Drug use: No   Sexual activity: Yes  Other Topics Concern   Not on file  Social History Narrative   Retired from [employer].  Moved from [place]. Graduate of [employer].  Married.   Social Determinants of Health   Financial Resource Strain: Not on file  Food Insecurity: Not on file  Transportation Needs: Not on file  Physical  Activity: Not on file  Stress: Not on file  Social Connections: Not on file  Intimate Partner Violence: Not on file     BP (!) 154/88   Pulse (!) 59   Ht '5\' 10"'$  (1.778 m)   Wt 195 lb 6.4 oz (88.6 kg)   SpO2 96%   BMI 28.04 kg/m   Physical Exam:  Well appearing NAD HEENT: Unremarkable Neck:  No JVD, no thyromegally Lymphatics:  No adenopathy Back:  No CVA tenderness Lungs:  Clear with no wheezes HEART:  Regular rate rhythm, no murmurs, no rubs, no clicks Abd:  soft, positive bowel sounds, no organomegally, no rebound, no guarding Ext:  2 plus pulses, no edema, no cyanosis, no clubbing Skin:  No rashes no nodules Neuro:  CN II through XII intact, motor grossly intact  EKG - NSR with RBBB and left axis  Assess/Plan: PAF - he is mostly maintaining NSR. He will continue his beta blocker and systemic anti-coagulation.  CAD - he denies anginal symptoms. Dyslipidemia- he will continue lipitor HTN - his bp is up a bit in the office. He will continue diovan and norvasc and toprol.  Randy Overlie Mellanie Bejarano,MD

## 2022-06-17 ENCOUNTER — Other Ambulatory Visit: Payer: Self-pay | Admitting: *Deleted

## 2022-06-17 DIAGNOSIS — I480 Paroxysmal atrial fibrillation: Secondary | ICD-10-CM

## 2022-06-17 MED ORDER — APIXABAN 5 MG PO TABS: 5.0000 mg | ORAL_TABLET | Freq: Two times a day (BID) | ORAL | 1 refills | Status: DC

## 2022-06-17 NOTE — Telephone Encounter (Signed)
Eliquis '5mg'$  refill request received. Patient is 82 years old, weight-88.6kg, Crea-0.75 on 10/05/21, Diagnosis-Afib, and last seen by Dr. Lovena Le on 06/03/22. Dose is appropriate based on dosing criteria. Will send in refill to requested pharmacy.

## 2022-06-18 ENCOUNTER — Other Ambulatory Visit: Payer: Self-pay

## 2022-06-18 MED ORDER — AMLODIPINE BESYLATE 10 MG PO TABS: 10.0000 mg | ORAL_TABLET | Freq: Every day | ORAL | 3 refills | Status: DC

## 2022-08-23 ENCOUNTER — Other Ambulatory Visit: Payer: Self-pay | Admitting: Neurosurgery

## 2022-08-23 DIAGNOSIS — I671 Cerebral aneurysm, nonruptured: Secondary | ICD-10-CM

## 2022-09-01 DIAGNOSIS — Z961 Presence of intraocular lens: Secondary | ICD-10-CM | POA: Diagnosis not present

## 2022-09-01 DIAGNOSIS — H52203 Unspecified astigmatism, bilateral: Secondary | ICD-10-CM | POA: Diagnosis not present

## 2022-09-01 DIAGNOSIS — H353131 Nonexudative age-related macular degeneration, bilateral, early dry stage: Secondary | ICD-10-CM | POA: Diagnosis not present

## 2022-09-01 DIAGNOSIS — H35373 Puckering of macula, bilateral: Secondary | ICD-10-CM | POA: Diagnosis not present

## 2022-09-14 DIAGNOSIS — M5432 Sciatica, left side: Secondary | ICD-10-CM | POA: Diagnosis not present

## 2022-09-14 DIAGNOSIS — E1169 Type 2 diabetes mellitus with other specified complication: Secondary | ICD-10-CM | POA: Diagnosis not present

## 2022-09-27 DIAGNOSIS — M5416 Radiculopathy, lumbar region: Secondary | ICD-10-CM | POA: Diagnosis not present

## 2022-09-27 DIAGNOSIS — M545 Low back pain, unspecified: Secondary | ICD-10-CM | POA: Diagnosis not present

## 2022-09-30 DIAGNOSIS — M5416 Radiculopathy, lumbar region: Secondary | ICD-10-CM | POA: Diagnosis not present

## 2022-09-30 DIAGNOSIS — M545 Low back pain, unspecified: Secondary | ICD-10-CM | POA: Diagnosis not present

## 2022-10-04 ENCOUNTER — Ambulatory Visit
Admission: RE | Admit: 2022-10-04 | Discharge: 2022-10-04 | Disposition: A | Discharge status: Home / Self Care | Payer: Medicare Other | Source: Ambulatory Visit | Attending: Neurosurgery | Admitting: Neurosurgery

## 2022-10-04 DIAGNOSIS — I671 Cerebral aneurysm, nonruptured: Secondary | ICD-10-CM

## 2022-10-04 DIAGNOSIS — M545 Low back pain, unspecified: Secondary | ICD-10-CM | POA: Diagnosis not present

## 2022-10-04 DIAGNOSIS — M5416 Radiculopathy, lumbar region: Secondary | ICD-10-CM | POA: Diagnosis not present

## 2022-10-06 DIAGNOSIS — M545 Low back pain, unspecified: Secondary | ICD-10-CM | POA: Diagnosis not present

## 2022-10-06 DIAGNOSIS — M5416 Radiculopathy, lumbar region: Secondary | ICD-10-CM | POA: Diagnosis not present

## 2022-11-12 ENCOUNTER — Other Ambulatory Visit: Payer: Self-pay

## 2022-11-12 MED ORDER — ATORVASTATIN CALCIUM 10 MG PO TABS: 10.0000 mg | ORAL_TABLET | Freq: Every day | ORAL | 0 refills | Status: DC

## 2022-12-01 DIAGNOSIS — E1169 Type 2 diabetes mellitus with other specified complication: Secondary | ICD-10-CM | POA: Diagnosis not present

## 2022-12-01 DIAGNOSIS — I10 Essential (primary) hypertension: Secondary | ICD-10-CM | POA: Diagnosis not present

## 2022-12-01 DIAGNOSIS — Z79899 Other long term (current) drug therapy: Secondary | ICD-10-CM | POA: Diagnosis not present

## 2022-12-16 DIAGNOSIS — L814 Other melanin hyperpigmentation: Secondary | ICD-10-CM | POA: Diagnosis not present

## 2022-12-16 DIAGNOSIS — L821 Other seborrheic keratosis: Secondary | ICD-10-CM | POA: Diagnosis not present

## 2022-12-16 DIAGNOSIS — D225 Melanocytic nevi of trunk: Secondary | ICD-10-CM | POA: Diagnosis not present

## 2022-12-16 DIAGNOSIS — Z08 Encounter for follow-up examination after completed treatment for malignant neoplasm: Secondary | ICD-10-CM | POA: Diagnosis not present

## 2022-12-16 DIAGNOSIS — Z85828 Personal history of other malignant neoplasm of skin: Secondary | ICD-10-CM | POA: Diagnosis not present

## 2022-12-16 DIAGNOSIS — D485 Neoplasm of uncertain behavior of skin: Secondary | ICD-10-CM | POA: Diagnosis not present

## 2022-12-16 DIAGNOSIS — L570 Actinic keratosis: Secondary | ICD-10-CM | POA: Diagnosis not present

## 2022-12-21 ENCOUNTER — Ambulatory Visit: Payer: Medicare Other | Admitting: Cardiology

## 2022-12-27 DIAGNOSIS — Z23 Encounter for immunization: Secondary | ICD-10-CM | POA: Diagnosis not present

## 2022-12-28 ENCOUNTER — Other Ambulatory Visit: Payer: Self-pay | Admitting: Internal Medicine

## 2022-12-28 DIAGNOSIS — I480 Paroxysmal atrial fibrillation: Secondary | ICD-10-CM

## 2022-12-28 NOTE — Telephone Encounter (Signed)
Prescription refill request for Eliquis received. Indication: Afib  Last office visit: 06/03/22 Ladona Ridgel)  Scr: 0.75 (10/05/21)  Age: 82 Weight: 88.6kg  Appropriate dose. Refill sent.

## 2023-01-04 NOTE — Progress Notes (Unsigned)
Cardiology Office Note:    Date:  01/05/2023   ID:  JOELLE FLESSNER, DOB 1940-11-23, MRN [id number]  PCP:  Darrow Bussing, MD  Cardiologist:  Lesleigh Noe, MD (Inactive)  Electrophysiologist:  Lewayne Bunting, MD   Referring MD: Marinus Maw, MD   Chief Complaint  Patient presents with   Follow-up   Atrial Fibrillation   Edema    LEGS    History of Present Illness:    Randy Evans is a 82 y.o. male with a hx of atrial fibrillation/flutter, ascending aortic aneurysm, cerebral aneurysm status post coil embolization, hemochromatosis, gout who presents for follow-up.  Previously followed with Dr. Katrinka Blazing, last seen 09/28/2021.  He reports he has been doing well, denies any A-fib recently.  Denies any chest pain, dyspnea, lightheadedness, syncope, or palpitations.  He denies any bleeding.  Does report some swelling in his legs since increasing his amlodipine.  He is not checking his blood pressure at home.  He has not been exercising at home.  Wt Readings from Last 3 Encounters:  01/05/23 202 lb (91.6 kg)  06/03/22 195 lb 6.4 oz (88.6 kg)  12/09/21 192 lb (87.1 kg)     Past Medical History:  Diagnosis Date   Atrial fibrillation ([facility])    Onset 1991 in [place]   Cerebral aneurysm without rupture    treated with coil embolization 2006   Gout    Hemochromatosis    Has seen Dorena Cookey   Hypertension     Past Surgical History:  Procedure Laterality Date   ANEURYSM COILING  2006   bone spur removal  1951   below left knee   BRAIN SURGERY     CARDIAC ELECTROPHYSIOLOGY MAPPING AND ABLATION  01/2008; 07/2008   CARDIOVERSION  2000; 2008 03/2008; ;02/15/2011   2008; 2009 2012:    Surgeon: Darden Palmer., MD;  Location: [facility] OR;  Service: Cardiovascular;  Laterality: N/A;   CARDIOVERSION  08/05/2011   Procedure: CARDIOVERSION;  Surgeon: Othella Boyer, MD;  Location: [facility] OR;  Service: Cardiovascular;  Laterality: N/A;   CATARACT EXTRACTION W/ INTRAOCULAR LENS  IMPLANT,  BILATERAL  2009-2010   IR ANGIO INTRA EXTRACRAN SEL COM CAROTID INNOMINATE UNI L MOD SED  11/09/2016   IR ANGIO INTRA EXTRACRAN SEL COM CAROTID INNOMINATE UNI R MOD SED  07/17/2019   IR ANGIO INTRA EXTRACRAN SEL INTERNAL CAROTID UNI R MOD SED  11/09/2016   IR US GUIDE VASC ACCESS RIGHT  07/17/2019   LIVER BIOPSY  late 1990's   TOE SURGERY      Current Medications: Current Meds  Medication Sig   acetaminophen (TYLENOL) 500 MG tablet Take 1,000 mg by mouth every 6 (six) hours as needed. pain    allopurinol (ZYLOPRIM) 300 MG tablet Take 300 mg by mouth daily.     amLODipine (NORVASC) 10 MG tablet Take 1 tablet (10 mg total) by mouth daily.   Ascorbic Acid (VITAMIN C PO) Take 1 tablet by mouth daily.   atorvastatin (LIPITOR) 10 MG tablet Take 1 tablet (10 mg total) by mouth daily.   chlorpheniramine (CHLOR-TRIMETON) 4 MG tablet Take 4 mg by mouth 2 (two) times daily as needed. allergies    clobetasol cream (TEMOVATE) 0.05 % Apply topically daily.   ELIQUIS 5 MG TABS tablet TAKE 1 TABLET TWICE A DAY   ketoconazole (NIZORAL) 2 % cream Apply topically daily.   metoprolol succinate (TOPROL-XL) 50 MG 24 hr tablet TAKE 1 TABLET BY MOUTH EVERY  DAY WITH OR IMMEDIATELY FOLLOWING A MEAL   multivitamin-lutein (OCUVITE-LUTEIN) CAPS Take 1 capsule by mouth daily.     Omega-3 Fatty Acids (FISH OIL PO) Take 1 Capful by mouth daily.   triamcinolone cream (KENALOG) 0.1 % as needed.   valsartan (DIOVAN) 320 MG tablet Take 320 mg by mouth daily.   VITAMIN D, ERGOCALCIFEROL, PO Take 1 Capful by mouth daily.     Allergies:   Codeine and Sulfa antibiotics   Social History   Socioeconomic History   Marital status: Married    Spouse name: Not on file   Number of children: Not on file   Years of education: Not on file   Highest education level: Not on file  Occupational History   Not on file  Tobacco Use   Smoking status: Former    Types: Cigarettes, Pipe, Cigars   Smokeless tobacco: Never   Tobacco  comments:    quit smoking 1966  Substance and Sexual Activity   Alcohol use: Yes    Alcohol/week: 2.0 standard drinks of alcohol    Types: 2 Cans of beer per week   Drug use: No   Sexual activity: Yes  Other Topics Concern   Not on file  Social History Narrative   Retired from [employer].  Moved from [place]. Graduate of [employer].  Married.   Social Determinants of Health   Financial Resource Strain: Not on file  Food Insecurity: Not on file  Transportation Needs: Not on file  Physical Activity: Not on file  Stress: Not on file  Social Connections: Not on file     Family History: The patient's family history is not on file.  ROS:   Please see the history of present illness.     All other systems reviewed and are negative.  EKGs/Labs/Other Studies Reviewed:    The following studies were reviewed today:   EKG:   01/05/2023: Sinus rhythm with first-degree AV block, rate 60, right bundle branch block, left anterior fascicular block  Recent Labs: No results found for requested labs within last 365 days.  Recent Lipid Panel No results found for: "CHOL", "TRIG", "HDL", "CHOLHDL", "VLDL", "LDLCALC", "LDLDIRECT"  Physical Exam:    VS:  BP 138/80 (BP Location: Left Arm, Patient Position: Sitting, Cuff Size: Normal)   Pulse 60   Ht 5\' 9"  (1.753 m)   Wt 202 lb (91.6 kg)   BMI 29.83 kg/m     Wt Readings from Last 3 Encounters:  01/05/23 202 lb (91.6 kg)  06/03/22 195 lb 6.4 oz (88.6 kg)  12/09/21 192 lb (87.1 kg)     GEN:  Well nourished, well developed in no acute distress HEENT: Normal NECK: No JVD; No carotid bruits LYMPHATICS: No lymphadenopathy CARDIAC: RRR, no murmurs, rubs, gallops RESPIRATORY:  Clear to auscultation without rales, wheezing or rhonchi  ABDOMEN: Soft, non-tender, non-distended MUSCULOSKELETAL: Trace edema SKIN: Warm and dry NEUROLOGIC:  Alert and oriented x 3 PSYCHIATRIC:  Normal affect   ASSESSMENT:    1. Paroxysmal atrial fibrillation (HCC)    2. Aneurysm of ascending aorta without rupture (HCC)   3. Hemochromatosis, unspecified hemochromatosis type   4. Essential hypertension   5. Hyperlipidemia, unspecified hyperlipidemia type   6. Coronary artery disease involving native coronary artery of native heart without angina pectoris    PLAN:    Paroxysmal atrial fibrillation: Follows with EP.  Status post multiple ablations.  Previously has been on Tikosyn but off antiarrhythmic therapy for over a year.  Echocardiogram  04/2018 showed EF 50 to 55%, grade 2 diastolic dysfunction, ascending aortic dilatation measuring 43 mm -Continue Eliquis 5 mg twice daily -Continue Toprol-XL 50 mg daily  Aortic aneurysm: Measured 43 mm and ascending aorta on echo 04/2019.  CTA chest 10/2021 showed ascending aortic aneurysm measuring 46 mm -Follow-up with MRA chest  CAD: Cardiac MRI 08/2019 showed subendocardial LGE consistent with prior infarct in basal to mid inferolateral wall and basal anterolateral wall.  Overall EF 66%.  Denies anginal symptoms -Continue Eliquis 5 mg twice daily -Continue atorvastatin 10 mg daily  Hypertension: On amlodipine 10 mg daily and Toprol-XL 50 mg daily  Hyperlipidemia: On atorvastatin 10 mg daily.  LDL 31 on 05/25/2022  Hemochromatosis: previously required weekly phlebotomy now only receiving rarely.  Reports diagnosis was confirmed with genetic testing in [place].  Recommend referral to hematology.  Recommend cardiac MRI with T2* mapping to evaluate for hemochromatosis.  Will check iron studies, CBC  Lower extremity edema: Suspect due to amlodipine use.  Check CMET, BNP   RTC in 6 months   Medication Adjustments/Labs and Tests Ordered: Current medicines are reviewed at length with the patient today.  Concerns regarding medicines are outlined above.  Orders Placed This Encounter  Procedures   MR CARDIAC MORPHOLOGY W WO CONTRAST   MR Angiogram Chest W Wo Contrast   B Nat Peptide   CBC w/Diff/Platelet    Fe+TIBC+Fer   Comprehensive Metabolic Panel (CMET)   Ambulatory referral to Hematology / Oncology   EKG 12-Lead   No orders of the defined types were placed in this encounter.   Patient Instructions  Medication Instructions:  Continue all medciations *If you need a refill on your cardiac medications before your next appointment, please call your pharmacy*   Lab Work: Cbc,iron studies,cmet,bnp   Testing/Procedures: Cardiac MRI   will be scheduled after approved by insurance   Follow instructions given  Chest MRA  Follow-Up: At [facility], you and your health needs are our priority.  As part of our continuing mission to provide you with exceptional heart care, we have created designated Provider Care Teams.  These Care Teams include your primary Cardiologist (physician) and Advanced Practice Providers (APPs -  Physician Assistants and Nurse Practitioners) who all work together to provide you with the care you need, when you need it.  We recommend signing up for the patient portal called "MyChart".  Sign up information is provided on this After Visit Summary.  MyChart is used to connect with patients for Virtual Visits (Telemedicine).  Patients are able to view lab/test results, encounter notes, upcoming appointments, etc.  Non-urgent messages can be sent to your provider as well.   To learn more about what you can do with MyChart, go to [email or web address].    Your next appointment:  6 months     Provider:  Dr.Giancarlos Berendt       Referral to Hematology    scheduler will call with appointment            Signed, Little Ishikawa, MD  01/05/2023 1:55 PM    [facility]

## 2023-01-05 ENCOUNTER — Ambulatory Visit: Payer: Medicare Other | Attending: Cardiology | Admitting: Cardiology

## 2023-01-05 VITALS — BP 138/80 | HR 60 | Ht 69.0 in | Wt 202.0 lb

## 2023-01-05 DIAGNOSIS — I7121 Aneurysm of the ascending aorta, without rupture: Secondary | ICD-10-CM | POA: Insufficient documentation

## 2023-01-05 DIAGNOSIS — I2510 Atherosclerotic heart disease of native coronary artery without angina pectoris: Secondary | ICD-10-CM | POA: Diagnosis not present

## 2023-01-05 DIAGNOSIS — E785 Hyperlipidemia, unspecified: Secondary | ICD-10-CM | POA: Diagnosis not present

## 2023-01-05 DIAGNOSIS — R600 Localized edema: Secondary | ICD-10-CM | POA: Insufficient documentation

## 2023-01-05 DIAGNOSIS — I10 Essential (primary) hypertension: Secondary | ICD-10-CM | POA: Insufficient documentation

## 2023-01-05 DIAGNOSIS — R0600 Dyspnea, unspecified: Secondary | ICD-10-CM | POA: Diagnosis not present

## 2023-01-05 DIAGNOSIS — I480 Paroxysmal atrial fibrillation: Secondary | ICD-10-CM | POA: Diagnosis not present

## 2023-01-05 DIAGNOSIS — I4891 Unspecified atrial fibrillation: Secondary | ICD-10-CM | POA: Diagnosis not present

## 2023-01-05 NOTE — Patient Instructions (Addendum)
Medication Instructions:  Continue all medciations *If you need a refill on your cardiac medications before your next appointment, please call your pharmacy*   Lab Work: Cbc,iron studies,cmet,bnp   Testing/Procedures: Cardiac MRI   will be scheduled after approved by insurance   Follow instructions given  Chest MRA  Follow-Up: At [facility], you and your health needs are our priority.  As part of our continuing mission to provide you with exceptional heart care, we have created designated Provider Care Teams.  These Care Teams include your primary Cardiologist (physician) and Advanced Practice Providers (APPs -  Physician Assistants and Nurse Practitioners) who all work together to provide you with the care you need, when you need it.  We recommend signing up for the patient portal called "MyChart".  Sign up information is provided on this After Visit Summary.  MyChart is used to connect with patients for Virtual Visits (Telemedicine).  Patients are able to view lab/test results, encounter notes, upcoming appointments, etc.  Non-urgent messages can be sent to your provider as well.   To learn more about what you can do with MyChart, go to [email or web address].    Your next appointment:  6 months     Provider:  Dr.Schumann       Referral to Hematology    scheduler will call with appointment

## 2023-01-06 LAB — IRON,TIBC AND FERRITIN PANEL
Ferritin: 174 ng/mL (ref 30–400)
Iron Saturation: 49 % (ref 15–55)
Iron: 149 ug/dL (ref 38–169)
Total Iron Binding Capacity: 307 ug/dL (ref 250–450)
UIBC: 158 ug/dL (ref 111–343)

## 2023-01-06 LAB — CBC WITH DIFFERENTIAL/PLATELET
Basophils Absolute: 0 10*3/uL (ref 0.0–0.2)
Basos: 1 %
EOS (ABSOLUTE): 0.1 10*3/uL (ref 0.0–0.4)
Eos: 2 %
Hematocrit: 49.3 % (ref 37.5–51.0)
Hemoglobin: 16.5 g/dL (ref 13.0–17.7)
Immature Grans (Abs): 0 10*3/uL (ref 0.0–0.1)
Immature Granulocytes: 0 %
Lymphocytes Absolute: 1.1 10*3/uL (ref 0.7–3.1)
Lymphs: 17 %
MCH: 33.8 pg — ABNORMAL HIGH (ref 26.6–33.0)
MCHC: 33.5 g/dL (ref 31.5–35.7)
MCV: 101 fL — ABNORMAL HIGH (ref 79–97)
Monocytes Absolute: 0.8 10*3/uL (ref 0.1–0.9)
Monocytes: 12 %
Neutrophils Absolute: 4.3 10*3/uL (ref 1.4–7.0)
Neutrophils: 68 %
Platelets: 171 10*3/uL (ref 150–450)
RBC: 4.88 x10E6/uL (ref 4.14–5.80)
RDW: 13.5 % (ref 11.6–15.4)
WBC: 6.4 10*3/uL (ref 3.4–10.8)

## 2023-01-06 LAB — COMPREHENSIVE METABOLIC PANEL
ALT: 51 [IU]/L — ABNORMAL HIGH (ref 0–44)
AST: 46 [IU]/L — ABNORMAL HIGH (ref 0–40)
Albumin: 4.7 g/dL (ref 3.8–4.8)
Alkaline Phosphatase: 112 [IU]/L (ref 44–121)
BUN/Creatinine Ratio: 25 — ABNORMAL HIGH (ref 10–24)
BUN: 19 mg/dL (ref 8–27)
Bilirubin Total: 1.3 mg/dL — ABNORMAL HIGH (ref 0.0–1.2)
CO2: 26 mmol/L (ref 20–29)
Calcium: 9.2 mg/dL (ref 8.6–10.2)
Chloride: 101 mmol/L (ref 96–106)
Creatinine, Ser: 0.76 mg/dL (ref 0.76–1.27)
Globulin, Total: 2.7 g/dL (ref 1.5–4.5)
Glucose: 153 mg/dL — ABNORMAL HIGH (ref 70–99)
Potassium: 4.5 mmol/L (ref 3.5–5.2)
Sodium: 140 mmol/L (ref 134–144)
Total Protein: 7.4 g/dL (ref 6.0–8.5)
eGFR: 91 mL/min/{1.73_m2} (ref >59)

## 2023-01-06 LAB — BRAIN NATRIURETIC PEPTIDE: BNP: 64.4 pg/mL (ref 0.0–100.0)

## 2023-01-07 ENCOUNTER — Other Ambulatory Visit: Payer: Self-pay | Admitting: Nurse Practitioner

## 2023-01-07 ENCOUNTER — Telehealth: Payer: Self-pay | Admitting: *Deleted

## 2023-01-11 DIAGNOSIS — Z23 Encounter for immunization: Secondary | ICD-10-CM | POA: Diagnosis not present

## 2023-01-14 ENCOUNTER — Encounter: Payer: Self-pay | Admitting: *Deleted

## 2023-01-14 NOTE — Progress Notes (Signed)
PATIENT NAVIGATOR PROGRESS NOTE  Name: Randy Evans Date: 01/14/2023 MRN: [id number]  DOB: 12/23/1940   Reason for visit:  Telephone call regarding referral from Dr Armanda Magic  Comments:  Called and spoke with pt and discussed Hemochromatosis referral/ He states that he was diagnosed more than 20 years when he lived in [place].  There he received weekly then monthly phlebotomy procedures.  He states that he has had to have another procedure in many years.  During his initial visit with new cardiology provider they would like for him to establish care with Hematologist for this diagnosis.    Gave him driving directions and that our scheduler would call him for the New pt appt    Time spent counseling/coordinating care: 30-45 minutes

## 2023-01-19 ENCOUNTER — Ambulatory Visit (HOSPITAL_COMMUNITY)
Admission: RE | Admit: 2023-01-19 | Discharge: 2023-01-19 | Disposition: A | Discharge status: Home / Self Care | Payer: Medicare Other | Source: Ambulatory Visit | Attending: Cardiology | Admitting: Cardiology

## 2023-01-19 ENCOUNTER — Other Ambulatory Visit: Payer: Self-pay | Admitting: Cardiology

## 2023-01-19 DIAGNOSIS — I7121 Aneurysm of the ascending aorta, without rupture: Secondary | ICD-10-CM | POA: Diagnosis not present

## 2023-01-19 MED ORDER — GADOBUTROL 1 MMOL/ML IV SOLN
10.0000 mL | Freq: Once | INTRAVENOUS | Status: AC | PRN
  Administered 2023-01-19: 10 mL via INTRAVENOUS

## 2023-02-01 ENCOUNTER — Inpatient Hospital Stay: Payer: Medicare Other | Attending: Oncology

## 2023-02-01 ENCOUNTER — Inpatient Hospital Stay (HOSPITAL_BASED_OUTPATIENT_CLINIC_OR_DEPARTMENT_OTHER): Payer: Medicare Other | Admitting: Oncology

## 2023-02-01 ENCOUNTER — Other Ambulatory Visit: Payer: Self-pay

## 2023-02-01 ENCOUNTER — Encounter: Payer: Self-pay | Admitting: Oncology

## 2023-02-01 DIAGNOSIS — D7589 Other specified diseases of blood and blood-forming organs: Secondary | ICD-10-CM

## 2023-02-01 DIAGNOSIS — I480 Paroxysmal atrial fibrillation: Secondary | ICD-10-CM | POA: Diagnosis not present

## 2023-02-01 DIAGNOSIS — K900 Celiac disease: Secondary | ICD-10-CM

## 2023-02-01 DIAGNOSIS — I2510 Atherosclerotic heart disease of native coronary artery without angina pectoris: Secondary | ICD-10-CM

## 2023-02-01 DIAGNOSIS — I119 Hypertensive heart disease without heart failure: Secondary | ICD-10-CM

## 2023-02-01 DIAGNOSIS — L900 Lichen sclerosus et atrophicus: Secondary | ICD-10-CM | POA: Insufficient documentation

## 2023-02-01 DIAGNOSIS — Z7901 Long term (current) use of anticoagulants: Secondary | ICD-10-CM

## 2023-02-01 DIAGNOSIS — Z87891 Personal history of nicotine dependence: Secondary | ICD-10-CM | POA: Diagnosis not present

## 2023-02-01 DIAGNOSIS — I77819 Aortic ectasia, unspecified site: Secondary | ICD-10-CM

## 2023-02-01 DIAGNOSIS — Z79899 Other long term (current) drug therapy: Secondary | ICD-10-CM | POA: Insufficient documentation

## 2023-02-01 LAB — CBC WITH DIFFERENTIAL/PLATELET
Abs Immature Granulocytes: 0.02 10*3/uL (ref 0.00–0.07)
Basophils Absolute: 0 10*3/uL (ref 0.0–0.1)
Basophils Relative: 1 %
Eosinophils Absolute: 0.1 10*3/uL (ref 0.0–0.5)
Eosinophils Relative: 2 %
HCT: 46.4 % (ref 39.0–52.0)
Hemoglobin: 15.8 g/dL (ref 13.0–17.0)
Immature Granulocytes: 0 %
Lymphocytes Relative: 15 %
Lymphs Abs: 0.9 10*3/uL (ref 0.7–4.0)
MCH: 33.1 pg (ref 26.0–34.0)
MCHC: 34.1 g/dL (ref 30.0–36.0)
MCV: 97.3 fL (ref 80.0–100.0)
Monocytes Absolute: 0.7 10*3/uL (ref 0.1–1.0)
Monocytes Relative: 11 %
Neutro Abs: 4.2 10*3/uL (ref 1.7–7.7)
Neutrophils Relative %: 71 %
Platelets: 148 10*3/uL — ABNORMAL LOW (ref 150–400)
RBC: 4.77 MIL/uL (ref 4.22–5.81)
RDW: 13.2 % (ref 11.5–15.5)
WBC: 6 10*3/uL (ref 4.0–10.5)
nRBC: 0 % (ref 0.0–0.2)

## 2023-02-01 LAB — COMPREHENSIVE METABOLIC PANEL
ALT: 48 U/L — ABNORMAL HIGH (ref 0–44)
AST: 39 U/L (ref 15–41)
Albumin: 4.7 g/dL (ref 3.5–5.0)
Alkaline Phosphatase: 111 U/L (ref 38–126)
Anion gap: 5 (ref 5–15)
BUN: 17 mg/dL (ref 8–23)
CO2: 30 mmol/L (ref 22–32)
Calcium: 9.8 mg/dL (ref 8.9–10.3)
Chloride: 102 mmol/L (ref 98–111)
Creatinine, Ser: 0.75 mg/dL (ref 0.61–1.24)
GFR, Estimated: >60 mL/min (ref >60)
Glucose, Bld: 212 mg/dL — ABNORMAL HIGH (ref 70–99)
Potassium: 3.9 mmol/L (ref 3.5–5.1)
Sodium: 137 mmol/L (ref 135–145)
Total Bilirubin: 1 mg/dL (ref 0.3–1.2)
Total Protein: 7.3 g/dL (ref 6.5–8.1)

## 2023-02-01 LAB — LACTATE DEHYDROGENASE: LDH: 180 U/L (ref 98–192)

## 2023-02-01 LAB — FOLATE: Folate: 24 ng/mL (ref >5.9)

## 2023-02-01 LAB — FERRITIN: Ferritin: 108 ng/mL (ref 24–336)

## 2023-02-01 LAB — IRON AND IRON BINDING CAPACITY (CC-WL,HP ONLY)
Iron: 79 ug/dL (ref 45–182)
Saturation Ratios: 24 % (ref 17.9–39.5)
TIBC: 325 ug/dL (ref 250–450)
UIBC: 246 ug/dL

## 2023-02-01 LAB — VITAMIN B12: Vitamin B-12: 322 pg/mL (ref 180–914)

## 2023-02-01 NOTE — Progress Notes (Signed)
[facility]   HEMATOLOGY CLINIC CONSULTATION NOTE    PATIENT NAME: Randy Evans   MR#: [id number] DOB: Jul 14, 1940  DATE OF SERVICE: 02/01/2023   REFERRING PHYSICIAN  Epifanio Lesches, MD, Cardiology   Patient Care Team: Darrow Bussing, MD as PCP - General (Family Medicine) Marinus Maw, MD as PCP - Electrophysiology (Cardiology) Othella Boyer, MD as Consulting Physician (Cardiology) Little Ishikawa, MD as Consulting Physician (Cardiology)   REASON FOR CONSULTATION/ CHIEF COMPLAINT:  History of Hemochromatosis   HISTORY OF PRESENT ILLNESS:  JAYDENCE VANSON is a 82 y.o. gentleman with a past medical history of paroxysmal atrial fibrillation, ascending aortic aneurysm, hypertension, dyslipidemia, CAD, unspecified type of hemochromatosis, cerebral aneurysm status post coil embolization in [facility] in May 2006, was referred to our service for evaluation of hemochromatosis.    Discussed the use of AI scribe software for clinical note transcription with the patient, who gave verbal consent to proceed.   Patient was apparently diagnosed with hemochromatosis in the 77s when he was living in [place]. The patient was initially managed with weekly phlebotomies, which gradually decreased in frequency.  Last phlebotomy was few years ago.  The patient also has a history of heart issues, including atrial fibrillation, for which he has had ablations and cardioversions. The patient reports that his heart rate has been regular recently.  He was recently seen by his cardiologist Dr. Bjorn Pippin who referred him to [place] for his history of hemochromatosis.    The patient was diagnosed with celiac disease last year and has been managing it with dietary modifications. The patient reports some issues with bowel movements, particularly when consuming a lot of greens and salads. The patient also reports some leg swelling, which he attributes to an increase in his amlodipine  dosage.  He denies fever, cough, diarrhea, or other infectious symptoms. He denies epistaxis, bloody stool, melena, hematuria, bruising or other bleeding symptoms. He also denies unintentional weight loss, night sweats or other constitutional symptoms.  MEDICAL HISTORY Past Medical History:  Diagnosis Date   Atrial fibrillation (HCC)    Onset 1991 in [place]   Cerebral aneurysm without rupture    treated with coil embolization 2006   Gout    Hemochromatosis    Has seen Dorena Cookey   Hypertension      SURGICAL HISTORY Past Surgical History:  Procedure Laterality Date   ANEURYSM COILING  2006   bone spur removal  1951   below left knee   BRAIN SURGERY     CARDIAC ELECTROPHYSIOLOGY MAPPING AND ABLATION  01/2008; 07/2008   CARDIOVERSION  2000; 2008 03/2008; ;02/15/2011   2008; 2009 2012:    Surgeon: Darden Palmer., MD;  Location: [facility] OR;  Service: Cardiovascular;  Laterality: N/A;   CARDIOVERSION  08/05/2011   Procedure: CARDIOVERSION;  Surgeon: Othella Boyer, MD;  Location: [facility] OR;  Service: Cardiovascular;  Laterality: N/A;   CATARACT EXTRACTION W/ INTRAOCULAR LENS  IMPLANT, BILATERAL  2009-2010   IR ANGIO INTRA EXTRACRAN SEL COM CAROTID INNOMINATE UNI L MOD SED  11/09/2016   IR ANGIO INTRA EXTRACRAN SEL COM CAROTID INNOMINATE UNI R MOD SED  07/17/2019   IR ANGIO INTRA EXTRACRAN SEL INTERNAL CAROTID UNI R MOD SED  11/09/2016   IR US GUIDE VASC ACCESS RIGHT  07/17/2019   LIVER BIOPSY  late 1990's   TOE SURGERY       SOCIAL HISTORY: He reports that he has quit smoking. His smoking use included  cigarettes, pipe, and cigars. He has never used smokeless tobacco. He reports current alcohol use of about 2.0 standard drinks of alcohol per week. He reports that he does not use drugs. Social History   Socioeconomic History   Marital status: Married    Spouse name: Not on file   Number of children: Not on file   Years of education: Not on file   Highest education level: Not on file   Occupational History   Not on file  Tobacco Use   Smoking status: Former    Types: Cigarettes, Pipe, Cigars   Smokeless tobacco: Never   Tobacco comments:    quit smoking 1966  Substance and Sexual Activity   Alcohol use: Yes    Alcohol/week: 2.0 standard drinks of alcohol    Types: 2 Cans of beer per week   Drug use: No   Sexual activity: Yes  Other Topics Concern   Not on file  Social History Narrative   Retired from [employer].  Moved from [place]. Graduate of [employer].  Married.   Social Determinants of Health   Financial Resource Strain: Not on file  Food Insecurity: No Food Insecurity (02/01/2023)   Hunger Vital Sign    Worried About Running Out of Food in the Last Year: Never true    Ran Out of Food in the Last Year: Never true  Transportation Needs: No Transportation Needs (02/01/2023)   PRAPARE - Administrator, Civil Service (Medical): No    Lack of Transportation (Non-Medical): No  Physical Activity: Not on file  Stress: Not on file  Social Connections: Not on file  Intimate Partner Violence: Not At Risk (02/01/2023)   Humiliation, Afraid, Rape, and Kick questionnaire    Fear of Current or Ex-Partner: No    Emotionally Abused: No    Physically Abused: No    Sexually Abused: No    FAMILY HISTORY: His family history is not on file.  CURRENT MEDICATIONS   Current Outpatient Medications  Medication Instructions   acetaminophen (TYLENOL) 1,000 mg, Every 6 hours PRN   allopurinol (ZYLOPRIM) 300 mg, Daily   amLODipine (NORVASC) 10 mg, Oral, Daily   Ascorbic Acid (VITAMIN C PO) 1 tablet, Oral, Daily   atorvastatin (LIPITOR) 10 mg, Oral, Daily   chlorpheniramine (CHLOR-TRIMETON) 4 mg, 2 times daily PRN   clobetasol cream (TEMOVATE) 0.05 % Topical, Daily   Eliquis 5 mg, Oral, 2 times daily   ketoconazole (NIZORAL) 2 % cream Topical, Daily   metoprolol succinate (TOPROL-XL) 50 MG 24 hr tablet TAKE 1 TABLET BY MOUTH EVERY DAY WITH OR IMMEDIATELY FOLLOWING  A MEAL   multivitamin-lutein (OCUVITE-LUTEIN) CAPS 1 capsule, Daily   Omega-3 Fatty Acids (FISH OIL PO) 1 Capful, Oral, Daily   triamcinolone cream (KENALOG) 0.1 % As needed   valsartan (DIOVAN) 320 mg, Oral, Daily   VITAMIN D, ERGOCALCIFEROL, PO 1 Capful, Oral, Daily     ALLERGIES  He is allergic to codeine and sulfa antibiotics.  REVIEW OF SYSTEMS:  Review of Systems - Oncology   Rest of the pertinent review of systems is unremarkable except as mentioned above in HPI.  PHYSICAL EXAMINATION:  ECOG PERFORMANCE STATUS: 0 - Asymptomatic  Vitals:   02/01/23 0840  BP: (!) 150/72  Pulse: 72  Resp: 18  Temp: 98.1 F (36.7 C)  SpO2: 97%   Filed Weights   02/01/23 0840  Weight: 207 lb (93.9 kg)    Physical Exam Constitutional:      General:  He is not in acute distress.    Appearance: Normal appearance.  HENT:     Head: Normocephalic and atraumatic.  Eyes:     General: No scleral icterus.    Extraocular Movements: Extraocular movements intact.     Conjunctiva/sclera: Conjunctivae normal.  Cardiovascular:     Rate and Rhythm: Normal rate and regular rhythm.     Pulses: Normal pulses.     Heart sounds: Normal heart sounds.  Pulmonary:     Effort: Pulmonary effort is normal.     Breath sounds: Normal breath sounds.  Abdominal:     General: There is no distension.  Musculoskeletal:     Right lower leg: Edema present.     Left lower leg: Edema present.  Skin:    Findings: No rash.  Neurological:     General: No focal deficit present.     Mental Status: He is alert and oriented to person, place, and time.  Psychiatric:        Mood and Affect: Mood normal.        Behavior: Behavior normal.        Thought Content: Thought content normal.        Judgment: Judgment normal.     LABORATORY DATA:   I have reviewed the data as listed.  Results for orders placed or performed in visit on 02/01/23  Vitamin B12  Result Value Ref Range   Vitamin B-12 322 180 - 914  pg/mL  Lactate dehydrogenase  Result Value Ref Range   LDH 180 98 - 192 U/L  Ferritin  Result Value Ref Range   Ferritin 108 24 - 336 ng/mL  Comprehensive metabolic panel  Result Value Ref Range   Sodium 137 135 - 145 mmol/L   Potassium 3.9 3.5 - 5.1 mmol/L   Chloride 102 98 - 111 mmol/L   CO2 30 22 - 32 mmol/L   Glucose, Bld 212 (H) 70 - 99 mg/dL   BUN 17 8 - 23 mg/dL   Creatinine, Ser 2.13 0.61 - 1.24 mg/dL   Calcium 9.8 8.9 - 08.6 mg/dL   Total Protein 7.3 6.5 - 8.1 g/dL   Albumin 4.7 3.5 - 5.0 g/dL   AST 39 15 - 41 U/L   ALT 48 (H) 0 - 44 U/L   Alkaline Phosphatase 111 38 - 126 U/L   Total Bilirubin 1.0 0.3 - 1.2 mg/dL   GFR, Estimated >57 >84 mL/min   Anion gap 5 5 - 15  CBC with Differential/Platelet  Result Value Ref Range   WBC 6.0 4.0 - 10.5 K/uL   RBC 4.77 4.22 - 5.81 MIL/uL   Hemoglobin 15.8 13.0 - 17.0 g/dL   HCT 69.6 29.5 - 28.4 %   MCV 97.3 80.0 - 100.0 fL   MCH 33.1 26.0 - 34.0 pg   MCHC 34.1 30.0 - 36.0 g/dL   RDW 13.2 44.0 - 10.2 %   Platelets 148 (L) 150 - 400 K/uL   nRBC 0.0 0.0 - 0.2 %   Neutrophils Relative % 71 %   Neutro Abs 4.2 1.7 - 7.7 K/uL   Lymphocytes Relative 15 %   Lymphs Abs 0.9 0.7 - 4.0 K/uL   Monocytes Relative 11 %   Monocytes Absolute 0.7 0.1 - 1.0 K/uL   Eosinophils Relative 2 %   Eosinophils Absolute 0.1 0.0 - 0.5 K/uL   Basophils Relative 1 %   Basophils Absolute 0.0 0.0 - 0.1 K/uL   Immature Granulocytes 0 %  Abs Immature Granulocytes 0.02 0.00 - 0.07 K/uL     Lab Results  Component Value Date   IRON 149 01/05/2023   TIBC 307 01/05/2023   FERRITIN 108 02/01/2023      RADIOGRAPHIC STUDIES:  I have personally reviewed the radiological images as listed and agreed with the findings in the report.  MR CARDIAC MORPHOLOGY W WO CONTRAST  Result Date: 01/31/2023 CLINICAL DATA:  Hemochromatosis; Aortic aneurysm suspected AAA EXAM: MR CARDIA MORPHOLOGY WITHOUT AND WITH CONTRAST; MR CARDIAC VELOCITY FLOW MAPPING; MRA  CHEST WITH OR WITHOUT CONTRAST TECHNIQUE: The patient was scanned on a 1.5 Tesla Siemens magnet. A dedicated cardiac coil was used. Functional imaging was done using TrueFisp sequences. 2,3, and 4 chamber views were done to assess for RWMA's. Modified Simpson's rule using a short axis stack was used to calculate an ejection fraction on a dedicated work [employer]. The patient received 10mL GADAVIST GADOBUTROL 1 MMOL/ML IV SOLN. After 10 minutes inversion recovery sequences were used to assess for infiltration and scar tissue. Phase contrast velocity encoded images obtained at two levels at . This examination is tailored for evaluation cardiac anatomy and function and provides very limited assessment of noncardiac structures, which are accordingly not evaluated during interpretation. If there is clinical concern for extracardiac pathology, further evaluation with CT imaging should be considered. FINDINGS: LEFT VENTRICLE: Normal left ventricular chamber size. Moderately increased left ventricular wall thickness. Maximal wall thickness 15 mm in the basal-mid septal wall. Normal left ventricular systolic function. LVEF = 56% Regional wall motion abnormalities: There is mild hypokinesis of the mid inferolateral wall. No myocardial edema, T2 = 52 msec There is post contrast delayed myocardial enhancement: Unchanged pattern of LGE in the lateral wall. Basal and mid inferolateral wall LGE subendocardial and midmyocardial, >50% wall thickness, and basal anterolateral wall subendocardial LGE <50% wall thickness. Most consistent with prior infarct. There is also basal to mid inferior RV insertion point LGE which may indicate increased pulmonary pressures. Normal T1 myocardial nulling kinetics suggest against a diagnosis of cardiac amyloidosis. ECV = 33%, nonspecific elevation. T2* values: 40 ms base, 36 ms mid, 38 ms apex. No evidence of iron overload. RIGHT VENTRICLE: Normal right ventricular chamber size.  Normal right ventricular wall thickness. Normal right ventricular systolic function. RVEF = 59% There are no regional wall motion abnormalities. No post contrast delayed myocardial enhancement. AORTA: Sinus of Valsalva: R-L: 41 mm L-Non: 39 mm R-Non: 43 mm Mid ascending aorta (at PA bifurcation): 42 mm Arch (between left common carotid and left subclavian arteries): 30 mm Proximal descending aorta: 36 mm Mid descending aorta (at level of pulmonary veins): 30 mm Distal descending aorta (just proximal to aortic hiatus): 28 mm ATRIA: Left atrium: Moderately dilated Right atrium: normal VALVES: No significant valvular abnormalities. PERICARDIUM: Normal pericardium.  Trivial pericardial effusion. OTHER: No significant extracardiac findings. MEASUREMENTS: Respiratory motion artifact present, may render measurements approximate values. Qp/Qs: Not performed. Aortic valve regurgitation: None, regurgitant fraction 0% Pulmonary valve regurgitation: Not calculated Mitral valve regurgitation: mild, regurgitant fraction 16% Tricuspid valve regurgitation: Not calculated Left ventricle: LV male LV EF: 56% (normal 49-79%) Absolute volumes: LV EDV: (normal 95-215 mL) LV ESV: 83mL (Normal 25-85 mL) LV SV: (Normal 61-145 mL) CO: 5.8L/min (Normal 3.4-7.8 L/min) Indexed volumes: LV EDV: 41mL/sq-m (Normal 50-108 mL/sq-m) LV ESV: 34mL/sq-m (Normal 11-47 mL/sq-m) LV SV: 84mL/sq-m (Normal 33-72 mL/sq-m) CI: 2.73L/min/sq-m (Normal 1.8-4.2 L/min/sq-m) Right ventricle: RV male RV EF:  59% (Normal 51-80%) Absolute volumes: RV  EDV: (Normal 109-217 mL) RV ESV: 73mL (Normal 23-91 mL) RV SV: (Normal 71-141 mL) CO: 5.8L/min (Normal 2.8-8.8 L/min) Indexed volumes: RV EDV: 1mL/sq-m (Normal 58-109 mL/sq-m) RV ESV: 26mL/sq-m (Normal 12-46 mL/sq-m) RV SV: 76mL/sq-m (Normal 38-71 mL/sq-m) CI: 2.74L/min/sq-m (Normal 1.7-4.2 L/min/sq-m) IMPRESSION: 1. Normal LV systolic function, normal LV chamber size. LVEF 56%. Moderate increase in  LV wall thickness, 15 mm in the septum. Mild hypokinesis of the mid inferolateral wall. Unchanged pattern of delayed myocardial enhancement in the basal anterolateral and basal-mid inferolateral wall most consistent with prior infarct. 2.  No evidence of myocardial iron overload, average T2* 38 ms. 3. Normal right ventricular chamber size and function, RVEF 59%. There is basal to mid inferior RV insertion point LGE which may indicate increased pulmonary pressures. 4. Ascending aorta dilation, mild dilation of sinus of Valsalva, 43 mm, mid ascending aorta 42 mm, and proximal descending aorta 36 mm. 5.  Mild mitral valve regurgitation, MR regurgitant fraction 16%. Electronically Signed   By: Weston Brass M.D.   On: 01/31/2023 12:58   MR Angiogram Chest W Wo Contrast  Result Date: 01/31/2023 CLINICAL DATA:  Hemochromatosis; Aortic aneurysm suspected AAA EXAM: MR CARDIA MORPHOLOGY WITHOUT AND WITH CONTRAST; MR CARDIAC VELOCITY FLOW MAPPING; MRA CHEST WITH OR WITHOUT CONTRAST TECHNIQUE: The patient was scanned on a 1.5 Tesla Siemens magnet. A dedicated cardiac coil was used. Functional imaging was done using TrueFisp sequences. 2,3, and 4 chamber views were done to assess for RWMA's. Modified Simpson's rule using a short axis stack was used to calculate an ejection fraction on a dedicated work [employer], [employer]. The patient received 10mL GADAVIST GADOBUTROL 1 MMOL/ML IV SOLN. After 10 minutes inversion recovery sequences were used to assess for infiltration and scar tissue. Phase contrast velocity encoded images obtained at two levels at . This examination is tailored for evaluation cardiac anatomy and function and provides very limited assessment of noncardiac structures, which are accordingly not evaluated during interpretation. If there is clinical concern for extracardiac pathology, further evaluation with CT imaging should be considered. FINDINGS: LEFT VENTRICLE: Normal left ventricular chamber  size. Moderately increased left ventricular wall thickness. Maximal wall thickness 15 mm in the basal-mid septal wall. Normal left ventricular systolic function. LVEF = 56% Regional wall motion abnormalities: There is mild hypokinesis of the mid inferolateral wall. No myocardial edema, T2 = 52 msec There is post contrast delayed myocardial enhancement: Unchanged pattern of LGE in the lateral wall. Basal and mid inferolateral wall LGE subendocardial and midmyocardial, >50% wall thickness, and basal anterolateral wall subendocardial LGE <50% wall thickness. Most consistent with prior infarct. There is also basal to mid inferior RV insertion point LGE which may indicate increased pulmonary pressures. Normal T1 myocardial nulling kinetics suggest against a diagnosis of cardiac amyloidosis. ECV = 33%, nonspecific elevation. T2* values: 40 ms base, 36 ms mid, 38 ms apex. No evidence of iron overload. RIGHT VENTRICLE: Normal right ventricular chamber size. Normal right ventricular wall thickness. Normal right ventricular systolic function. RVEF = 59% There are no regional wall motion abnormalities. No post contrast delayed myocardial enhancement. AORTA: Sinus of Valsalva: R-L: 41 mm L-Non: 39 mm R-Non: 43 mm Mid ascending aorta (at PA bifurcation): 42 mm Arch (between left common carotid and left subclavian arteries): 30 mm Proximal descending aorta: 36 mm Mid descending aorta (at level of pulmonary veins): 30 mm Distal descending aorta (just proximal to aortic hiatus): 28 mm ATRIA: Left atrium: Moderately dilated Right atrium: normal VALVES: No  significant valvular abnormalities. PERICARDIUM: Normal pericardium.  Trivial pericardial effusion. OTHER: No significant extracardiac findings. MEASUREMENTS: Respiratory motion artifact present, may render measurements approximate values. Qp/Qs: Not performed. Aortic valve regurgitation: None, regurgitant fraction 0% Pulmonary valve regurgitation: Not calculated Mitral valve  regurgitation: mild, regurgitant fraction 16% Tricuspid valve regurgitation: Not calculated Left ventricle: LV male LV EF: 56% (normal 49-79%) Absolute volumes: LV EDV: (normal 95-215 mL) LV ESV: 83mL (Normal 25-85 mL) LV SV: (Normal 61-145 mL) CO: 5.8L/min (Normal 3.4-7.8 L/min) Indexed volumes: LV EDV: 73mL/sq-m (Normal 50-108 mL/sq-m) LV ESV: 19mL/sq-m (Normal 11-47 mL/sq-m) LV SV: 35mL/sq-m (Normal 33-72 mL/sq-m) CI: 2.73L/min/sq-m (Normal 1.8-4.2 L/min/sq-m) Right ventricle: RV male RV EF:  59% (Normal 51-80%) Absolute volumes: RV EDV: (Normal 109-217 mL) RV ESV: 73mL (Normal 23-91 mL) RV SV: (Normal 71-141 mL) CO: 5.8L/min (Normal 2.8-8.8 L/min) Indexed volumes: RV EDV: 20mL/sq-m (Normal 58-109 mL/sq-m) RV ESV: 66mL/sq-m (Normal 12-46 mL/sq-m) RV SV: 37mL/sq-m (Normal 38-71 mL/sq-m) CI: 2.74L/min/sq-m (Normal 1.7-4.2 L/min/sq-m) IMPRESSION: 1. Normal LV systolic function, normal LV chamber size. LVEF 56%. Moderate increase in LV wall thickness, 15 mm in the septum. Mild hypokinesis of the mid inferolateral wall. Unchanged pattern of delayed myocardial enhancement in the basal anterolateral and basal-mid inferolateral wall most consistent with prior infarct. 2.  No evidence of myocardial iron overload, average T2* 38 ms. 3. Normal right ventricular chamber size and function, RVEF 59%. There is basal to mid inferior RV insertion point LGE which may indicate increased pulmonary pressures. 4. Ascending aorta dilation, mild dilation of sinus of Valsalva, 43 mm, mid ascending aorta 42 mm, and proximal descending aorta 36 mm. 5.  Mild mitral valve regurgitation, MR regurgitant fraction 16%. Electronically Signed   By: Weston Brass M.D.   On: 01/31/2023 12:58   MR CARDIAC VELOCITY FLOW MAP  Result Date: 01/31/2023 CLINICAL DATA:  Hemochromatosis; Aortic aneurysm suspected AAA EXAM: MR CARDIA MORPHOLOGY WITHOUT AND WITH CONTRAST; MR CARDIAC VELOCITY FLOW MAPPING; MRA CHEST WITH OR  WITHOUT CONTRAST TECHNIQUE: The patient was scanned on a 1.5 Tesla Siemens magnet. A dedicated cardiac coil was used. Functional imaging was done using TrueFisp sequences. 2,3, and 4 chamber views were done to assess for RWMA's. Modified Simpson's rule using a short axis stack was used to calculate an ejection fraction on a dedicated work [employer]. The patient received 10mL GADAVIST GADOBUTROL 1 MMOL/ML IV SOLN. After 10 minutes inversion recovery sequences were used to assess for infiltration and scar tissue. Phase contrast velocity encoded images obtained at two levels at . This examination is tailored for evaluation cardiac anatomy and function and provides very limited assessment of noncardiac structures, which are accordingly not evaluated during interpretation. If there is clinical concern for extracardiac pathology, further evaluation with CT imaging should be considered. FINDINGS: LEFT VENTRICLE: Normal left ventricular chamber size. Moderately increased left ventricular wall thickness. Maximal wall thickness 15 mm in the basal-mid septal wall. Normal left ventricular systolic function. LVEF = 56% Regional wall motion abnormalities: There is mild hypokinesis of the mid inferolateral wall. No myocardial edema, T2 = 52 msec There is post contrast delayed myocardial enhancement: Unchanged pattern of LGE in the lateral wall. Basal and mid inferolateral wall LGE subendocardial and midmyocardial, >50% wall thickness, and basal anterolateral wall subendocardial LGE <50% wall thickness. Most consistent with prior infarct. There is also basal to mid inferior RV insertion point LGE which may indicate increased pulmonary pressures. Normal T1 myocardial nulling kinetics suggest against a diagnosis of  cardiac amyloidosis. ECV = 33%, nonspecific elevation. T2* values: 40 ms base, 36 ms mid, 38 ms apex. No evidence of iron overload. RIGHT VENTRICLE: Normal right ventricular chamber size. Normal right  ventricular wall thickness. Normal right ventricular systolic function. RVEF = 59% There are no regional wall motion abnormalities. No post contrast delayed myocardial enhancement. AORTA: Sinus of Valsalva: R-L: 41 mm L-Non: 39 mm R-Non: 43 mm Mid ascending aorta (at PA bifurcation): 42 mm Arch (between left common carotid and left subclavian arteries): 30 mm Proximal descending aorta: 36 mm Mid descending aorta (at level of pulmonary veins): 30 mm Distal descending aorta (just proximal to aortic hiatus): 28 mm ATRIA: Left atrium: Moderately dilated Right atrium: normal VALVES: No significant valvular abnormalities. PERICARDIUM: Normal pericardium.  Trivial pericardial effusion. OTHER: No significant extracardiac findings. MEASUREMENTS: Respiratory motion artifact present, may render measurements approximate values. Qp/Qs: Not performed. Aortic valve regurgitation: None, regurgitant fraction 0% Pulmonary valve regurgitation: Not calculated Mitral valve regurgitation: mild, regurgitant fraction 16% Tricuspid valve regurgitation: Not calculated Left ventricle: LV male LV EF: 56% (normal 49-79%) Absolute volumes: LV EDV: (normal 95-215 mL) LV ESV: 83mL (Normal 25-85 mL) LV SV: (Normal 61-145 mL) CO: 5.8L/min (Normal 3.4-7.8 L/min) Indexed volumes: LV EDV: 83mL/sq-m (Normal 50-108 mL/sq-m) LV ESV: 31mL/sq-m (Normal 11-47 mL/sq-m) LV SV: 43mL/sq-m (Normal 33-72 mL/sq-m) CI: 2.73L/min/sq-m (Normal 1.8-4.2 L/min/sq-m) Right ventricle: RV male RV EF:  59% (Normal 51-80%) Absolute volumes: RV EDV: (Normal 109-217 mL) RV ESV: 73mL (Normal 23-91 mL) RV SV: (Normal 71-141 mL) CO: 5.8L/min (Normal 2.8-8.8 L/min) Indexed volumes: RV EDV: 37mL/sq-m (Normal 58-109 mL/sq-m) RV ESV: 31mL/sq-m (Normal 12-46 mL/sq-m) RV SV: 7mL/sq-m (Normal 38-71 mL/sq-m) CI: 2.74L/min/sq-m (Normal 1.7-4.2 L/min/sq-m) IMPRESSION: 1. Normal LV systolic function, normal LV chamber size. LVEF 56%. Moderate increase in LV wall  thickness, 15 mm in the septum. Mild hypokinesis of the mid inferolateral wall. Unchanged pattern of delayed myocardial enhancement in the basal anterolateral and basal-mid inferolateral wall most consistent with prior infarct. 2.  No evidence of myocardial iron overload, average T2* 38 ms. 3. Normal right ventricular chamber size and function, RVEF 59%. There is basal to mid inferior RV insertion point LGE which may indicate increased pulmonary pressures. 4. Ascending aorta dilation, mild dilation of sinus of Valsalva, 43 mm, mid ascending aorta 42 mm, and proximal descending aorta 36 mm. 5.  Mild mitral valve regurgitation, MR regurgitant fraction 16%. Electronically Signed   By: Weston Brass M.D.   On: 01/31/2023 12:58   MR CARDIAC VELOCITY FLOW MAP  Result Date: 01/31/2023 CLINICAL DATA:  Hemochromatosis; Aortic aneurysm suspected AAA EXAM: MR CARDIA MORPHOLOGY WITHOUT AND WITH CONTRAST; MR CARDIAC VELOCITY FLOW MAPPING; MRA CHEST WITH OR WITHOUT CONTRAST TECHNIQUE: The patient was scanned on a 1.5 Tesla Siemens magnet. A dedicated cardiac coil was used. Functional imaging was done using TrueFisp sequences. 2,3, and 4 chamber views were done to assess for RWMA's. Modified Simpson's rule using a short axis stack was used to calculate an ejection fraction on a dedicated work [employer]. The patient received 10mL GADAVIST GADOBUTROL 1 MMOL/ML IV SOLN. After 10 minutes inversion recovery sequences were used to assess for infiltration and scar tissue. Phase contrast velocity encoded images obtained at two levels at . This examination is tailored for evaluation cardiac anatomy and function and provides very limited assessment of noncardiac structures, which are accordingly not evaluated during interpretation. If there is clinical concern for extracardiac pathology, further evaluation with CT imaging  should be considered. FINDINGS: LEFT VENTRICLE: Normal left ventricular chamber size.  Moderately increased left ventricular wall thickness. Maximal wall thickness 15 mm in the basal-mid septal wall. Normal left ventricular systolic function. LVEF = 56% Regional wall motion abnormalities: There is mild hypokinesis of the mid inferolateral wall. No myocardial edema, T2 = 52 msec There is post contrast delayed myocardial enhancement: Unchanged pattern of LGE in the lateral wall. Basal and mid inferolateral wall LGE subendocardial and midmyocardial, >50% wall thickness, and basal anterolateral wall subendocardial LGE <50% wall thickness. Most consistent with prior infarct. There is also basal to mid inferior RV insertion point LGE which may indicate increased pulmonary pressures. Normal T1 myocardial nulling kinetics suggest against a diagnosis of cardiac amyloidosis. ECV = 33%, nonspecific elevation. T2* values: 40 ms base, 36 ms mid, 38 ms apex. No evidence of iron overload. RIGHT VENTRICLE: Normal right ventricular chamber size. Normal right ventricular wall thickness. Normal right ventricular systolic function. RVEF = 59% There are no regional wall motion abnormalities. No post contrast delayed myocardial enhancement. AORTA: Sinus of Valsalva: R-L: 41 mm L-Non: 39 mm R-Non: 43 mm Mid ascending aorta (at PA bifurcation): 42 mm Arch (between left common carotid and left subclavian arteries): 30 mm Proximal descending aorta: 36 mm Mid descending aorta (at level of pulmonary veins): 30 mm Distal descending aorta (just proximal to aortic hiatus): 28 mm ATRIA: Left atrium: Moderately dilated Right atrium: normal VALVES: No significant valvular abnormalities. PERICARDIUM: Normal pericardium.  Trivial pericardial effusion. OTHER: No significant extracardiac findings. MEASUREMENTS: Respiratory motion artifact present, may render measurements approximate values. Qp/Qs: Not performed. Aortic valve regurgitation: None, regurgitant fraction 0% Pulmonary valve regurgitation: Not calculated Mitral valve  regurgitation: mild, regurgitant fraction 16% Tricuspid valve regurgitation: Not calculated Left ventricle: LV male LV EF: 56% (normal 49-79%) Absolute volumes: LV EDV: (normal 95-215 mL) LV ESV: 83mL (Normal 25-85 mL) LV SV: (Normal 61-145 mL) CO: 5.8L/min (Normal 3.4-7.8 L/min) Indexed volumes: LV EDV: 40mL/sq-m (Normal 50-108 mL/sq-m) LV ESV: 66mL/sq-m (Normal 11-47 mL/sq-m) LV SV: 56mL/sq-m (Normal 33-72 mL/sq-m) CI: 2.73L/min/sq-m (Normal 1.8-4.2 L/min/sq-m) Right ventricle: RV male RV EF:  59% (Normal 51-80%) Absolute volumes: RV EDV: (Normal 109-217 mL) RV ESV: 73mL (Normal 23-91 mL) RV SV: (Normal 71-141 mL) CO: 5.8L/min (Normal 2.8-8.8 L/min) Indexed volumes: RV EDV: 69mL/sq-m (Normal 58-109 mL/sq-m) RV ESV: 52mL/sq-m (Normal 12-46 mL/sq-m) RV SV: 39mL/sq-m (Normal 38-71 mL/sq-m) CI: 2.74L/min/sq-m (Normal 1.7-4.2 L/min/sq-m) IMPRESSION: 1. Normal LV systolic function, normal LV chamber size. LVEF 56%. Moderate increase in LV wall thickness, 15 mm in the septum. Mild hypokinesis of the mid inferolateral wall. Unchanged pattern of delayed myocardial enhancement in the basal anterolateral and basal-mid inferolateral wall most consistent with prior infarct. 2.  No evidence of myocardial iron overload, average T2* 38 ms. 3. Normal right ventricular chamber size and function, RVEF 59%. There is basal to mid inferior RV insertion point LGE which may indicate increased pulmonary pressures. 4. Ascending aorta dilation, mild dilation of sinus of Valsalva, 43 mm, mid ascending aorta 42 mm, and proximal descending aorta 36 mm. 5.  Mild mitral valve regurgitation, MR regurgitant fraction 16%. Electronically Signed   By: Weston Brass M.D.   On: 01/31/2023 12:58    ASSESSMENT & PLAN:  82 y.o. gentleman with a past medical history of paroxysmal atrial fibrillation, ascending aortic aneurysm, hypertension, dyslipidemia, CAD, unspecified type of hemochromatosis, cerebral aneurysm status post  coil embolization in [facility] in May 2006, was referred to our service for  evaluation of hemochromatosis.    Hemochromatosis History of diagnosis in the 1990s with subsequent phlebotomies. No recent phlebotomies.  He apparently even had liver biopsy.  Unclear genetic mutation. -Discussed various genetic mutations that can be associated with hemochromatosis, their implications, plan of care, treatment options.  If homozygous for C282Y mutation, then goal would be to maintain ferritin below 50 and iron saturation below 50%.  If there is complex heterozygosity or H63D mutation alone, then goal ferritin can be adjusted depending on rest of the phenotype. -His LFTs seem to be slightly fluctuating.  They are near normal today.  Recent cardiac MRI on 01/19/2023 showed no evidence of iron overload in the heart -Request submitted today for hereditary hemochromatosis testing to include C282Y and H63D mutations. -I will follow-up on all the above workup and formulate plan for therapeutic phlebotomies as needed.  For now, I am arranging a phone call visit in 2 weeks.  ACD (adult celiac disease) Diagnosed in 2023 by Brahmbhatt, MD. Currently on a gluten-free diet. Reports occasional loose stools and leakage with increased intake of greens and salads. -Continue gluten-free diet and other dietary modifications to manage bowel symptoms.  Atrial fibrillation (HCC) - Has had multiple cardioversions and ablations in the past.   - On Eliquis for anticoagulation. Regular heart rate noted on examination. -Continue Eliquis as prescribed.  Plan for follow-up -Schedule phone call visit in 2 weeks to discuss results. -Schedule in-person visit in 3 months to reassess condition and treatment plan.  Orders Placed This Encounter  Procedures   Hemochromatosis DNA, PCR    Standing Status:   Future    Number of Occurrences:   1    Standing Expiration Date:   02/01/2024   CBC with Differential/Platelet    Standing Status:    Future    Number of Occurrences:   1    Standing Expiration Date:   02/01/2024   Comprehensive metabolic panel    Standing Status:   Future    Number of Occurrences:   1    Standing Expiration Date:   02/01/2024   Ferritin    Standing Status:   Future    Number of Occurrences:   1    Standing Expiration Date:   02/01/2024   Iron and Iron Binding Capacity ([facility] only)    Standing Status:   Future    Number of Occurrences:   1    Standing Expiration Date:   02/01/2024   Lactate dehydrogenase    Standing Status:   Future    Number of Occurrences:   1    Standing Expiration Date:   02/01/2024   Vitamin B12    Standing Status:   Future    Number of Occurrences:   1    Standing Expiration Date:   02/01/2024   Folate    Standing Status:   Future    Number of Occurrences:   1    Standing Expiration Date:   02/01/2024    The total time spent in the appointment was 55 minutes encounter with the patient, including review of chart and results of various tests, discussion about plan of care and coordination of care plan.  I reviewed lab results and outside records for this visit and discussed relevant results with the patient. Diagnosis, plan of care and treatment options were also discussed in detail with the patient. Opportunity provided to ask questions and answers provided to his apparent satisfaction. Provided instructions to call our clinic with any problems,  questions or concerns prior to return visit. I recommended to continue follow-up with PCP and sub-specialists. He verbalized understanding and agreed with the plan. No barriers to learning was detected.   Future Appointments  Date Time Provider Department Center  02/15/2023 11:40 AM [name], MD [facility] None  05/03/2023 10:45 AM [facility] PHLEBOTOMIST [facility] None  05/03/2023 11:00 AM Donnice Nielsen, Archie Patten, MD [facility] None  06/22/2023  1:20 PM Little Ishikawa, MD [facility] None     [name], MD  02/01/2023  1:17 PM  [facility] [address] [place] [address] Dept: [phone] Dept Fax: [phone]    This document was completed utilizing speech recognition software. Grammatical errors, random word insertions, pronoun errors, and incomplete sentences are an occasional consequence of this system due to software limitations, ambient noise, and hardware issues. Any formal questions or concerns about the content, text or information contained within the body of this dictation should be directly addressed to the provider for clarification.

## 2023-02-01 NOTE — Assessment & Plan Note (Addendum)
History of diagnosis in the 1990s with subsequent phlebotomies. No recent phlebotomies.  He apparently even had liver biopsy.  Unclear genetic mutation. -Discussed various genetic mutations that can be associated with hemochromatosis, their implications, plan of care, treatment options.  If homozygous for C282Y mutation, then goal would be to maintain ferritin below 50 and iron saturation below 50%.  If there is complex heterozygosity or H63D mutation alone, then goal ferritin can be adjusted depending on rest of the phenotype. -His LFTs seem to be slightly fluctuating.  They are near normal today.  Recent cardiac MRI on 01/19/2023 showed no evidence of iron overload in the heart -Request submitted today for hereditary hemochromatosis testing to include C282Y and H63D mutations. -I will follow-up on all the above workup and formulate plan for therapeutic phlebotomies as needed.  For now, I am arranging a phone call visit in 2 weeks.

## 2023-02-01 NOTE — Assessment & Plan Note (Signed)
-   Has had multiple cardioversions and ablations in the past.   - On Eliquis for anticoagulation. Regular heart rate noted on examination. -Continue Eliquis as prescribed.

## 2023-02-01 NOTE — Assessment & Plan Note (Signed)
Diagnosed in 2023 by Levora Angel, MD. Currently on a gluten-free diet. Reports occasional loose stools and leakage with increased intake of greens and salads. -Continue gluten-free diet and other dietary modifications to manage bowel symptoms.

## 2023-02-06 ENCOUNTER — Other Ambulatory Visit: Payer: Self-pay | Admitting: Nurse Practitioner

## 2023-02-09 LAB — HEMOCHROMATOSIS DNA-PCR(C282Y,H63D)

## 2023-02-15 ENCOUNTER — Encounter: Payer: Self-pay | Admitting: Oncology

## 2023-02-15 ENCOUNTER — Inpatient Hospital Stay: Payer: Medicare Other | Attending: Oncology | Admitting: Oncology

## 2023-02-15 DIAGNOSIS — K900 Celiac disease: Secondary | ICD-10-CM

## 2023-02-15 DIAGNOSIS — I480 Paroxysmal atrial fibrillation: Secondary | ICD-10-CM | POA: Diagnosis not present

## 2023-02-15 NOTE — Assessment & Plan Note (Signed)
 Diagnosed in 2023 by Levora Angel, MD. Currently on a gluten-free diet. Reports occasional loose stools and leakage with increased intake of greens and salads. -Continue gluten-free diet and other dietary modifications to manage bowel symptoms.

## 2023-02-15 NOTE — Assessment & Plan Note (Signed)
-  History of diagnosis in the 1990s with subsequent phlebotomies. No recent phlebotomies.  He apparently even had liver biopsy.    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  No evidence of S65C mutation.  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  Ferritin goal will be adjusted depending on rest of the phenotype/liver numbers.  Goal is to maintain ferritin at least below 200 with therapeutic phlebotomies as needed.  -On 02/01/2023, LFTs were normal except for slightly elevated ALT of 48.  Iron saturation was 24%, ferritin was 108.  -Recent cardiac MRI on 01/19/2023 showed no evidence of iron overload in the heart  -I will see him as scheduled in January 2025 and repeat labs including iron studies, LFTs.  If ferritin is approaching 200 or if iron saturation is approaching 50%, then we will plan for therapeutic phlebotomies.

## 2023-02-15 NOTE — Assessment & Plan Note (Signed)
-   Has had multiple cardioversions and ablations in the past.   - On Eliquis for anticoagulation. Regular heart rate noted on examination. -Continue Eliquis as prescribed.

## 2023-02-15 NOTE — Progress Notes (Signed)
[facility]  HEMATOLOGY-ONCOLOGY ELECTRONIC VISIT PROGRESS NOTE  Patient Care Team: Darrow Bussing, MD as PCP - General (Family Medicine) Marinus Maw, MD as PCP - Electrophysiology (Cardiology) Othella Boyer, MD as Consulting Physician (Cardiology) Little Ishikawa, MD as Consulting Physician (Cardiology)  I connected with the patient via telephone conference and verified that I am speaking with the correct person using two identifiers. The patient's location is at home and I am providing care from the [facility].  I discussed the limitations, risks, security and privacy concerns of performing an evaluation and management service by e-visits and the availability of in person appointments.  I also discussed with the patient that there may be a patient responsible charge related to this service. The patient expressed understanding and agreed to proceed.   ASSESSMENT & PLAN:   82 y.o. gentleman with a past medical history of paroxysmal atrial fibrillation, ascending aortic aneurysm, hypertension, dyslipidemia, CAD, unspecified type of hemochromatosis, cerebral aneurysm status post coil embolization in [facility] in May 2006, was referred to our service in October 2024 for evaluation of hemochromatosis.    Hemochromatosis -History of diagnosis in the 1990s with subsequent phlebotomies. No recent phlebotomies.  He apparently even had liver biopsy.    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  No evidence of S65C mutation.  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  Ferritin goal will be adjusted depending on rest of the phenotype/liver numbers.  Goal is to maintain ferritin at least below 200 with therapeutic phlebotomies as needed.  -On 02/01/2023, LFTs were normal except for slightly elevated ALT of 48.  Iron saturation was 24%,  ferritin was 108.  -Recent cardiac MRI on 01/19/2023 showed no evidence of iron overload in the heart  -I will see him as scheduled in January 2025 and repeat labs including iron studies, LFTs.  If ferritin is approaching 200 or if iron saturation is approaching 50%, then we will plan for therapeutic phlebotomies.  ACD (adult celiac disease) Diagnosed in 2023 by Brahmbhatt, MD. Currently on a gluten-free diet. Reports occasional loose stools and leakage with increased intake of greens and salads. -Continue gluten-free diet and other dietary modifications to manage bowel symptoms.  Atrial fibrillation (HCC) - Has had multiple cardioversions and ablations in the past.   - On Eliquis for anticoagulation. Regular heart rate noted on examination. -Continue Eliquis as prescribed.   Orders Placed This Encounter  Procedures   Comprehensive metabolic panel    Standing Status:   Future    Standing Expiration Date:   02/15/2024   CBC with Differential/Platelet    Standing Status:   Future    Standing Expiration Date:   02/15/2024   Iron and Iron Binding Capacity ([facility] only)    Standing Status:   Future    Standing Expiration Date:   02/15/2024   Ferritin    Standing Status:   Future    Standing Expiration Date:   02/15/2024    INTERVAL HISTORY:  Please see above for problem oriented charting.  The purpose of today's discussion is to explain recent lab results and formulate plan of care.  He has been doing fairly well and denies any new complaints compared to last visit.  HEMATOLOGY HISTORY:  82 y.o. gentleman with a past medical history of paroxysmal atrial fibrillation, ascending aortic aneurysm, hypertension, dyslipidemia, CAD, unspecified type of hemochromatosis, cerebral aneurysm status post coil embolization  in [facility] in May 2006, was referred to our service in October 2024 for evaluation of hemochromatosis.     Patient was apparently diagnosed with hemochromatosis in the  10s when he was living in [place]. The patient was initially managed with weekly phlebotomies, which gradually decreased in frequency.  Last phlebotomy was few years ago.  The patient also has a history of heart issues, including atrial fibrillation, for which he has had ablations and cardioversions. The patient reports that his heart rate has been regular recently.  He was recently seen by his cardiologist Dr. Bjorn Pippin who referred him to [place] for his history of hemochromatosis.     The patient was diagnosed with celiac disease in 2023 and has been managing it with dietary modifications. The patient reports some issues with bowel movements, particularly when consuming a lot of greens and salads. The patient also reports some leg swelling, which he attributes to an increase in his amlodipine dosage.  On his initial consultation with [place] on 02/01/2023, hemochromatosis testing revealed heterozygosity for C282Y and H63D mutations.  No evidence of S65C mutation.  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  Ferritin goal will be adjusted depending on rest of the phenotype/liver numbers.  Goal is to maintain ferritin at least below 200 with therapeutic phlebotomies as needed.  On 02/01/2023, LFTs were normal except for slightly elevated ALT of 48.  Iron saturation was 24%, ferritin was 108.  Recent cardiac MRI on 01/19/2023 showed no evidence of iron overload in the heart   REVIEW OF SYSTEMS:    Review of Systems - Oncology  All other pertinent systems were reviewed with the patient and are negative.  I have reviewed the past medical history, past surgical history, social history and family history with the patient and they are unchanged from previous note.  ALLERGIES:  He is allergic to codeine and sulfa antibiotics.  MEDICATIONS:  Current Outpatient Medications  Medication Sig Dispense Refill   acetaminophen (TYLENOL) 500 MG tablet Take 1,000 mg by  mouth every 6 (six) hours as needed. pain  (Patient not taking: Reported on 02/01/2023)     allopurinol (ZYLOPRIM) 300 MG tablet Take 300 mg by mouth daily.       amLODipine (NORVASC) 10 MG tablet Take 1 tablet (10 mg total) by mouth daily. 90 tablet 3   Ascorbic Acid (VITAMIN C PO) Take 1 tablet by mouth daily.     atorvastatin (LIPITOR) 10 MG tablet TAKE 1 TABLET BY MOUTH EVERY DAY 90 tablet 0   chlorpheniramine (CHLOR-TRIMETON) 4 MG tablet Take 4 mg by mouth 2 (two) times daily as needed. allergies      clobetasol cream (TEMOVATE) 0.05 % Apply topically daily.     ELIQUIS 5 MG TABS tablet TAKE 1 TABLET TWICE A DAY 180 tablet 1   ketoconazole (NIZORAL) 2 % cream Apply topically daily.     metoprolol succinate (TOPROL-XL) 50 MG 24 hr tablet TAKE 1 TABLET BY MOUTH EVERY DAY WITH OR IMMEDIATELY FOLLOWING A MEAL 90 tablet 3   multivitamin-lutein (OCUVITE-LUTEIN) CAPS Take 1 capsule by mouth daily.       Omega-3 Fatty Acids (FISH OIL PO) Take 1 Capful by mouth daily.     triamcinolone cream (KENALOG) 0.1 % as needed.     valsartan (DIOVAN) 320 MG tablet Take 320 mg by mouth daily.     VITAMIN D, ERGOCALCIFEROL, PO Take 1 Capful by mouth daily.     No current facility-administered medications for  this visit.    PHYSICAL EXAMINATION: ECOG PERFORMANCE STATUS: 1 - Symptomatic but completely ambulatory  LABORATORY DATA:   I have reviewed the data as listed.  Recent Results (from the past 2160 hour(s))  B Nat Peptide     Status: None   Collection Time: 01/05/23 10:32 AM  Result Value Ref Range   BNP 64.4 0.0 - 100.0 pg/mL    Comment: Siemens ADVIA Centaur XP methodology  CBC w/Diff/Platelet     Status: Abnormal   Collection Time: 01/05/23 10:32 AM  Result Value Ref Range   WBC 6.4 3.4 - 10.8 x10E3/uL   RBC 4.88 4.14 - 5.80 x10E6/uL   Hemoglobin 16.5 13.0 - 17.7 g/dL   Hematocrit 16.1 09.6 - 51.0 %   MCV 101 (H) 79 - 97 fL   MCH 33.8 (H) 26.6 - 33.0 pg   MCHC 33.5 31.5 - 35.7 g/dL    RDW 04.5 40.9 - 81.1 %   Platelets 171 150 - 450 x10E3/uL   Neutrophils 68 Not Estab. %   Lymphs 17 Not Estab. %   Monocytes 12 Not Estab. %   Eos 2 Not Estab. %   Basos 1 Not Estab. %   Neutrophils Absolute 4.3 1.4 - 7.0 x10E3/uL   Lymphocytes Absolute 1.1 0.7 - 3.1 x10E3/uL   Monocytes Absolute 0.8 0.1 - 0.9 x10E3/uL   EOS (ABSOLUTE) 0.1 0.0 - 0.4 x10E3/uL   Basophils Absolute 0.0 0.0 - 0.2 x10E3/uL   Immature Granulocytes 0 Not Estab. %   Immature Grans (Abs) 0.0 0.0 - 0.1 x10E3/uL  Fe+TIBC+Fer     Status: None   Collection Time: 01/05/23 10:32 AM  Result Value Ref Range   Total Iron Binding Capacity 307 250 - 450 ug/dL   UIBC 914 782 - 956 ug/dL   Iron 213 38 - 086 ug/dL   Iron Saturation 49 15 - 55 %   Ferritin 174 30 - 400 ng/mL  Comprehensive Metabolic Panel (CMET)     Status: Abnormal   Collection Time: 01/05/23 10:32 AM  Result Value Ref Range   Glucose 153 (H) 70 - 99 mg/dL   BUN 19 8 - 27 mg/dL   Creatinine, Ser 5.78 0.76 - 1.27 mg/dL   eGFR 91 >46 NG/EXB/2.84   BUN/Creatinine Ratio 25 (H) 10 - 24   Sodium 140 134 - 144 mmol/L   Potassium 4.5 3.5 - 5.2 mmol/L   Chloride 101 96 - 106 mmol/L   CO2 26 20 - 29 mmol/L   Calcium 9.2 8.6 - 10.2 mg/dL   Total Protein 7.4 6.0 - 8.5 g/dL   Albumin 4.7 3.8 - 4.8 g/dL   Globulin, Total 2.7 1.5 - 4.5 g/dL   Bilirubin Total 1.3 (H) 0.0 - 1.2 mg/dL   Alkaline Phosphatase 112 44 - 121 IU/L   AST 46 (H) 0 - 40 IU/L   ALT 51 (H) 0 - 44 IU/L  Folate     Status: None   Collection Time: 02/01/23 10:07 AM  Result Value Ref Range   Folate 24.0 >5.9 ng/mL    Comment: Performed at [facility], [address], [place], [place] [address]  Vitamin B12     Status: None   Collection Time: 02/01/23 10:07 AM  Result Value Ref Range   Vitamin B-12 322 180 - 914 pg/mL    Comment: (NOTE) This assay is not validated for testing neonatal or myeloproliferative syndrome specimens for Vitamin B12 levels. Performed at [facility]  Lab, [address] [place], [place] [address]   Lactate dehydrogenase     Status: None   Collection Time: 02/01/23 10:07 AM  Result Value Ref Range   LDH 180 98 - 192 U/L    Comment: Performed at [facility], [address] [place], [place] [address]  Iron and Iron Binding Capacity ([facility] only)     Status: None   Collection Time: 02/01/23 10:07 AM  Result Value Ref Range   Iron 79 45 - 182 ug/dL   TIBC 295 621 - 308 ug/dL   Saturation Ratios 24 17.9 - 39.5 %   UIBC 246 ug/dL    Comment: Performed at [facility], [address] [place], [place] [address]  Ferritin     Status: None   Collection Time: 02/01/23 10:07 AM  Result Value Ref Range   Ferritin 108 24 - 336 ng/mL    Comment: Performed at [facility], [address] [place], [place] [address]  Comprehensive metabolic panel     Status: Abnormal   Collection Time: 02/01/23 10:07 AM  Result Value Ref Range   Sodium 137 135 - 145 mmol/L   Potassium 3.9 3.5 - 5.1 mmol/L   Chloride 102 98 - 111 mmol/L   CO2 30 22 - 32 mmol/L   Glucose, Bld 212 (H) 70 - 99 mg/dL    Comment: Glucose reference range applies only to samples taken after fasting for at least 8 hours.   BUN 17 8 - 23 mg/dL   Creatinine, Ser 5.28 0.61 - 1.24 mg/dL   Calcium 9.8 8.9 - 41.3 mg/dL   Total Protein 7.3 6.5 - 8.1 g/dL   Albumin 4.7 3.5 - 5.0 g/dL   AST 39 15 - 41 U/L   ALT 48 (H) 0 - 44 U/L   Alkaline Phosphatase 111 38 - 126 U/L   Total Bilirubin 1.0 0.3 - 1.2 mg/dL   GFR, Estimated >24 >40 mL/min    Comment: (NOTE) Calculated using the CKD-EPI Creatinine Equation (2021)    Anion gap 5 5 - 15    Comment: Performed at [facility], [address] [place], [place] [address]  CBC with Differential/Platelet     Status: Abnormal   Collection Time: 02/01/23 10:07 AM  Result Value Ref Range   WBC 6.0 4.0 - 10.5 K/uL   RBC 4.77 4.22 - 5.81 MIL/uL   Hemoglobin 15.8 13.0 -  17.0 g/dL   HCT 53.6 64.4 - 03.4 %   MCV 97.3 80.0 - 100.0 fL   MCH 33.1 26.0 - 34.0 pg   MCHC 34.1 30.0 - 36.0 g/dL   RDW 74.2 59.5 - 63.8 %   Platelets 148 (L) 150 - 400 K/uL   nRBC 0.0 0.0 - 0.2 %   Neutrophils Relative % 71 %   Neutro Abs 4.2 1.7 - 7.7 K/uL   Lymphocytes Relative 15 %   Lymphs Abs 0.9 0.7 - 4.0 K/uL   Monocytes Relative 11 %   Monocytes Absolute 0.7 0.1 - 1.0 K/uL   Eosinophils Relative 2 %   Eosinophils Absolute 0.1 0.0 - 0.5 K/uL   Basophils Relative 1 %   Basophils Absolute 0.0 0.0 - 0.1 K/uL   Immature Granulocytes 0 %   Abs Immature Granulocytes 0.02 0.00 - 0.07 K/uL    Comment: Performed at [facility], [address] [place], [place] [address]  Hemochromatosis DNA, PCR     Status: None   Collection  Time: 02/01/23 10:07 AM  Result Value Ref Range   DNA Mutation Analysis Comment     Comment: (NOTE) Results: c.845G>A (p.Cys282Tyr) - Detected, heterozygous c.187C>G (p.His63Asp) - Detected, heterozygous c.193A>T (p.Ser65Cys) - Not Detected Associated with a low risk to develop clinically relevant symptoms of hereditary hemochromatosis. Biochemical testing such as transferrin-iron saturation and/or serum ferritin studies is recommended to confirm a diagnosis. See Additional Information and Comments. Additional Clinical Information: Hereditary hemochromatosis (HFE related) is an autosomal recessive iron storage disorder. Patients may have a genetic diagnosis of hereditary hemochromatosis and never show clinical symptoms. Clinical symptoms typically appear between 40 to 60 years in males and after menopause in females. Signs and symptoms may include organ damage, primarily in the liver, risk for hepatocellular carcinoma, diabetes, and heart disease due to iron accumulation. Life expectancy may be decreased in individuals who develop cirrhosis. Treatment for cl inically symptomatic individuals may include therapeutic phlebotomy.  Liver transplant may be used to treat end stage liver failure. For preventive care, monitoring for iron overload is recommended for patients who are homozygous for c.845G>A (p.Cys282Tyr) and have yet to experience clinical symptoms. Comments: The most common HFE variants associated with hereditary hemochromatosis are c.845G>A (p.Cys282Tyr), c.187C>G (p.His63Asp), c.193A>T (p.Ser65Cys). While patients homozygous for c.845G>A (p.Cys282Tyr) are the most likely to present clinical symptoms, less than 10% develop clinically significant iron overload with tissue and organ damage. Genetic counseling is recommended to discuss the potential clinical implications of positive results, as well as recommendations for testing family members. Genetic Coordinators are available for health care providers to discuss results at [phone]). Test Details: Three variants analyzed: c.845G>A (p.Cys282Tyr), commo nly referred to as C282Y c.187C>G (p.His63Asp), commonly referred to as H63D c.193A>T (p.Ser65Cys), commonly referred to as S65C Methods/Limitations: DNA Analysis of the HFE gene (NM_[id number]) was performed by PCR amplification followed by restriction enzyme digestion analyses. Results must be combined with clinical information for the most accurate interpretation. Molecular-based testing is highly accurate, but as in any laboratory test, diagnostic errors may occur. False positive or false negative results may occur for reasons that include genetic variants, blood transfusions, bone marrow transplantation, somatic or tissue-specific mosaicism, mislabeled samples, or erroneous representation of family relationships. This test was developed and its performance characteristics determined by [facility]. It has not been cleared or approved by the Food and Drug Administration. References: Kennyth Arnold, [address] [place]dley Clarnce Flock [place], [place]; American Association for the Study  of Liver  Diseases. Diagnosis and management of hemochromatosis: 2011 practice guideline by the American Association for the Study of Liver Diseases. Hepatology. 2011 Jul;54(1):328-43. doi: 10.1002/hep.24330. PMID: 84696295; PMCID: MWU1324401. [address], Brissot P, Swinkels DW, [employer], Kamarainen O, Patton S, Alonso I, Morris M, Keeney S. EMQN best practice guidelines for the molecular genetic diagnosis of hereditary hemochromatosis [facility]). Eur J Hum Genet. 2016 Apr;24(4):479-95. doi: 10.1038/ejhg.2015.128. Epub 2015 Jul 8. PMID: 02725366; PMCID: YQI3474259.    Reviewed by: Comment     Comment: (NOTE) Technical Component performed at [employer] [facility] Professional Component performed by: [employer] Lowella Dell, Ph.D., [facility] Director, Molecular Genetics [address] [place] [address] Performed At: [facility] [address] [place], [place] [id number] Maurine Simmering MDPhD OA:[phone]      RADIOGRAPHIC STUDIES:  I have personally reviewed the radiological images as listed and agree with the findings in the report.  MR CARDIAC MORPHOLOGY W WO CONTRAST  Result Date: 01/31/2023 CLINICAL DATA:  Hemochromatosis; Aortic aneurysm suspected AAA EXAM: MR CARDIA  MORPHOLOGY WITHOUT AND WITH CONTRAST; MR CARDIAC VELOCITY FLOW MAPPING; MRA CHEST WITH OR WITHOUT CONTRAST TECHNIQUE: The patient was scanned on a 1.5 Tesla Siemens magnet. A dedicated cardiac coil was used. Functional imaging was done using TrueFisp sequences. 2,3, and 4 chamber views were done to assess for RWMA's. Modified Simpson's rule using a short axis stack was used to calculate an ejection fraction on a dedicated work [employer]. The patient received 10mL GADAVIST GADOBUTROL 1 MMOL/ML IV SOLN. After 10 minutes inversion recovery sequences were used to assess for infiltration and scar tissue. Phase contrast velocity encoded images obtained at two levels at . This examination is tailored  for evaluation cardiac anatomy and function and provides very limited assessment of noncardiac structures, which are accordingly not evaluated during interpretation. If there is clinical concern for extracardiac pathology, further evaluation with CT imaging should be considered. FINDINGS: LEFT VENTRICLE: Normal left ventricular chamber size. Moderately increased left ventricular wall thickness. Maximal wall thickness 15 mm in the basal-mid septal wall. Normal left ventricular systolic function. LVEF = 56% Regional wall motion abnormalities: There is mild hypokinesis of the mid inferolateral wall. No myocardial edema, T2 = 52 msec There is post contrast delayed myocardial enhancement: Unchanged pattern of LGE in the lateral wall. Basal and mid inferolateral wall LGE subendocardial and midmyocardial, >50% wall thickness, and basal anterolateral wall subendocardial LGE <50% wall thickness. Most consistent with prior infarct. There is also basal to mid inferior RV insertion point LGE which may indicate increased pulmonary pressures. Normal T1 myocardial nulling kinetics suggest against a diagnosis of cardiac amyloidosis. ECV = 33%, nonspecific elevation. T2* values: 40 ms base, 36 ms mid, 38 ms apex. No evidence of iron overload. RIGHT VENTRICLE: Normal right ventricular chamber size. Normal right ventricular wall thickness. Normal right ventricular systolic function. RVEF = 59% There are no regional wall motion abnormalities. No post contrast delayed myocardial enhancement. AORTA: Sinus of Valsalva: R-L: 41 mm L-Non: 39 mm R-Non: 43 mm Mid ascending aorta (at PA bifurcation): 42 mm Arch (between left common carotid and left subclavian arteries): 30 mm Proximal descending aorta: 36 mm Mid descending aorta (at level of pulmonary veins): 30 mm Distal descending aorta (just proximal to aortic hiatus): 28 mm ATRIA: Left atrium: Moderately dilated Right atrium: normal VALVES: No significant valvular abnormalities.  PERICARDIUM: Normal pericardium.  Trivial pericardial effusion. OTHER: No significant extracardiac findings. MEASUREMENTS: Respiratory motion artifact present, may render measurements approximate values. Qp/Qs: Not performed. Aortic valve regurgitation: None, regurgitant fraction 0% Pulmonary valve regurgitation: Not calculated Mitral valve regurgitation: mild, regurgitant fraction 16% Tricuspid valve regurgitation: Not calculated Left ventricle: LV male LV EF: 56% (normal 49-79%) Absolute volumes: LV EDV: (normal 95-215 mL) LV ESV: 83mL (Normal 25-85 mL) LV SV: (Normal 61-145 mL) CO: 5.8L/min (Normal 3.4-7.8 L/min) Indexed volumes: LV EDV: 66mL/sq-m (Normal 50-108 mL/sq-m) LV ESV: 43mL/sq-m (Normal 11-47 mL/sq-m) LV SV: 28mL/sq-m (Normal 33-72 mL/sq-m) CI: 2.73L/min/sq-m (Normal 1.8-4.2 L/min/sq-m) Right ventricle: RV male RV EF:  59% (Normal 51-80%) Absolute volumes: RV EDV: (Normal 109-217 mL) RV ESV: 73mL (Normal 23-91 mL) RV SV: (Normal 71-141 mL) CO: 5.8L/min (Normal 2.8-8.8 L/min) Indexed volumes: RV EDV: 33mL/sq-m (Normal 58-109 mL/sq-m) RV ESV: 38mL/sq-m (Normal 12-46 mL/sq-m) RV SV: 54mL/sq-m (Normal 38-71 mL/sq-m) CI: 2.74L/min/sq-m (Normal 1.7-4.2 L/min/sq-m) IMPRESSION: 1. Normal LV systolic function, normal LV chamber size. LVEF 56%. Moderate increase in LV wall thickness, 15 mm in the septum. Mild hypokinesis of the mid inferolateral wall. Unchanged pattern of  delayed myocardial enhancement in the basal anterolateral and basal-mid inferolateral wall most consistent with prior infarct. 2.  No evidence of myocardial iron overload, average T2* 38 ms. 3. Normal right ventricular chamber size and function, RVEF 59%. There is basal to mid inferior RV insertion point LGE which may indicate increased pulmonary pressures. 4. Ascending aorta dilation, mild dilation of sinus of Valsalva, 43 mm, mid ascending aorta 42 mm, and proximal descending aorta 36 mm. 5.  Mild mitral valve  regurgitation, MR regurgitant fraction 16%. Electronically Signed   By: Weston Brass M.D.   On: 01/31/2023 12:58   MR Angiogram Chest W Wo Contrast  Result Date: 01/31/2023 CLINICAL DATA:  Hemochromatosis; Aortic aneurysm suspected AAA EXAM: MR CARDIA MORPHOLOGY WITHOUT AND WITH CONTRAST; MR CARDIAC VELOCITY FLOW MAPPING; MRA CHEST WITH OR WITHOUT CONTRAST TECHNIQUE: The patient was scanned on a 1.5 Tesla Siemens magnet. A dedicated cardiac coil was used. Functional imaging was done using TrueFisp sequences. 2,3, and 4 chamber views were done to assess for RWMA's. Modified Simpson's rule using a short axis stack was used to calculate an ejection fraction on a dedicated work [employer], [employer]. The patient received 10mL GADAVIST GADOBUTROL 1 MMOL/ML IV SOLN. After 10 minutes inversion recovery sequences were used to assess for infiltration and scar tissue. Phase contrast velocity encoded images obtained at two levels at . This examination is tailored for evaluation cardiac anatomy and function and provides very limited assessment of noncardiac structures, which are accordingly not evaluated during interpretation. If there is clinical concern for extracardiac pathology, further evaluation with CT imaging should be considered. FINDINGS: LEFT VENTRICLE: Normal left ventricular chamber size. Moderately increased left ventricular wall thickness. Maximal wall thickness 15 mm in the basal-mid septal wall. Normal left ventricular systolic function. LVEF = 56% Regional wall motion abnormalities: There is mild hypokinesis of the mid inferolateral wall. No myocardial edema, T2 = 52 msec There is post contrast delayed myocardial enhancement: Unchanged pattern of LGE in the lateral wall. Basal and mid inferolateral wall LGE subendocardial and midmyocardial, >50% wall thickness, and basal anterolateral wall subendocardial LGE <50% wall thickness. Most consistent with prior infarct. There is also basal to mid  inferior RV insertion point LGE which may indicate increased pulmonary pressures. Normal T1 myocardial nulling kinetics suggest against a diagnosis of cardiac amyloidosis. ECV = 33%, nonspecific elevation. T2* values: 40 ms base, 36 ms mid, 38 ms apex. No evidence of iron overload. RIGHT VENTRICLE: Normal right ventricular chamber size. Normal right ventricular wall thickness. Normal right ventricular systolic function. RVEF = 59% There are no regional wall motion abnormalities. No post contrast delayed myocardial enhancement. AORTA: Sinus of Valsalva: R-L: 41 mm L-Non: 39 mm R-Non: 43 mm Mid ascending aorta (at PA bifurcation): 42 mm Arch (between left common carotid and left subclavian arteries): 30 mm Proximal descending aorta: 36 mm Mid descending aorta (at level of pulmonary veins): 30 mm Distal descending aorta (just proximal to aortic hiatus): 28 mm ATRIA: Left atrium: Moderately dilated Right atrium: normal VALVES: No significant valvular abnormalities. PERICARDIUM: Normal pericardium.  Trivial pericardial effusion. OTHER: No significant extracardiac findings. MEASUREMENTS: Respiratory motion artifact present, may render measurements approximate values. Qp/Qs: Not performed. Aortic valve regurgitation: None, regurgitant fraction 0% Pulmonary valve regurgitation: Not calculated Mitral valve regurgitation: mild, regurgitant fraction 16% Tricuspid valve regurgitation: Not calculated Left ventricle: LV male LV EF: 56% (normal 49-79%) Absolute volumes: LV EDV: (normal 95-215 mL) LV ESV: 83mL (Normal 25-85 mL) LV SV: (Normal 61-145  mL) CO: 5.8L/min (Normal 3.4-7.8 L/min) Indexed volumes: LV EDV: 65mL/sq-m (Normal 50-108 mL/sq-m) LV ESV: 86mL/sq-m (Normal 11-47 mL/sq-m) LV SV: 71mL/sq-m (Normal 33-72 mL/sq-m) CI: 2.73L/min/sq-m (Normal 1.8-4.2 L/min/sq-m) Right ventricle: RV male RV EF:  59% (Normal 51-80%) Absolute volumes: RV EDV: (Normal 109-217 mL) RV ESV: 73mL (Normal 23-91 mL) RV SV:  (Normal 71-141 mL) CO: 5.8L/min (Normal 2.8-8.8 L/min) Indexed volumes: RV EDV: 83mL/sq-m (Normal 58-109 mL/sq-m) RV ESV: 41mL/sq-m (Normal 12-46 mL/sq-m) RV SV: 27mL/sq-m (Normal 38-71 mL/sq-m) CI: 2.74L/min/sq-m (Normal 1.7-4.2 L/min/sq-m) IMPRESSION: 1. Normal LV systolic function, normal LV chamber size. LVEF 56%. Moderate increase in LV wall thickness, 15 mm in the septum. Mild hypokinesis of the mid inferolateral wall. Unchanged pattern of delayed myocardial enhancement in the basal anterolateral and basal-mid inferolateral wall most consistent with prior infarct. 2.  No evidence of myocardial iron overload, average T2* 38 ms. 3. Normal right ventricular chamber size and function, RVEF 59%. There is basal to mid inferior RV insertion point LGE which may indicate increased pulmonary pressures. 4. Ascending aorta dilation, mild dilation of sinus of Valsalva, 43 mm, mid ascending aorta 42 mm, and proximal descending aorta 36 mm. 5.  Mild mitral valve regurgitation, MR regurgitant fraction 16%. Electronically Signed   By: Weston Brass M.D.   On: 01/31/2023 12:58   MR CARDIAC VELOCITY FLOW MAP  Result Date: 01/31/2023 CLINICAL DATA:  Hemochromatosis; Aortic aneurysm suspected AAA EXAM: MR CARDIA MORPHOLOGY WITHOUT AND WITH CONTRAST; MR CARDIAC VELOCITY FLOW MAPPING; MRA CHEST WITH OR WITHOUT CONTRAST TECHNIQUE: The patient was scanned on a 1.5 Tesla Siemens magnet. A dedicated cardiac coil was used. Functional imaging was done using TrueFisp sequences. 2,3, and 4 chamber views were done to assess for RWMA's. Modified Simpson's rule using a short axis stack was used to calculate an ejection fraction on a dedicated work [employer]. The patient received 10mL GADAVIST GADOBUTROL 1 MMOL/ML IV SOLN. After 10 minutes inversion recovery sequences were used to assess for infiltration and scar tissue. Phase contrast velocity encoded images obtained at two levels at . This examination is tailored  for evaluation cardiac anatomy and function and provides very limited assessment of noncardiac structures, which are accordingly not evaluated during interpretation. If there is clinical concern for extracardiac pathology, further evaluation with CT imaging should be considered. FINDINGS: LEFT VENTRICLE: Normal left ventricular chamber size. Moderately increased left ventricular wall thickness. Maximal wall thickness 15 mm in the basal-mid septal wall. Normal left ventricular systolic function. LVEF = 56% Regional wall motion abnormalities: There is mild hypokinesis of the mid inferolateral wall. No myocardial edema, T2 = 52 msec There is post contrast delayed myocardial enhancement: Unchanged pattern of LGE in the lateral wall. Basal and mid inferolateral wall LGE subendocardial and midmyocardial, >50% wall thickness, and basal anterolateral wall subendocardial LGE <50% wall thickness. Most consistent with prior infarct. There is also basal to mid inferior RV insertion point LGE which may indicate increased pulmonary pressures. Normal T1 myocardial nulling kinetics suggest against a diagnosis of cardiac amyloidosis. ECV = 33%, nonspecific elevation. T2* values: 40 ms base, 36 ms mid, 38 ms apex. No evidence of iron overload. RIGHT VENTRICLE: Normal right ventricular chamber size. Normal right ventricular wall thickness. Normal right ventricular systolic function. RVEF = 59% There are no regional wall motion abnormalities. No post contrast delayed myocardial enhancement. AORTA: Sinus of Valsalva: R-L: 41 mm L-Non: 39 mm R-Non: 43 mm Mid ascending aorta (at PA bifurcation): 42 mm Arch (between left  common carotid and left subclavian arteries): 30 mm Proximal descending aorta: 36 mm Mid descending aorta (at level of pulmonary veins): 30 mm Distal descending aorta (just proximal to aortic hiatus): 28 mm ATRIA: Left atrium: Moderately dilated Right atrium: normal VALVES: No significant valvular abnormalities.  PERICARDIUM: Normal pericardium.  Trivial pericardial effusion. OTHER: No significant extracardiac findings. MEASUREMENTS: Respiratory motion artifact present, may render measurements approximate values. Qp/Qs: Not performed. Aortic valve regurgitation: None, regurgitant fraction 0% Pulmonary valve regurgitation: Not calculated Mitral valve regurgitation: mild, regurgitant fraction 16% Tricuspid valve regurgitation: Not calculated Left ventricle: LV male LV EF: 56% (normal 49-79%) Absolute volumes: LV EDV: (normal 95-215 mL) LV ESV: 83mL (Normal 25-85 mL) LV SV: (Normal 61-145 mL) CO: 5.8L/min (Normal 3.4-7.8 L/min) Indexed volumes: LV EDV: 39mL/sq-m (Normal 50-108 mL/sq-m) LV ESV: 46mL/sq-m (Normal 11-47 mL/sq-m) LV SV: 52mL/sq-m (Normal 33-72 mL/sq-m) CI: 2.73L/min/sq-m (Normal 1.8-4.2 L/min/sq-m) Right ventricle: RV male RV EF:  59% (Normal 51-80%) Absolute volumes: RV EDV: (Normal 109-217 mL) RV ESV: 73mL (Normal 23-91 mL) RV SV: (Normal 71-141 mL) CO: 5.8L/min (Normal 2.8-8.8 L/min) Indexed volumes: RV EDV: 42mL/sq-m (Normal 58-109 mL/sq-m) RV ESV: 46mL/sq-m (Normal 12-46 mL/sq-m) RV SV: 18mL/sq-m (Normal 38-71 mL/sq-m) CI: 2.74L/min/sq-m (Normal 1.7-4.2 L/min/sq-m) IMPRESSION: 1. Normal LV systolic function, normal LV chamber size. LVEF 56%. Moderate increase in LV wall thickness, 15 mm in the septum. Mild hypokinesis of the mid inferolateral wall. Unchanged pattern of delayed myocardial enhancement in the basal anterolateral and basal-mid inferolateral wall most consistent with prior infarct. 2.  No evidence of myocardial iron overload, average T2* 38 ms. 3. Normal right ventricular chamber size and function, RVEF 59%. There is basal to mid inferior RV insertion point LGE which may indicate increased pulmonary pressures. 4. Ascending aorta dilation, mild dilation of sinus of Valsalva, 43 mm, mid ascending aorta 42 mm, and proximal descending aorta 36 mm. 5.  Mild mitral valve  regurgitation, MR regurgitant fraction 16%. Electronically Signed   By: Weston Brass M.D.   On: 01/31/2023 12:58   MR CARDIAC VELOCITY FLOW MAP  Result Date: 01/31/2023 CLINICAL DATA:  Hemochromatosis; Aortic aneurysm suspected AAA EXAM: MR CARDIA MORPHOLOGY WITHOUT AND WITH CONTRAST; MR CARDIAC VELOCITY FLOW MAPPING; MRA CHEST WITH OR WITHOUT CONTRAST TECHNIQUE: The patient was scanned on a 1.5 Tesla Siemens magnet. A dedicated cardiac coil was used. Functional imaging was done using TrueFisp sequences. 2,3, and 4 chamber views were done to assess for RWMA's. Modified Simpson's rule using a short axis stack was used to calculate an ejection fraction on a dedicated work [employer]. The patient received 10mL GADAVIST GADOBUTROL 1 MMOL/ML IV SOLN. After 10 minutes inversion recovery sequences were used to assess for infiltration and scar tissue. Phase contrast velocity encoded images obtained at two levels at . This examination is tailored for evaluation cardiac anatomy and function and provides very limited assessment of noncardiac structures, which are accordingly not evaluated during interpretation. If there is clinical concern for extracardiac pathology, further evaluation with CT imaging should be considered. FINDINGS: LEFT VENTRICLE: Normal left ventricular chamber size. Moderately increased left ventricular wall thickness. Maximal wall thickness 15 mm in the basal-mid septal wall. Normal left ventricular systolic function. LVEF = 56% Regional wall motion abnormalities: There is mild hypokinesis of the mid inferolateral wall. No myocardial edema, T2 = 52 msec There is post contrast delayed myocardial enhancement: Unchanged pattern of LGE in the lateral wall. Basal and mid inferolateral wall LGE subendocardial and midmyocardial, >50%  wall thickness, and basal anterolateral wall subendocardial LGE <50% wall thickness. Most consistent with prior infarct. There is also basal to mid  inferior RV insertion point LGE which may indicate increased pulmonary pressures. Normal T1 myocardial nulling kinetics suggest against a diagnosis of cardiac amyloidosis. ECV = 33%, nonspecific elevation. T2* values: 40 ms base, 36 ms mid, 38 ms apex. No evidence of iron overload. RIGHT VENTRICLE: Normal right ventricular chamber size. Normal right ventricular wall thickness. Normal right ventricular systolic function. RVEF = 59% There are no regional wall motion abnormalities. No post contrast delayed myocardial enhancement. AORTA: Sinus of Valsalva: R-L: 41 mm L-Non: 39 mm R-Non: 43 mm Mid ascending aorta (at PA bifurcation): 42 mm Arch (between left common carotid and left subclavian arteries): 30 mm Proximal descending aorta: 36 mm Mid descending aorta (at level of pulmonary veins): 30 mm Distal descending aorta (just proximal to aortic hiatus): 28 mm ATRIA: Left atrium: Moderately dilated Right atrium: normal VALVES: No significant valvular abnormalities. PERICARDIUM: Normal pericardium.  Trivial pericardial effusion. OTHER: No significant extracardiac findings. MEASUREMENTS: Respiratory motion artifact present, may render measurements approximate values. Qp/Qs: Not performed. Aortic valve regurgitation: None, regurgitant fraction 0% Pulmonary valve regurgitation: Not calculated Mitral valve regurgitation: mild, regurgitant fraction 16% Tricuspid valve regurgitation: Not calculated Left ventricle: LV male LV EF: 56% (normal 49-79%) Absolute volumes: LV EDV: (normal 95-215 mL) LV ESV: 83mL (Normal 25-85 mL) LV SV: (Normal 61-145 mL) CO: 5.8L/min (Normal 3.4-7.8 L/min) Indexed volumes: LV EDV: 25mL/sq-m (Normal 50-108 mL/sq-m) LV ESV: 54mL/sq-m (Normal 11-47 mL/sq-m) LV SV: 84mL/sq-m (Normal 33-72 mL/sq-m) CI: 2.73L/min/sq-m (Normal 1.8-4.2 L/min/sq-m) Right ventricle: RV male RV EF:  59% (Normal 51-80%) Absolute volumes: RV EDV: (Normal 109-217 mL) RV ESV: 73mL (Normal 23-91 mL) RV SV:  (Normal 71-141 mL) CO: 5.8L/min (Normal 2.8-8.8 L/min) Indexed volumes: RV EDV: 52mL/sq-m (Normal 58-109 mL/sq-m) RV ESV: 38mL/sq-m (Normal 12-46 mL/sq-m) RV SV: 40mL/sq-m (Normal 38-71 mL/sq-m) CI: 2.74L/min/sq-m (Normal 1.7-4.2 L/min/sq-m) IMPRESSION: 1. Normal LV systolic function, normal LV chamber size. LVEF 56%. Moderate increase in LV wall thickness, 15 mm in the septum. Mild hypokinesis of the mid inferolateral wall. Unchanged pattern of delayed myocardial enhancement in the basal anterolateral and basal-mid inferolateral wall most consistent with prior infarct. 2.  No evidence of myocardial iron overload, average T2* 38 ms. 3. Normal right ventricular chamber size and function, RVEF 59%. There is basal to mid inferior RV insertion point LGE which may indicate increased pulmonary pressures. 4. Ascending aorta dilation, mild dilation of sinus of Valsalva, 43 mm, mid ascending aorta 42 mm, and proximal descending aorta 36 mm. 5.  Mild mitral valve regurgitation, MR regurgitant fraction 16%. Electronically Signed   By: Weston Brass M.D.   On: 01/31/2023 12:58    I discussed the assessment and treatment plan with the patient. The patient was provided an opportunity to ask questions and all were answered. The patient agreed with the plan and demonstrated an understanding of the instructions. The patient was advised to call back or seek an in-person evaluation if the symptoms worsen or if the condition fails to improve as anticipated.    I spent 25 minutes for the appointment reviewing test results, discuss management and coordination of care.  [name], MD 02/15/2023 1:14 PM [facility] [address] [place] [address] Dept: [phone] Dept Fax: [phone]

## 2023-02-23 NOTE — Telephone Encounter (Signed)
Telephone call  

## 2023-03-08 DIAGNOSIS — M546 Pain in thoracic spine: Secondary | ICD-10-CM | POA: Diagnosis not present

## 2023-03-08 DIAGNOSIS — I10 Essential (primary) hypertension: Secondary | ICD-10-CM | POA: Diagnosis not present

## 2023-03-22 ENCOUNTER — Telehealth: Payer: Self-pay | Admitting: Cardiology

## 2023-03-22 NOTE — Telephone Encounter (Signed)
Left vm in attempt to schedule echo (per Dr. Bjorn Pippin) for 01/2024: [phone].  JB, 03-22-23

## 2023-04-02 ENCOUNTER — Other Ambulatory Visit: Payer: Self-pay | Admitting: Internal Medicine

## 2023-05-03 ENCOUNTER — Other Ambulatory Visit: Payer: Medicare Other

## 2023-05-03 ENCOUNTER — Encounter: Payer: Self-pay | Admitting: Oncology

## 2023-05-03 ENCOUNTER — Inpatient Hospital Stay: Payer: Medicare Other | Attending: Oncology | Admitting: Oncology

## 2023-05-03 ENCOUNTER — Inpatient Hospital Stay: Payer: Medicare Other

## 2023-05-03 DIAGNOSIS — I480 Paroxysmal atrial fibrillation: Secondary | ICD-10-CM | POA: Insufficient documentation

## 2023-05-03 DIAGNOSIS — Z79899 Other long term (current) drug therapy: Secondary | ICD-10-CM | POA: Diagnosis not present

## 2023-05-03 DIAGNOSIS — Z7901 Long term (current) use of anticoagulants: Secondary | ICD-10-CM | POA: Diagnosis not present

## 2023-05-03 DIAGNOSIS — K900 Celiac disease: Secondary | ICD-10-CM | POA: Diagnosis not present

## 2023-05-03 DIAGNOSIS — Z87891 Personal history of nicotine dependence: Secondary | ICD-10-CM | POA: Diagnosis not present

## 2023-05-03 DIAGNOSIS — I10 Essential (primary) hypertension: Secondary | ICD-10-CM | POA: Diagnosis not present

## 2023-05-03 LAB — COMPREHENSIVE METABOLIC PANEL
ALT: 41 U/L (ref 0–44)
AST: 33 U/L (ref 15–41)
Albumin: 4.6 g/dL (ref 3.5–5.0)
Alkaline Phosphatase: 85 U/L (ref 38–126)
Anion gap: 8 (ref 5–15)
BUN: 17 mg/dL (ref 8–23)
CO2: 30 mmol/L (ref 22–32)
Calcium: 9.5 mg/dL (ref 8.9–10.3)
Chloride: 101 mmol/L (ref 98–111)
Creatinine, Ser: 0.82 mg/dL (ref 0.61–1.24)
GFR, Estimated: >60 mL/min (ref >60)
Glucose, Bld: 164 mg/dL — ABNORMAL HIGH (ref 70–99)
Potassium: 4.3 mmol/L (ref 3.5–5.1)
Sodium: 139 mmol/L (ref 135–145)
Total Bilirubin: 1.3 mg/dL — ABNORMAL HIGH (ref 0.0–1.2)
Total Protein: 7.9 g/dL (ref 6.5–8.1)

## 2023-05-03 LAB — CBC WITH DIFFERENTIAL/PLATELET
Abs Immature Granulocytes: 0.02 10*3/uL (ref 0.00–0.07)
Basophils Absolute: 0 10*3/uL (ref 0.0–0.1)
Basophils Relative: 0 %
Eosinophils Absolute: 0.3 10*3/uL (ref 0.0–0.5)
Eosinophils Relative: 4 %
HCT: 48.3 % (ref 39.0–52.0)
Hemoglobin: 16.5 g/dL (ref 13.0–17.0)
Immature Granulocytes: 0 %
Lymphocytes Relative: 16 %
Lymphs Abs: 1.2 10*3/uL (ref 0.7–4.0)
MCH: 33.2 pg (ref 26.0–34.0)
MCHC: 34.2 g/dL (ref 30.0–36.0)
MCV: 97.2 fL (ref 80.0–100.0)
Monocytes Absolute: 0.8 10*3/uL (ref 0.1–1.0)
Monocytes Relative: 10 %
Neutro Abs: 5 10*3/uL (ref 1.7–7.7)
Neutrophils Relative %: 70 %
Platelets: 165 10*3/uL (ref 150–400)
RBC: 4.97 MIL/uL (ref 4.22–5.81)
RDW: 13 % (ref 11.5–15.5)
WBC: 7.2 10*3/uL (ref 4.0–10.5)
nRBC: 0 % (ref 0.0–0.2)

## 2023-05-03 LAB — IRON AND TIBC
Iron: 134 ug/dL (ref 45–182)
Saturation Ratios: 40 % — ABNORMAL HIGH (ref 17.9–39.5)
TIBC: 337 ug/dL (ref 250–450)
UIBC: 203 ug/dL

## 2023-05-03 LAB — FERRITIN: Ferritin: 102 ng/mL (ref 24–336)

## 2023-05-03 NOTE — Assessment & Plan Note (Addendum)
-  History of diagnosis in the 1990s with subsequent phlebotomies. No recent phlebotomies.  He apparently even had liver biopsy.    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  No evidence of S65C mutation.  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  Ferritin goal will be adjusted depending on rest of the phenotype/liver numbers.  Goal is to maintain ferritin at least below 200 with therapeutic phlebotomies as needed.  -On 02/01/2023, LFTs were normal except for slightly elevated ALT of 48.  Iron saturation was 24%, ferritin was 108.  -Recent cardiac MRI on 01/19/2023 showed no evidence of iron overload in the heart  -Labs today revealed unremarkable CBCD and CMP.  Ferritin 102.  Iron studies showed iron saturation of 40%, normal iron.  No indication for therapeutic phlebotomy currently.  -I will see him in 4 months with repeat labs including iron studies, LFTs.  If ferritin is approaching 200 or if iron saturation is approaching 50%, then we will plan for therapeutic phlebotomies.  If his labs remain stable, we will slowly space out clinic intervals to every 6 months after his next visit.

## 2023-05-03 NOTE — Assessment & Plan Note (Signed)
Diagnosed in 2023 by Levora Angel, MD. Currently on a gluten-free diet. Reports occasional loose stools and leakage with increased intake of greens and salads. -Continue gluten-free diet and other dietary modifications to manage bowel symptoms.

## 2023-05-03 NOTE — Progress Notes (Signed)
[facility]  HEMATOLOGY CLINIC PROGRESS NOTE  PATIENT NAME: Randy Evans   MR#: [id number] DOB: [birth year]  Patient Care Team: Darrow Bussing, MD as PCP - General (Family Medicine) Marinus Maw, MD as PCP - Electrophysiology (Cardiology) Othella Boyer, MD as Consulting Physician (Cardiology) Little Ishikawa, MD as Consulting Physician (Cardiology)  Date of visit: 05/03/2023   ASSESSMENT & PLAN:   Randy Evans is a 83 y.o. gentleman with a past medical history of paroxysmal atrial fibrillation, ascending aortic aneurysm, hypertension, dyslipidemia, CAD, unspecified type of hemochromatosis, cerebral aneurysm status post coil embolization in [facility] in May 2006, was referred to our service in October 2024 for evaluation of hemochromatosis.  Found to have heterozygosity for C282Y and H63D mutations.  Hereditary hemochromatosis (HCC) -History of diagnosis in the 1990s with subsequent phlebotomies. No recent phlebotomies.  He apparently even had liver biopsy.    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  No evidence of S65C mutation.  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  Ferritin goal will be adjusted depending on rest of the phenotype/liver numbers.  Goal is to maintain ferritin at least below 200 with therapeutic phlebotomies as needed.  -On 02/01/2023, LFTs were normal except for slightly elevated ALT of 48.  Iron saturation was 24%, ferritin was 108.  -Recent cardiac MRI on 01/19/2023 showed no evidence of iron overload in the heart  -Labs today revealed unremarkable CBCD and CMP.  Ferritin 102.  Iron studies showed iron saturation of 40%, normal iron.  No indication for therapeutic phlebotomy currently.  -I will see him in 4 months with repeat labs including iron studies, LFTs.  If ferritin is approaching 200 or  if iron saturation is approaching 50%, then we will plan for therapeutic phlebotomies.  If his labs remain stable, we will slowly space out clinic intervals to every 6 months after his next visit.  ACD (adult celiac disease) Diagnosed in 2023 by Brahmbhatt, MD. Currently on a gluten-free diet. Reports occasional loose stools and leakage with increased intake of greens and salads. -Continue gluten-free diet and other dietary modifications to manage bowel symptoms.  Atrial fibrillation (HCC) - Has had multiple cardioversions and ablations in the past.   - On Eliquis for anticoagulation. Regular heart rate noted on examination. -Continue Eliquis as prescribed.   I spent a total of 25 minutes during this encounter with the patient including review of chart and various tests results, discussions about plan of care and coordination of care plan.  I reviewed lab results and outside records for this visit and discussed relevant results with the patient. Diagnosis, plan of care and treatment options were also discussed in detail with the patient. Opportunity provided to ask questions and answers provided to his apparent satisfaction. Provided instructions to call our clinic with any problems, questions or concerns prior to return visit. I recommended to continue follow-up with PCP and sub-specialists. He verbalized understanding and agreed with the plan. No barriers to learning was detected.  [name], MD  05/03/2023 2:11 PM  [facility] - A DEPT OF [facility] [address] [place] [address] Dept: [phone] Dept Fax: [phone]   CHIEF COMPLAINT/ REASON FOR VISIT:  Follow up for hereditary hemochromatosis, heterozygous for C282Y and H63D mutations.  INTERVAL HISTORY:  Discussed the use of AI scribe software for clinical note  transcription with the patient, who gave verbal consent to proceed.   Patient  returns for follow up. He reports feeling fine overall. He has been managing his conditions without any significant issues. He also mentions a history of atrial fibrillation and an aneurysm, but these conditions do not appear to be currently active. The patient's bowel movements have been irregular, which he manages with Metamucil. He reports no other significant symptoms.  SUMMARY OF HEMATOLOGIC HISTORY:  83 y.o. gentleman with a past medical history of paroxysmal atrial fibrillation, ascending aortic aneurysm, hypertension, dyslipidemia, CAD, unspecified type of hemochromatosis, cerebral aneurysm status post coil embolization in [facility] in May 2006, was referred to our service in October 2024 for evaluation of hemochromatosis.     Patient was apparently diagnosed with hemochromatosis in the 67s when he was living in [place]. The patient was initially managed with weekly phlebotomies, which gradually decreased in frequency.  Last phlebotomy was few years ago.  The patient also has a history of heart issues, including atrial fibrillation, for which he has had ablations and cardioversions. The patient reports that his heart rate has been regular recently.  He was recently seen by his cardiologist Dr. Bjorn Pippin who referred him to [place] for his history of hemochromatosis.     The patient was diagnosed with celiac disease in 2023 and has been managing it with dietary modifications. The patient reports some issues with bowel movements, particularly when consuming a lot of greens and salads. The patient also reports some leg swelling, which he attributes to an increase in his amlodipine dosage.   On his initial consultation with [place] on 02/01/2023, hemochromatosis testing revealed heterozygosity for C282Y and H63D mutations.  No evidence of S65C mutation.  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  Ferritin goal will be adjusted depending on rest of the phenotype/liver  numbers.  Goal is to maintain ferritin at least below 200 with therapeutic phlebotomies as needed.   On 02/01/2023, LFTs were normal except for slightly elevated ALT of 48.  Iron saturation was 24%, ferritin was 108.   Recent cardiac MRI on 01/19/2023 showed no evidence of iron overload in the heart  I have reviewed the past medical history, past surgical history, social history and family history with the patient and they are unchanged from previous note.  ALLERGIES: He is allergic to codeine and sulfa antibiotics.  MEDICATIONS:  Current Outpatient Medications  Medication Sig Dispense Refill   allopurinol (ZYLOPRIM) 300 MG tablet Take 300 mg by mouth daily.       amLODipine (NORVASC) 10 MG tablet Take 1 tablet (10 mg total) by mouth daily. 90 tablet 3   Ascorbic Acid (VITAMIN C PO) Take 1 tablet by mouth daily.     atorvastatin (LIPITOR) 10 MG tablet TAKE 1 TABLET BY MOUTH EVERY DAY 90 tablet 0   ELIQUIS 5 MG TABS tablet TAKE 1 TABLET TWICE A DAY 180 tablet 1   metoprolol succinate (TOPROL-XL) 50 MG 24 hr tablet TAKE 1 TABLET BY MOUTH EVERY DAY WITH OR IMMEDIATELY FOLLOWING A MEAL 90 tablet 3   multivitamin-lutein (OCUVITE-LUTEIN) CAPS Take 1 capsule by mouth daily.       Omega-3 Fatty Acids (FISH OIL PO) Take 1 Capful by mouth daily.     valsartan (DIOVAN) 320 MG tablet Take 320 mg by mouth daily.     VITAMIN D, ERGOCALCIFEROL, PO Take 1 Capful by mouth daily.     acetaminophen (TYLENOL) 500 MG tablet Take 1,000 mg by  mouth every 6 (six) hours as needed. pain  (Patient not taking: Reported on 02/01/2023)     chlorpheniramine (CHLOR-TRIMETON) 4 MG tablet Take 4 mg by mouth 2 (two) times daily as needed. allergies  (Patient not taking: Reported on 05/03/2023)     clobetasol cream (TEMOVATE) 0.05 % Apply topically daily. (Patient not taking: Reported on 05/03/2023)     ketoconazole (NIZORAL) 2 % cream Apply topically daily. (Patient not taking: Reported on 05/03/2023)     triamcinolone cream  (KENALOG) 0.1 % as needed. (Patient not taking: Reported on 05/03/2023)     No current facility-administered medications for this visit.     REVIEW OF SYSTEMS:    ROS  All other pertinent systems were reviewed with the patient and are negative.  PHYSICAL EXAMINATION:  ECOG PERFORMANCE STATUS: 1 - Symptomatic but completely ambulatory   Onc Performance Status - 05/03/23 1122       ECOG Perf Status   ECOG Perf Status Restricted in physically strenuous activity but ambulatory and able to carry out work of a light or sedentary nature, e.g., light house work, office work      KPS SCALE   KPS % SCORE Able to carry on normal activity, minor s/s of disease             Vitals:   05/03/23 1115  BP: (!) 141/82  Pulse: 63  Resp: 16  Temp: 97.9 F (36.6 C)  SpO2: 97%   Filed Weights   05/03/23 1115  Weight: 198 lb 11.2 oz (90.1 kg)    Physical Exam Constitutional:      General: He is not in acute distress.    Appearance: Normal appearance.  HENT:     Head: Normocephalic and atraumatic.  Eyes:     General: No scleral icterus.    Conjunctiva/sclera: Conjunctivae normal.  Cardiovascular:     Rate and Rhythm: Normal rate and regular rhythm.     Heart sounds: Normal heart sounds.  Pulmonary:     Effort: Pulmonary effort is normal.     Breath sounds: Normal breath sounds.  Abdominal:     General: There is no distension.  Musculoskeletal:     Right lower leg: No edema.     Left lower leg: No edema.  Neurological:     General: No focal deficit present.     Mental Status: He is alert and oriented to person, place, and time.  Psychiatric:        Mood and Affect: Mood normal.        Behavior: Behavior normal.        Thought Content: Thought content normal.     LABORATORY DATA:   I have reviewed the data as listed.  Results for orders placed or performed in visit on 05/03/23  Iron and TIBC  Result Value Ref Range   Iron 134 45 - 182 ug/dL   TIBC 161 096 - 045  ug/dL   Saturation Ratios 40 (H) 17.9 - 39.5 %   UIBC 203 ug/dL  Results for orders placed or performed in visit on 05/03/23  Ferritin  Result Value Ref Range   Ferritin 102 24 - 336 ng/mL  CBC with Differential/Platelet  Result Value Ref Range   WBC 7.2 4.0 - 10.5 K/uL   RBC 4.97 4.22 - 5.81 MIL/uL   Hemoglobin 16.5 13.0 - 17.0 g/dL   HCT 40.9 81.1 - 91.4 %   MCV 97.2 80.0 - 100.0 fL   MCH 33.2 26.0 -  34.0 pg   MCHC 34.2 30.0 - 36.0 g/dL   RDW 09.8 11.9 - 14.7 %   Platelets 165 150 - 400 K/uL   nRBC 0.0 0.0 - 0.2 %   Neutrophils Relative % 70 %   Neutro Abs 5.0 1.7 - 7.7 K/uL   Lymphocytes Relative 16 %   Lymphs Abs 1.2 0.7 - 4.0 K/uL   Monocytes Relative 10 %   Monocytes Absolute 0.8 0.1 - 1.0 K/uL   Eosinophils Relative 4 %   Eosinophils Absolute 0.3 0.0 - 0.5 K/uL   Basophils Relative 0 %   Basophils Absolute 0.0 0.0 - 0.1 K/uL   Immature Granulocytes 0 %   Abs Immature Granulocytes 0.02 0.00 - 0.07 K/uL  Comprehensive metabolic panel  Result Value Ref Range   Sodium 139 135 - 145 mmol/L   Potassium 4.3 3.5 - 5.1 mmol/L   Chloride 101 98 - 111 mmol/L   CO2 30 22 - 32 mmol/L   Glucose, Bld 164 (H) 70 - 99 mg/dL   BUN 17 8 - 23 mg/dL   Creatinine, Ser 8.29 0.61 - 1.24 mg/dL   Calcium 9.5 8.9 - 56.2 mg/dL   Total Protein 7.9 6.5 - 8.1 g/dL   Albumin 4.6 3.5 - 5.0 g/dL   AST 33 15 - 41 U/L   ALT 41 0 - 44 U/L   Alkaline Phosphatase 85 38 - 126 U/L   Total Bilirubin 1.3 (H) 0.0 - 1.2 mg/dL   GFR, Estimated >13 >08 mL/min   Anion gap 8 5 - 15    RADIOGRAPHIC STUDIES:  No recent pertinent imaging studies available to review.  Orders Placed This Encounter  Procedures   Iron and TIBC   CBC with Differential/Platelet    Standing Status:   Future    Expected Date:   08/31/2023    Expiration Date:   05/02/2024   Comprehensive metabolic panel    Standing Status:   Future    Expected Date:   08/31/2023    Expiration Date:   05/02/2024   Iron and TIBC    Standing  Status:   Future    Expected Date:   08/31/2023    Expiration Date:   05/02/2024   Ferritin    Standing Status:   Future    Expected Date:   08/31/2023    Expiration Date:   05/02/2024     Future Appointments  Date Time Provider Department Center  06/22/2023  1:20 PM Little Ishikawa, MD [facility] None  08/30/2023 11:00 AM [facility] PHLEBOTOMIST [facility] None  08/30/2023 11:20 AM [place] Deleo, Archie Patten, MD [facility] None  01/04/2024  8:30 AM [facility] 1 [id number]     This document was completed utilizing speech recognition software. Grammatical errors, random word insertions, pronoun errors, and incomplete sentences are an occasional consequence of this system due to software limitations, ambient noise, and hardware issues. Any formal questions or concerns about the content, text or information contained within the body of this dictation should be directly addressed to the provider for clarification.

## 2023-05-03 NOTE — Assessment & Plan Note (Signed)
-  Has had multiple cardioversions and ablations in the past.   - On Eliquis for anticoagulation. Regular heart rate noted on examination. -Continue Eliquis as prescribed.

## 2023-05-04 DIAGNOSIS — I671 Cerebral aneurysm, nonruptured: Secondary | ICD-10-CM | POA: Diagnosis not present

## 2023-05-05 ENCOUNTER — Other Ambulatory Visit: Payer: Self-pay | Admitting: Nurse Practitioner

## 2023-06-21 NOTE — Progress Notes (Unsigned)
 Cardiology Office Note:    Date:  06/22/2023   ID:  Randy Evans, DOB Oct 09, 1940, MRN [id number]  PCP:  Darrow Bussing, MD  Cardiologist:  Little Ishikawa, MD  Electrophysiologist:  Lewayne Bunting, MD   Referring MD: Darrow Bussing, MD   Chief Complaint  Patient presents with   Atrial Fibrillation    History of Present Illness:    Randy Evans is a 83 y.o. male with a hx of atrial fibrillation/flutter, ascending aortic aneurysm, cerebral aneurysm status post coil embolization, hemochromatosis, gout who presents for follow-up.  Previously followed with Dr. Katrinka Blazing.  Cardiac MRI 01/2023 showed LVEF 56%, no evidence of myocardial iron overload, LGE consistent with prior infarct in basal anterolateral and basal to mid inferolateral wall.  Since last clinic visit, he reports he is doing well.  Denies any chest pain, dyspnea, lightheadedness, syncope, or palpitations.  Reports some lower extremity edema, particularly at the end of day.  Has not felt like he has had more atrial fibrillation   Wt Readings from Last 3 Encounters:  06/22/23 199 lb 3.2 oz (90.4 kg)  05/03/23 198 lb 11.2 oz (90.1 kg)  02/01/23 207 lb (93.9 kg)     Past Medical History:  Diagnosis Date   Atrial fibrillation (HCC)    Onset 1991 in [place]   Cerebral aneurysm without rupture    treated with coil embolization 2006   Gout    Hemochromatosis    Has seen Dorena Cookey   Hypertension     Past Surgical History:  Procedure Laterality Date   ANEURYSM COILING  2006   bone spur removal  1951   below left knee   BRAIN SURGERY     CARDIAC ELECTROPHYSIOLOGY MAPPING AND ABLATION  01/2008; 07/2008   CARDIOVERSION  2000; 2008 03/2008; ;02/15/2011   2008; 2009 2012:    Surgeon: Darden Palmer., MD;  Location: [facility] OR;  Service: Cardiovascular;  Laterality: N/A;   CARDIOVERSION  08/05/2011   Procedure: CARDIOVERSION;  Surgeon: Othella Boyer, MD;  Location: [facility] OR;  Service: Cardiovascular;  Laterality:  N/A;   CATARACT EXTRACTION W/ INTRAOCULAR LENS  IMPLANT, BILATERAL  2009-2010   IR ANGIO INTRA EXTRACRAN SEL COM CAROTID INNOMINATE UNI L MOD SED  11/09/2016   IR ANGIO INTRA EXTRACRAN SEL COM CAROTID INNOMINATE UNI R MOD SED  07/17/2019   IR ANGIO INTRA EXTRACRAN SEL INTERNAL CAROTID UNI R MOD SED  11/09/2016   IR US GUIDE VASC ACCESS RIGHT  07/17/2019   LIVER BIOPSY  late 1990's   TOE SURGERY      Current Medications: Current Meds  Medication Sig   acetaminophen (TYLENOL) 500 MG tablet Take 1,000 mg by mouth every 6 (six) hours as needed. pain   allopurinol (ZYLOPRIM) 300 MG tablet Take 300 mg by mouth daily.     amLODipine (NORVASC) 10 MG tablet Take 1 tablet (10 mg total) by mouth daily.   Ascorbic Acid (VITAMIN C PO) Take 1 tablet by mouth daily.   atorvastatin (LIPITOR) 10 MG tablet TAKE 1 TABLET BY MOUTH EVERY DAY   chlorpheniramine (CHLOR-TRIMETON) 4 MG tablet Take 4 mg by mouth 2 (two) times daily as needed. allergies   clobetasol cream (TEMOVATE) 0.05 % Apply topically daily.   ELIQUIS 5 MG TABS tablet TAKE 1 TABLET TWICE A DAY   ketoconazole (NIZORAL) 2 % cream Apply topically daily.   metoprolol succinate (TOPROL-XL) 50 MG 24 hr tablet TAKE 1 TABLET BY MOUTH EVERY DAY WITH OR  IMMEDIATELY FOLLOWING A MEAL   multivitamin-lutein (OCUVITE-LUTEIN) CAPS Take 1 capsule by mouth daily.     Omega-3 Fatty Acids (FISH OIL PO) Take 1 Capful by mouth daily.   valsartan (DIOVAN) 320 MG tablet Take 320 mg by mouth daily.   VITAMIN D, ERGOCALCIFEROL, PO Take 1 Capful by mouth daily.     Allergies:   Codeine and Sulfa antibiotics   Social History   Socioeconomic History   Marital status: Married    Spouse name: Not on file   Number of children: Not on file   Years of education: Not on file   Highest education level: Not on file  Occupational History   Not on file  Tobacco Use   Smoking status: Former    Types: Cigarettes, Pipe, Cigars   Smokeless tobacco: Never   Tobacco comments:     quit smoking 1966  Substance and Sexual Activity   Alcohol use: Yes    Alcohol/week: 2.0 standard drinks of alcohol    Types: 2 Glasses of wine per week    Comment: SOCIAL   Drug use: No   Sexual activity: Yes    Partners: Female    Comment: MARRIED  Other Topics Concern   Not on file  Social History Narrative   Retired from [employer].  Moved from [place]. Graduate of [employer].  Married.   Social Drivers of Corporate investment banker Strain: Not on file  Food Insecurity: No Food Insecurity (02/01/2023)   Hunger Vital Sign    Worried About Running Out of Food in the Last Year: Never true    Ran Out of Food in the Last Year: Never true  Transportation Needs: No Transportation Needs (02/01/2023)   PRAPARE - Administrator, Civil Service (Medical): No    Lack of Transportation (Non-Medical): No  Physical Activity: Not on file  Stress: Not on file  Social Connections: Not on file     Family History: The patient's family history is not on file.  ROS:   Please see the history of present illness.     All other systems reviewed and are negative.  EKGs/Labs/Other Studies Reviewed:    The following studies were reviewed today:   EKG:   01/05/2023: Sinus rhythm with first-degree AV block, rate 60, right bundle branch block, left anterior fascicular block  Recent Labs: 01/05/2023: BNP 64.4 05/03/2023: ALT 41; BUN 17; Creatinine, Ser 0.82; Hemoglobin 16.5; Platelets 165; Potassium 4.3; Sodium 139  Recent Lipid Panel No results found for: "CHOL", "TRIG", "HDL", "CHOLHDL", "VLDL", "LDLCALC", "LDLDIRECT"  Physical Exam:    VS:  BP 130/82   Pulse 75   Ht 5\' 8"  (1.727 m)   Wt 199 lb 3.2 oz (90.4 kg)   SpO2 97%   BMI 30.29 kg/m     Wt Readings from Last 3 Encounters:  06/22/23 199 lb 3.2 oz (90.4 kg)  05/03/23 198 lb 11.2 oz (90.1 kg)  02/01/23 207 lb (93.9 kg)     GEN:  Well nourished, well developed in no acute distress HEENT: Normal NECK: No JVD; No  carotid bruits LYMPHATICS: No lymphadenopathy CARDIAC: RRR, no murmurs, rubs, gallops RESPIRATORY:  Clear to auscultation without rales, wheezing or rhonchi  ABDOMEN: Soft, non-tender, non-distended MUSCULOSKELETAL: Trace edema SKIN: Warm and dry NEUROLOGIC:  Alert and oriented x 3 PSYCHIATRIC:  Normal affect   ASSESSMENT:    1. PAF (paroxysmal atrial fibrillation) (HCC)   2. Hemochromatosis, unspecified hemochromatosis type   3. Aortic dilatation (  HCC)   4. CAD in native artery   5. Essential hypertension   6. Hyperlipidemia, unspecified hyperlipidemia type     PLAN:    Paroxysmal atrial fibrillation: Follows with EP.  Status post multiple ablations.  Previously has been on Tikosyn but off antiarrhythmic therapy for over a year.  Echocardiogram 04/2018 showed EF 50 to 55%, grade 2 diastolic dysfunction, ascending aortic dilatation measuring 43 mm -Continue Eliquis 5 mg twice daily -Continue Toprol-XL 50 mg daily  Aortic aneurysm: Measured 43 mm and ascending aorta on echo 04/2019.  CTA chest 10/2021 showed ascending aortic aneurysm measuring 46 mm -MRI chest 01/2023 showed ascending aorta measured 42 mm, sinus of Valsalva measured 43 mm.  Will continue to monitor  CAD: Cardiac MRI 08/2019 showed subendocardial LGE consistent with prior infarct in basal to mid inferolateral wall and basal anterolateral wall.  Overall EF 66%.  Denies anginal symptoms -Continue Eliquis 5 mg twice daily -Continue atorvastatin 10 mg daily  Hypertension: On amlodipine 10 mg daily and Toprol-XL 50 mg daily  Hyperlipidemia: On atorvastatin 10 mg daily.  LDL 31 on 05/25/2022  Hemochromatosis: previously required weekly phlebotomy now only receiving rarely.  Reports diagnosis was confirmed with genetic testing in [place].   -Referred to hematology -Cardiac MRI 01/2023 showed no evidence of myocardial iron overload  Lower extremity edema: Suspect due to amlodipine use.  Normal BNP, albumin 01/2023  RTC in  6 months   Medication Adjustments/Labs and Tests Ordered: Current medicines are reviewed at length with the patient today.  Concerns regarding medicines are outlined above.  No orders of the defined types were placed in this encounter.  No orders of the defined types were placed in this encounter.   Patient Instructions   Medication Instructions:   No changes  *If you need a refill on your cardiac medications before your next appointment, please call your pharmacy*   Lab Work:  Not  needed   Testing/Procedures:  Not needed  Follow-Up: At [facility], you and your health needs are our priority.  As part of our continuing mission to provide you with exceptional heart care, we have created designated Provider Care Teams.  These Care Teams include your primary Cardiologist (physician) and Advanced Practice Providers (APPs -  Physician Assistants and Nurse Practitioners) who all work together to provide you with the care you need, when you need it.     Your next appointment:   6 month(s)  The format for your next appointment:   In Person  Provider:   Little Ishikawa, MD     s    Signed, Little Ishikawa, MD  06/22/2023 1:43 PM    [facility]

## 2023-06-22 ENCOUNTER — Ambulatory Visit: Payer: Medicare Other | Attending: Cardiology | Admitting: Cardiology

## 2023-06-22 ENCOUNTER — Encounter: Payer: Self-pay | Admitting: Cardiology

## 2023-06-22 VITALS — BP 130/82 | HR 75 | Ht 68.0 in | Wt 199.2 lb

## 2023-06-22 DIAGNOSIS — I480 Paroxysmal atrial fibrillation: Secondary | ICD-10-CM | POA: Diagnosis not present

## 2023-06-22 DIAGNOSIS — I4892 Unspecified atrial flutter: Secondary | ICD-10-CM | POA: Diagnosis not present

## 2023-06-22 DIAGNOSIS — Z1331 Encounter for screening for depression: Secondary | ICD-10-CM | POA: Diagnosis not present

## 2023-06-22 DIAGNOSIS — E7800 Pure hypercholesterolemia, unspecified: Secondary | ICD-10-CM | POA: Diagnosis not present

## 2023-06-22 DIAGNOSIS — E785 Hyperlipidemia, unspecified: Secondary | ICD-10-CM | POA: Diagnosis not present

## 2023-06-22 DIAGNOSIS — M1812 Unilateral primary osteoarthritis of first carpometacarpal joint, left hand: Secondary | ICD-10-CM | POA: Diagnosis not present

## 2023-06-22 DIAGNOSIS — I2510 Atherosclerotic heart disease of native coronary artery without angina pectoris: Secondary | ICD-10-CM | POA: Insufficient documentation

## 2023-06-22 DIAGNOSIS — Z79899 Other long term (current) drug therapy: Secondary | ICD-10-CM | POA: Diagnosis not present

## 2023-06-22 DIAGNOSIS — I671 Cerebral aneurysm, nonruptured: Secondary | ICD-10-CM | POA: Diagnosis not present

## 2023-06-22 DIAGNOSIS — I10 Essential (primary) hypertension: Secondary | ICD-10-CM | POA: Insufficient documentation

## 2023-06-22 DIAGNOSIS — I77819 Aortic ectasia, unspecified site: Secondary | ICD-10-CM | POA: Insufficient documentation

## 2023-06-22 DIAGNOSIS — N5201 Erectile dysfunction due to arterial insufficiency: Secondary | ICD-10-CM | POA: Diagnosis not present

## 2023-06-22 DIAGNOSIS — M109 Gout, unspecified: Secondary | ICD-10-CM | POA: Diagnosis not present

## 2023-06-22 DIAGNOSIS — Z0001 Encounter for general adult medical examination with abnormal findings: Secondary | ICD-10-CM | POA: Diagnosis not present

## 2023-06-22 DIAGNOSIS — E119 Type 2 diabetes mellitus without complications: Secondary | ICD-10-CM | POA: Diagnosis not present

## 2023-06-22 NOTE — Patient Instructions (Signed)
 Medication Instructions:   No changes  *If you need a refill on your cardiac medications before your next appointment, please call your pharmacy*   Lab Work:  Not  needed   Testing/Procedures:  Not needed  Follow-Up: At [facility], you and your health needs are our priority.  As part of our continuing mission to provide you with exceptional heart care, we have created designated Provider Care Teams.  These Care Teams include your primary Cardiologist (physician) and Advanced Practice Providers (APPs -  Physician Assistants and Nurse Practitioners) who all work together to provide you with the care you need, when you need it.     Your next appointment:   6 month(s)  The format for your next appointment:   In Person  Provider:   Little Ishikawa, MD     s

## 2023-06-29 ENCOUNTER — Other Ambulatory Visit: Payer: Self-pay | Admitting: Internal Medicine

## 2023-06-29 DIAGNOSIS — I480 Paroxysmal atrial fibrillation: Secondary | ICD-10-CM

## 2023-06-29 NOTE — Telephone Encounter (Signed)
 Pt last saw Dr Bjorn Pippin 06/22/23, last labs 05/03/23 Creat 0.82, age 83, weight 90.4kg, based on specified criteria pt is on appropriate dosage of Eliquis 5mg  BID for afib.  Will refill rx.

## 2023-07-12 DIAGNOSIS — E119 Type 2 diabetes mellitus without complications: Secondary | ICD-10-CM | POA: Diagnosis not present

## 2023-07-26 DIAGNOSIS — E119 Type 2 diabetes mellitus without complications: Secondary | ICD-10-CM | POA: Diagnosis not present

## 2023-07-26 DIAGNOSIS — I10 Essential (primary) hypertension: Secondary | ICD-10-CM | POA: Diagnosis not present

## 2023-07-27 DIAGNOSIS — R152 Fecal urgency: Secondary | ICD-10-CM | POA: Diagnosis not present

## 2023-07-27 DIAGNOSIS — Z860101 Personal history of adenomatous and serrated colon polyps: Secondary | ICD-10-CM | POA: Diagnosis not present

## 2023-07-27 DIAGNOSIS — K900 Celiac disease: Secondary | ICD-10-CM | POA: Diagnosis not present

## 2023-08-30 ENCOUNTER — Inpatient Hospital Stay: Attending: Oncology

## 2023-08-30 ENCOUNTER — Ambulatory Visit: Payer: Medicare Other | Admitting: Oncology

## 2023-08-30 ENCOUNTER — Encounter: Payer: Self-pay | Admitting: Oncology

## 2023-08-30 ENCOUNTER — Other Ambulatory Visit: Payer: Medicare Other

## 2023-08-30 ENCOUNTER — Inpatient Hospital Stay (HOSPITAL_BASED_OUTPATIENT_CLINIC_OR_DEPARTMENT_OTHER): Admitting: Oncology

## 2023-08-30 DIAGNOSIS — Z79899 Other long term (current) drug therapy: Secondary | ICD-10-CM | POA: Diagnosis not present

## 2023-08-30 DIAGNOSIS — Z7901 Long term (current) use of anticoagulants: Secondary | ICD-10-CM | POA: Diagnosis not present

## 2023-08-30 DIAGNOSIS — I480 Paroxysmal atrial fibrillation: Secondary | ICD-10-CM

## 2023-08-30 DIAGNOSIS — K900 Celiac disease: Secondary | ICD-10-CM | POA: Diagnosis not present

## 2023-08-30 LAB — FERRITIN: Ferritin: 138 ng/mL (ref 24–336)

## 2023-08-30 LAB — CBC WITH DIFFERENTIAL/PLATELET
Abs Immature Granulocytes: 0.01 10*3/uL (ref 0.00–0.07)
Basophils Absolute: 0 10*3/uL (ref 0.0–0.1)
Basophils Relative: 1 %
Eosinophils Absolute: 0.3 10*3/uL (ref 0.0–0.5)
Eosinophils Relative: 4 %
HCT: 47.5 % (ref 39.0–52.0)
Hemoglobin: 16.3 g/dL (ref 13.0–17.0)
Immature Granulocytes: 0 %
Lymphocytes Relative: 16 %
Lymphs Abs: 1 10*3/uL (ref 0.7–4.0)
MCH: 33.7 pg (ref 26.0–34.0)
MCHC: 34.3 g/dL (ref 30.0–36.0)
MCV: 98.1 fL (ref 80.0–100.0)
Monocytes Absolute: 0.8 10*3/uL (ref 0.1–1.0)
Monocytes Relative: 14 %
Neutro Abs: 4 10*3/uL (ref 1.7–7.7)
Neutrophils Relative %: 65 %
Platelets: 146 10*3/uL — ABNORMAL LOW (ref 150–400)
RBC: 4.84 MIL/uL (ref 4.22–5.81)
RDW: 13.3 % (ref 11.5–15.5)
WBC: 6.1 10*3/uL (ref 4.0–10.5)
nRBC: 0 % (ref 0.0–0.2)

## 2023-08-30 LAB — COMPREHENSIVE METABOLIC PANEL WITH GFR
ALT: 50 U/L — ABNORMAL HIGH (ref 0–44)
AST: 48 U/L — ABNORMAL HIGH (ref 15–41)
Albumin: 4.4 g/dL (ref 3.5–5.0)
Alkaline Phosphatase: 111 U/L (ref 38–126)
Anion gap: 13 (ref 5–15)
BUN: 15 mg/dL (ref 8–23)
CO2: 25 mmol/L (ref 22–32)
Calcium: 9.7 mg/dL (ref 8.9–10.3)
Chloride: 102 mmol/L (ref 98–111)
Creatinine, Ser: 0.82 mg/dL (ref 0.61–1.24)
GFR, Estimated: >60 mL/min (ref >60)
Glucose, Bld: 170 mg/dL — ABNORMAL HIGH (ref 70–99)
Potassium: 4 mmol/L (ref 3.5–5.1)
Sodium: 140 mmol/L (ref 135–145)
Total Bilirubin: 1.1 mg/dL (ref 0.0–1.2)
Total Protein: 7.6 g/dL (ref 6.5–8.1)

## 2023-08-30 LAB — IRON AND TIBC
Iron: 116 ug/dL (ref 45–182)
Saturation Ratios: 36 % (ref 17.9–39.5)
TIBC: 326 ug/dL (ref 250–450)
UIBC: 210 ug/dL

## 2023-08-30 NOTE — Assessment & Plan Note (Signed)
 Diagnosed in 2023 by Levora Angel, MD. Currently on a gluten-free diet. Reports occasional loose stools and leakage with increased intake of greens and salads. -Continue gluten-free diet and other dietary modifications to manage bowel symptoms.

## 2023-08-30 NOTE — Progress Notes (Signed)
 [facility]  HEMATOLOGY CLINIC PROGRESS NOTE  PATIENT NAME: Randy Evans   MR#: [id number] DOB: [birth year]  Patient Care Team: Lanae Pinal, MD as PCP - General (Family Medicine) Tammie Fall, MD as PCP - Electrophysiology (Cardiology) Wendie Hamburg, MD as PCP - Cardiology (Cardiology) Dorsey Gault, MD as Consulting Physician (Cardiology) Wendie Hamburg, MD as Consulting Physician (Cardiology)  Date of visit: 08/30/2023   ASSESSMENT & PLAN:   Randy Evans is a 83 y.o. gentleman with a past medical history of paroxysmal atrial fibrillation, ascending aortic aneurysm, hypertension, dyslipidemia, CAD, unspecified type of hemochromatosis, cerebral aneurysm status post coil embolization in [facility] in May 2006, was referred to our service in October 2024 for evaluation of hemochromatosis.  Found to have heterozygosity for C282Y and H63D mutations.  Hereditary hemochromatosis (HCC) -History of diagnosis in the 1990s with subsequent phlebotomies. No recent phlebotomies.  He apparently even had liver biopsy.    -On his initial consultation with us  on 02/01/2023, hemochromatosis testing revealed heterozygosity for C282Y and H63D mutations.  No evidence of S65C mutation.  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  Ferritin goal will be adjusted depending on rest of the phenotype/liver numbers.  Goal is to maintain ferritin at least below 200 with therapeutic phlebotomies as needed.  -On 02/01/2023, LFTs were normal except for slightly elevated ALT of 48.  Iron saturation was 24%, ferritin was 108.  -Cardiac MRI on 01/19/2023 showed no evidence of iron overload in the heart  -Labs today revealed grossly unremarkable CBCD and CMP.  Ferritin 138.  Iron studies pending.  No indication for therapeutic phlebotomy currently.  He has not needed therapeutic phlebotomy since we started seeing him in our clinic  in October 2024.  -I will see him in 6 months with repeat labs including iron studies, LFTs.  If ferritin is approaching 200 or if iron saturation is approaching 50%, then we will plan for therapeutic phlebotomies.   ACD (adult celiac disease) Diagnosed in 2023 by Brahmbhatt, MD. Currently on a gluten-free diet. Reports occasional loose stools and leakage with increased intake of greens and salads. -Continue gluten-free diet and other dietary modifications to manage bowel symptoms.  Atrial fibrillation (HCC) - Has had multiple cardioversions and ablations in the past.   - On Eliquis  for anticoagulation. Regular heart rate noted on examination. -Continue Eliquis  as prescribed.   I spent a total of 20 minutes during this encounter with the patient including review of chart and various tests results, discussions about plan of care and coordination of care plan.  I reviewed lab results and outside records for this visit and discussed relevant results with the patient. Diagnosis, plan of care and treatment options were also discussed in detail with the patient. Opportunity provided to ask questions and answers provided to his apparent satisfaction. Provided instructions to call our clinic with any problems, questions or concerns prior to return visit. I recommended to continue follow-up with PCP and sub-specialists. He verbalized understanding and agreed with the plan. No barriers to learning was detected.  Arlo Berber, MD  08/30/2023 11:18 AM  [facility] - A DEPT OF [facility] [id number] [place] [address] Dept: [phone] Dept Fax: [phone]   CHIEF COMPLAINT/ REASON FOR VISIT:  Follow up for hereditary hemochromatosis, heterozygous for C282Y and H63D mutations.  INTERVAL HISTORY:  Discussed the use of AI scribe software for clinical  note transcription with the patient, who gave verbal consent to  proceed.  History of Present Illness Randy Evans "Chalice Colt" is an 83 year old male with hereditary hemochromatosis who presents for follow-up of ferritin levels. He was referred by Dr. Alda Amas for ongoing management of his condition.  He has hereditary hemochromatosis with heterozygous mutations for C282Y and H63D2. His last recorded ferritin level in January was 102. He has not required phlebotomy since we started seeing him in October 2024, indicating stable iron levels.  No symptoms such as chest pain, abdominal pain, loss of appetite, or weight loss.   He also mentions a history of atrial fibrillation and an aneurysm, but these conditions do not appear to be currently active. The patient's bowel movements have been irregular, which he manages with Metamucil. He reports no other significant symptoms.  SUMMARY OF HEMATOLOGIC HISTORY:  83 y.o. gentleman with a past medical history of paroxysmal atrial fibrillation, ascending aortic aneurysm, hypertension, dyslipidemia, CAD, unspecified type of hemochromatosis, cerebral aneurysm status post coil embolization in [facility] in May 2006, was referred to our service in October 2024 for evaluation of hemochromatosis.     Patient was apparently diagnosed with hemochromatosis in the 3s when he was living in [place]. The patient was initially managed with weekly phlebotomies, which gradually decreased in frequency.  Last phlebotomy was few years ago.  The patient also has a history of heart issues, including atrial fibrillation, for which he has had ablations and cardioversions. The patient reports that his heart rate has been regular recently.  He was recently seen by his cardiologist Dr. Alda Amas who referred him to us  for his history of hemochromatosis.     The patient was diagnosed with celiac disease in 2023 and has been managing it with dietary modifications. The patient reports some issues with bowel movements, particularly when consuming a lot of  greens and salads. The patient also reports some leg swelling, which he attributes to an increase in his amlodipine  dosage.   On his initial consultation with us  on 02/01/2023, hemochromatosis testing revealed heterozygosity for C282Y and H63D mutations.  No evidence of S65C mutation.  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  Ferritin goal will be adjusted depending on rest of the phenotype/liver numbers.  Goal is to maintain ferritin at least below 200 with therapeutic phlebotomies as needed.   On 02/01/2023, LFTs were normal except for slightly elevated ALT of 48.  Iron saturation was 24%, ferritin was 108.   Cardiac MRI on 01/19/2023 showed no evidence of iron overload in the heart.  I have reviewed the past medical history, past surgical history, social history and family history with the patient and they are unchanged from previous note.  ALLERGIES: He is allergic to codeine and sulfa antibiotics.  MEDICATIONS:  Current Outpatient Medications  Medication Sig Dispense Refill   acetaminophen  (TYLENOL ) 500 MG tablet Take 1,000 mg by mouth every 6 (six) hours as needed. pain     allopurinol  (ZYLOPRIM ) 300 MG tablet Take 300 mg by mouth daily.       amLODipine  (NORVASC ) 10 MG tablet TAKE 1 TABLET DAILY 90 tablet 3   apixaban  (ELIQUIS ) 5 MG TABS tablet TAKE 1 TABLET TWICE A DAY 180 tablet 2   Ascorbic Acid (VITAMIN C PO) Take 1 tablet by mouth daily.     atorvastatin  (LIPITOR) 10 MG tablet TAKE 1 TABLET BY MOUTH EVERY DAY 90 tablet 2   chlorpheniramine (CHLOR-TRIMETON) 4 MG tablet Take 4 mg  by mouth 2 (two) times daily as needed. allergies     clobetasol cream (TEMOVATE) 0.05 % Apply topically daily.     FARXIGA 5 MG TABS tablet Take 5 mg by mouth daily.     ketoconazole  (NIZORAL ) 2 % cream Apply topically daily.     metoprolol  succinate (TOPROL -XL) 50 MG 24 hr tablet TAKE 1 TABLET BY MOUTH EVERY DAY WITH OR IMMEDIATELY FOLLOWING A MEAL 90  tablet 3   multivitamin-lutein  (OCUVITE-LUTEIN ) CAPS Take 1 capsule by mouth daily.       Omega-3 Fatty Acids (FISH OIL PO) Take 1 Capful by mouth daily.     valsartan (DIOVAN) 320 MG tablet Take 320 mg by mouth daily.     VITAMIN D , ERGOCALCIFEROL , PO Take 1 Capful by mouth daily.     No current facility-administered medications for this visit.     REVIEW OF SYSTEMS:    ROS  All other pertinent systems were reviewed with the patient and are negative.  PHYSICAL EXAMINATION:   Onc Performance Status - 08/30/23 0841       ECOG Perf Status   ECOG Perf Status Fully active, able to carry on all pre-disease performance without restriction      KPS SCALE   KPS % SCORE Normal, no compliants, no evidence of disease             Vitals:   08/30/23 0834  BP: 138/81  Pulse: 63  Resp: 17  Temp: 97.7 F (36.5 C)  SpO2: 97%   Filed Weights   08/30/23 0834  Weight: 198 lb 11.2 oz (90.1 kg)    Physical Exam Constitutional:      General: He is not in acute distress.    Appearance: Normal appearance.  HENT:     Head: Normocephalic and atraumatic.  Cardiovascular:     Rate and Rhythm: Normal rate and regular rhythm.     Heart sounds: Normal heart sounds.  Pulmonary:     Effort: Pulmonary effort is normal. No respiratory distress.  Abdominal:     General: There is no distension.  Neurological:     General: No focal deficit present.     Mental Status: He is alert and oriented to person, place, and time.  Psychiatric:        Mood and Affect: Mood normal.        Behavior: Behavior normal.     LABORATORY DATA:   I have reviewed the data as listed.  Results for orders placed or performed in visit on 08/30/23  Ferritin  Result Value Ref Range   Ferritin 138 24 - 336 ng/mL  Comprehensive metabolic panel  Result Value Ref Range   Sodium 140 135 - 145 mmol/L   Potassium 4.0 3.5 - 5.1 mmol/L   Chloride 102 98 - 111 mmol/L   CO2 25 22 - 32 mmol/L   Glucose, Bld 170  (H) 70 - 99 mg/dL   BUN 15 8 - 23 mg/dL   Creatinine, Ser 1.61 0.61 - 1.24 mg/dL   Calcium  9.7 8.9 - 10.3 mg/dL   Total Protein 7.6 6.5 - 8.1 g/dL   Albumin 4.4 3.5 - 5.0 g/dL   AST 48 (H) 15 - 41 U/L   ALT 50 (H) 0 - 44 U/L   Alkaline Phosphatase 111 38 - 126 U/L   Total Bilirubin 1.1 0.0 - 1.2 mg/dL   GFR, Estimated >09 >60 mL/min   Anion gap 13 5 - 15  CBC with Differential/Platelet  Result Value Ref Range   WBC 6.1 4.0 - 10.5 K/uL   RBC 4.84 4.22 - 5.81 MIL/uL   Hemoglobin 16.3 13.0 - 17.0 g/dL   HCT 16.1 09.6 - 04.5 %   MCV 98.1 80.0 - 100.0 fL   MCH 33.7 26.0 - 34.0 pg   MCHC 34.3 30.0 - 36.0 g/dL   RDW 40.9 81.1 - 91.4 %   Platelets 146 (L) 150 - 400 K/uL   nRBC 0.0 0.0 - 0.2 %   Neutrophils Relative % 65 %   Neutro Abs 4.0 1.7 - 7.7 K/uL   Lymphocytes Relative 16 %   Lymphs Abs 1.0 0.7 - 4.0 K/uL   Monocytes Relative 14 %   Monocytes Absolute 0.8 0.1 - 1.0 K/uL   Eosinophils Relative 4 %   Eosinophils Absolute 0.3 0.0 - 0.5 K/uL   Basophils Relative 1 %   Basophils Absolute 0.0 0.0 - 0.1 K/uL   Immature Granulocytes 0 %   Abs Immature Granulocytes 0.01 0.00 - 0.07 K/uL    RADIOGRAPHIC STUDIES:  No recent pertinent imaging studies available to review.  Orders Placed This Encounter  Procedures   CBC with Differential (Cancer Center Only)    Standing Status:   Future    Expected Date:   03/01/2024    Expiration Date:   08/29/2024   CMP (Cancer Center only)    Standing Status:   Future    Expected Date:   03/01/2024    Expiration Date:   08/29/2024   Iron and TIBC    Standing Status:   Future    Expected Date:   03/01/2024    Expiration Date:   08/29/2024   Ferritin    Standing Status:   Future    Expected Date:   03/01/2024    Expiration Date:   08/29/2024     Future Appointments  Date Time Provider Department Center  01/04/2024  8:15 AM [facility] 1 [facility] H&V  02/28/2024  8:00 AM [facility] PHLEBOTOMIST [facility] None  02/28/2024  8:30 AM Edel Rivero,  Gale Jude, MD [facility] None     This document was completed utilizing speech recognition software. Grammatical errors, random word insertions, pronoun errors, and incomplete sentences are an occasional consequence of this system due to software limitations, ambient noise, and hardware issues. Any formal questions or concerns about the content, text or information contained within the body of this dictation should be directly addressed to the provider for clarification.

## 2023-08-30 NOTE — Assessment & Plan Note (Signed)
 -  Has had multiple cardioversions and ablations in the past.   - On Eliquis for anticoagulation. Regular heart rate noted on examination. -Continue Eliquis as prescribed.

## 2023-08-30 NOTE — Assessment & Plan Note (Signed)
-  History of diagnosis in the 1990s with subsequent phlebotomies. No recent phlebotomies.  He apparently even had liver biopsy.    -On his initial consultation with us  on 02/01/2023, hemochromatosis testing revealed heterozygosity for C282Y and H63D mutations.  No evidence of S65C mutation.  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  Ferritin goal will be adjusted depending on rest of the phenotype/liver numbers.  Goal is to maintain ferritin at least below 200 with therapeutic phlebotomies as needed.  -On 02/01/2023, LFTs were normal except for slightly elevated ALT of 48.  Iron saturation was 24%, ferritin was 108.  -Cardiac MRI on 01/19/2023 showed no evidence of iron overload in the heart  -Labs today revealed grossly unremarkable CBCD and CMP.  Ferritin 138.  Iron studies pending.  No indication for therapeutic phlebotomy currently.  He has not needed therapeutic phlebotomy since we started seeing him in our clinic in October 2024.  -I will see him in 6 months with repeat labs including iron studies, LFTs.  If ferritin is approaching 200 or if iron saturation is approaching 50%, then we will plan for therapeutic phlebotomies.

## 2023-09-02 DIAGNOSIS — H35373 Puckering of macula, bilateral: Secondary | ICD-10-CM | POA: Diagnosis not present

## 2023-09-02 DIAGNOSIS — Z961 Presence of intraocular lens: Secondary | ICD-10-CM | POA: Diagnosis not present

## 2023-09-02 DIAGNOSIS — H52203 Unspecified astigmatism, bilateral: Secondary | ICD-10-CM | POA: Diagnosis not present

## 2023-09-02 DIAGNOSIS — H353131 Nonexudative age-related macular degeneration, bilateral, early dry stage: Secondary | ICD-10-CM | POA: Diagnosis not present

## 2023-10-18 ENCOUNTER — Encounter: Payer: Self-pay | Admitting: Cardiology

## 2023-11-15 ENCOUNTER — Telehealth: Payer: Self-pay | Admitting: Cardiology

## 2023-11-15 ENCOUNTER — Other Ambulatory Visit: Payer: Self-pay | Admitting: Surgery

## 2023-11-15 DIAGNOSIS — I77819 Aortic ectasia, unspecified site: Secondary | ICD-10-CM

## 2023-11-15 NOTE — Telephone Encounter (Signed)
 Patient states he received a letter in the mail requesting him to schedule a cardiac MRI ordered by Dr. Kate.  Order in chart was from 01/19/23 (same day as previous cardiac MRI). Result note does not mention repeating cardiac MRI in 1 year. Office visit note from Dr. Kate on 06/22/23 did not mention ordering repeat cardiac MRI.  Informed patient I will forward message to Dr. Kate for clarification on if repeat cardiac MRI is needed.

## 2023-11-15 NOTE — Telephone Encounter (Signed)
 Pt called stating he received a letter to schedule MRI velocity flow map. I do see the order however per MRI scheduler, these are usually placed as MR MRA CHEST W WO CONTRAST and the Velocity flow maps are added on or linked to this test at the readers request. Please advise if that order can be placed.

## 2023-11-15 NOTE — Telephone Encounter (Signed)
 No does not need repeat MRI

## 2023-11-21 NOTE — Telephone Encounter (Signed)
 Called patient and made aware that per Dr. Kate does not need to repeat MRI. Patient verbalized understanding and visit made for 6 month follow up.

## 2023-12-11 NOTE — Progress Notes (Unsigned)
 Cardiology Office Note:    Date:  12/13/2023   ID:  Randy Evans, DOB July 21, 1940, MRN [id number]  PCP:  Regino Slater, MD  Cardiologist:  Lonni LITTIE Nanas, MD  Electrophysiologist:  Danelle Birmingham, MD   Referring MD: Regino Slater, MD   Chief Complaint  Patient presents with   Atrial Fibrillation    History of Present Illness:    Randy Evans is a 82 y.o. male with a hx of atrial fibrillation/flutter, ascending aortic aneurysm, cerebral aneurysm status post coil embolization, hemochromatosis, gout who presents for follow-up.  Previously followed with Dr. [name].  Cardiac MRI 01/2023 showed LVEF 56%, no evidence of myocardial iron overload, LGE consistent with prior infarct in basal anterolateral and basal to mid inferolateral wall.  Since last clinic visit, he reports he is doing well.  Denies any chest pain, dyspnea, lightheadedness, syncope, or palpitations.  Reports occasional lower extremity edema, but has improved.  BP Readings from Last 3 Encounters:  12/13/23 (!) 150/80  08/30/23 138/81  06/22/23 130/82     Wt Readings from Last 3 Encounters:  12/13/23 192 lb (87.1 kg)  08/30/23 198 lb 11.2 oz (90.1 kg)  06/22/23 199 lb 3.2 oz (90.4 kg)     Past Medical History:  Diagnosis Date   Atrial fibrillation (HCC)    Onset 1991 in [place]    Cerebral aneurysm without rupture    treated with coil embolization 2006   Gout    Hemochromatosis    Has seen Norleen Hint   Hypertension     Past Surgical History:  Procedure Laterality Date   ANEURYSM COILING  2006   bone spur removal  1951   below left knee   BRAIN SURGERY     CARDIAC ELECTROPHYSIOLOGY MAPPING AND ABLATION  01/2008; 07/2008   CARDIOVERSION  2000; 2008 03/2008; ;02/15/2011   2008; 2009 2012:    Surgeon: LELON Jacques Blanca Mickey., MD;  Location: [facility] OR;  Service: Cardiovascular;  Laterality: N/A;   CARDIOVERSION  08/05/2011   Procedure: CARDIOVERSION;  Surgeon: Elsie GORMAN Blanca, MD;  Location: [facility] OR;   Service: Cardiovascular;  Laterality: N/A;   CATARACT EXTRACTION W/ INTRAOCULAR LENS  IMPLANT, BILATERAL  2009-2010   IR ANGIO INTRA EXTRACRAN SEL COM CAROTID INNOMINATE UNI L MOD SED  11/09/2016   IR ANGIO INTRA EXTRACRAN SEL COM CAROTID INNOMINATE UNI R MOD SED  07/17/2019   IR ANGIO INTRA EXTRACRAN SEL INTERNAL CAROTID UNI R MOD SED  11/09/2016   IR US  GUIDE VASC ACCESS RIGHT  07/17/2019   LIVER BIOPSY  late 1990's   TOE SURGERY      Current Medications: Current Meds  Medication Sig   acetaminophen  (TYLENOL ) 500 MG tablet Take 1,000 mg by mouth every 6 (six) hours as needed. pain   allopurinol  (ZYLOPRIM ) 300 MG tablet Take 300 mg by mouth daily.     amLODipine  (NORVASC ) 10 MG tablet TAKE 1 TABLET DAILY   apixaban  (ELIQUIS ) 5 MG TABS tablet TAKE 1 TABLET TWICE A DAY   Ascorbic Acid (VITAMIN C PO) Take 1 tablet by mouth daily.   atorvastatin  (LIPITOR) 10 MG tablet TAKE 1 TABLET BY MOUTH EVERY DAY   carvedilol  (COREG ) 6.25 MG tablet Take 1 tablet (6.25 mg total) by mouth 2 (two) times daily.   chlorpheniramine (CHLOR-TRIMETON) 4 MG tablet Take 4 mg by mouth 2 (two) times daily as needed. allergies   clobetasol cream (TEMOVATE) 0.05 % Apply topically daily.   FARXIGA 5 MG TABS tablet Take 5  mg by mouth daily.   ketoconazole  (NIZORAL ) 2 % cream Apply topically daily.   multivitamin-lutein  (OCUVITE-LUTEIN ) CAPS Take 1 capsule by mouth daily.     Omega-3 Fatty Acids (FISH OIL PO) Take 1 Capful by mouth daily.   valsartan (DIOVAN) 320 MG tablet Take 320 mg by mouth daily.   VITAMIN D , ERGOCALCIFEROL , PO Take 1 Capful by mouth daily.   [DISCONTINUED] metoprolol  succinate (TOPROL -XL) 50 MG 24 hr tablet TAKE 1 TABLET BY MOUTH EVERY DAY WITH OR IMMEDIATELY FOLLOWING A MEAL     Allergies:   Codeine and Sulfa antibiotics   Social History   Socioeconomic History   Marital status: Married    Spouse name: Not on file   Number of children: Not on file   Years of education: Not on file   Highest  education level: Not on file  Occupational History   Not on file  Tobacco Use   Smoking status: Former    Types: Cigarettes, Pipe, Cigars   Smokeless tobacco: Never   Tobacco comments:    quit smoking 1966  Substance and Sexual Activity   Alcohol use: Yes    Alcohol/week: 2.0 standard drinks of alcohol    Types: 2 Glasses of wine per week    Comment: SOCIAL   Drug use: No   Sexual activity: Yes    Partners: Female    Comment: MARRIED  Other Topics Concern   Not on file  Social History Narrative   Retired from [employer].  Moved from [place] . [employer] [facility].  Married.   Social Drivers of Corporate investment banker Strain: Not on file  Food Insecurity: No Food Insecurity (02/01/2023)   Hunger Vital Sign    Worried About Running Out of Food in the Last Year: Never true    Ran Out of Food in the Last Year: Never true  Transportation Needs: No Transportation Needs (02/01/2023)   PRAPARE - Administrator, Civil Service (Medical): No    Lack of Transportation (Non-Medical): No  Physical Activity: Not on file  Stress: Not on file  Social Connections: Not on file     Family History: The patient's family history is not on file.  ROS:   Please see the history of present illness.     All other systems reviewed and are negative.  EKGs/Labs/Other Studies Reviewed:    The following studies were reviewed today:   EKG:   01/05/2023: Sinus rhythm with first-degree AV block, rate 60, right bundle branch block, left anterior fascicular block 12/13/2023: Sinus bradycardia with first-degree AV block, PVC, right bundle branch block, left anterior fascicular block, rate 59  Recent Labs: 01/05/2023: BNP 64.4 08/30/2023: ALT 50; BUN 15; Creatinine, Ser 0.82; Hemoglobin 16.3; Platelets 146; Potassium 4.0; Sodium 140  Recent Lipid Panel No results found for: CHOL, TRIG, HDL, CHOLHDL, VLDL, LDLCALC, LDLDIRECT  Physical Exam:    VS:  BP (!) 150/80   Pulse (!) 59    Ht 5' 10 (1.778 m)   Wt 192 lb (87.1 kg)   SpO2 97%   BMI 27.55 kg/m     Wt Readings from Last 3 Encounters:  12/13/23 192 lb (87.1 kg)  08/30/23 198 lb 11.2 oz (90.1 kg)  06/22/23 199 lb 3.2 oz (90.4 kg)     GEN:  Well nourished, well developed in no acute distress HEENT: Normal NECK: No JVD; No carotid bruits CARDIAC: RRR, no murmurs, rubs, gallops RESPIRATORY:  Clear to auscultation without rales, wheezing  or rhonchi  ABDOMEN: Soft, non-tender, non-distended MUSCULOSKELETAL: Trace edema SKIN: Warm and dry NEUROLOGIC:  Alert and oriented x 3 PSYCHIATRIC:  Normal affect   ASSESSMENT:    1. Paroxysmal atrial fibrillation (HCC)   2. Aneurysm of ascending aorta without rupture (HCC)   3. Hemochromatosis, unspecified hemochromatosis type   4. CAD in native artery   5. Essential hypertension   6. Hyperlipidemia, unspecified hyperlipidemia type      PLAN:    Paroxysmal atrial fibrillation: Follows with EP.  Status post multiple ablations.  Previously has been on Tikosyn  but off antiarrhythmic therapy.  Echocardiogram 04/2018 showed EF 50 to 55%, grade 2 diastolic dysfunction, ascending aortic dilatation measuring 43 mm.  Appears to be maintaining sinus rhythm since his last ablation in 06/2018 -Continue Eliquis  5 mg twice daily -Continue beta-blocker, will switch from Toprol -XL to carvedilol  for better BP control  Aortic aneurysm: Measured 43 mm in ascending aorta on echo 04/2019.  CTA chest 10/2021 showed ascending aortic aneurysm measuring 46 mm.  MRI chest 01/2023 showed ascending aorta measured 42 mm, sinus of Valsalva measured 43 mm.  He follows with Dr. Lucas, planned for CTA chest tomorrow  CAD: Cardiac MRI 08/2019 showed subendocardial LGE consistent with prior infarct in basal to mid inferolateral wall and basal anterolateral wall.  Overall EF 66%.  Denies anginal symptoms -Continue Eliquis  5 mg twice daily -Continue atorvastatin  10 mg daily  Hypertension: On  amlodipine  10 mg daily and Toprol -XL 50 mg daily.  BP elevated, needs tighter BP control given his aortic aneurysm as above.  Recommend switching from Toprol -XL to carvedilol  6.25 mg twice daily.  Asked to check BP daily for next 2 weeks and let us  know results.  Hyperlipidemia: On atorvastatin  10 mg daily.  LDL 60 on 06/22/2023  Hemochromatosis: previously required weekly phlebotomy now only receiving rarely.  Reports diagnosis was confirmed with genetic testing in [place].   -Referred to hematology -Cardiac MRI 01/2023 showed no evidence of myocardial iron overload  Lower extremity edema: Suspect due to amlodipine  use.  Normal BNP, albumin 01/2023.  Has improved  RTC in 6 months   Medication Adjustments/Labs and Tests Ordered: Current medicines are reviewed at length with the patient today.  Concerns regarding medicines are outlined above.  Orders Placed This Encounter  Procedures   EKG 12-Lead   Meds ordered this encounter  Medications   carvedilol  (COREG ) 6.25 MG tablet    Sig: Take 1 tablet (6.25 mg total) by mouth 2 (two) times daily.    Dispense:  180 tablet    Refill:  3    Patient Instructions  Medication Instructions:  Your physician recommends that you continue on your current medications as directed. Please refer to the Current Medication list given to you today.   Start Coreg  6.25 twice a day Stop Metoprolol  as discussed with your provider *If you need a refill on your cardiac medications before your next appointment, please call your pharmacy*  Lab Work: none If you have labs (blood work) drawn today and your tests are completely normal, you will receive your results only by: MyChart Message (if you have MyChart) OR A paper copy in the mail If you have any lab test that is abnormal or we need to change your treatment, we will call you to review the results.  Testing/Procedures: none  Follow-Up: At [facility], you and your health needs are our  priority.  As part of our continuing mission to provide you with exceptional heart care,  our providers are all part of one team.  This team includes your primary Cardiologist (physician) and Advanced Practice Providers or APPs (Physician Assistants and Nurse Practitioners) who all work together to provide you with the care you need, when you need it.  Your next appointment:   6 month  Provider:   Dr. KATE  We recommend signing up for the patient portal called MyChart.  Sign up information is provided on this After Visit Summary.  MyChart is used to connect with patients for Virtual Visits (Telemedicine).  Patients are able to view lab/test results, encounter notes, upcoming appointments, etc.  Non-urgent messages can be sent to your provider as well.   To learn more about what you can do with MyChart, go to [email or web address].   Other Instructions Please check blood pressure once a day for next 2 weeks and send those reading  Your ECHO has been cancelled ad discussed with your provider       Signed, Lonni LITTIE KATE, MD  12/13/2023 9:42 AM    [facility]

## 2023-12-13 ENCOUNTER — Ambulatory Visit: Attending: Cardiology | Admitting: Cardiology

## 2023-12-13 ENCOUNTER — Encounter: Payer: Self-pay | Admitting: Cardiology

## 2023-12-13 VITALS — BP 150/80 | HR 59 | Ht 70.0 in | Wt 192.0 lb

## 2023-12-13 DIAGNOSIS — I7121 Aneurysm of the ascending aorta, without rupture: Secondary | ICD-10-CM | POA: Insufficient documentation

## 2023-12-13 DIAGNOSIS — I2510 Atherosclerotic heart disease of native coronary artery without angina pectoris: Secondary | ICD-10-CM | POA: Diagnosis not present

## 2023-12-13 DIAGNOSIS — E785 Hyperlipidemia, unspecified: Secondary | ICD-10-CM | POA: Insufficient documentation

## 2023-12-13 DIAGNOSIS — I480 Paroxysmal atrial fibrillation: Secondary | ICD-10-CM | POA: Diagnosis not present

## 2023-12-13 DIAGNOSIS — I10 Essential (primary) hypertension: Secondary | ICD-10-CM | POA: Diagnosis not present

## 2023-12-13 MED ORDER — CARVEDILOL 6.25 MG PO TABS: 6.2500 mg | ORAL_TABLET | Freq: Two times a day (BID) | ORAL | 3 refills | Status: AC

## 2023-12-13 NOTE — Patient Instructions (Signed)
 Medication Instructions:  Your physician recommends that you continue on your current medications as directed. Please refer to the Current Medication list given to you today.   Start Coreg  6.25 twice a day Stop Metoprolol  as discussed with your provider *If you need a refill on your cardiac medications before your next appointment, please call your pharmacy*  Lab Work: none If you have labs (blood work) drawn today and your tests are completely normal, you will receive your results only by: MyChart Message (if you have MyChart) OR A paper copy in the mail If you have any lab test that is abnormal or we need to change your treatment, we will call you to review the results.  Testing/Procedures: none  Follow-Up: At [facility], you and your health needs are our priority.  As part of our continuing mission to provide you with exceptional heart care, our providers are all part of one team.  This team includes your primary Cardiologist (physician) and Advanced Practice Providers or APPs (Physician Assistants and Nurse Practitioners) who all work together to provide you with the care you need, when you need it.  Your next appointment:   6 month  Provider:   Dr. KATE  We recommend signing up for the patient portal called MyChart.  Sign up information is provided on this After Visit Summary.  MyChart is used to connect with patients for Virtual Visits (Telemedicine).  Patients are able to view lab/test results, encounter notes, upcoming appointments, etc.  Non-urgent messages can be sent to your provider as well.   To learn more about what you can do with MyChart, go to [email or web address].   Other Instructions Please check blood pressure once a day for next 2 weeks and send those reading  Your ECHO has been cancelled ad discussed with your provider

## 2023-12-14 ENCOUNTER — Ambulatory Visit (HOSPITAL_COMMUNITY)
Admission: RE | Admit: 2023-12-14 | Discharge: 2023-12-14 | Disposition: A | Discharge status: Home / Self Care | Source: Ambulatory Visit | Attending: Surgery | Admitting: Surgery

## 2023-12-14 DIAGNOSIS — I77819 Aortic ectasia, unspecified site: Secondary | ICD-10-CM | POA: Insufficient documentation

## 2023-12-14 DIAGNOSIS — I700 Atherosclerosis of aorta: Secondary | ICD-10-CM | POA: Diagnosis not present

## 2023-12-14 DIAGNOSIS — I719 Aortic aneurysm of unspecified site, without rupture: Secondary | ICD-10-CM | POA: Diagnosis not present

## 2023-12-14 MED ORDER — IOHEXOL 350 MG/ML SOLN
100.0000 mL | Freq: Once | INTRAVENOUS | Status: AC | PRN
  Administered 2023-12-14: 100 mL via INTRAVENOUS

## 2023-12-20 DIAGNOSIS — Z85828 Personal history of other malignant neoplasm of skin: Secondary | ICD-10-CM | POA: Diagnosis not present

## 2023-12-20 DIAGNOSIS — L814 Other melanin hyperpigmentation: Secondary | ICD-10-CM | POA: Diagnosis not present

## 2023-12-20 DIAGNOSIS — L570 Actinic keratosis: Secondary | ICD-10-CM | POA: Diagnosis not present

## 2023-12-20 DIAGNOSIS — Z789 Other specified health status: Secondary | ICD-10-CM | POA: Diagnosis not present

## 2023-12-20 DIAGNOSIS — B078 Other viral warts: Secondary | ICD-10-CM | POA: Diagnosis not present

## 2023-12-20 DIAGNOSIS — L0889 Other specified local infections of the skin and subcutaneous tissue: Secondary | ICD-10-CM | POA: Diagnosis not present

## 2023-12-20 DIAGNOSIS — Z08 Encounter for follow-up examination after completed treatment for malignant neoplasm: Secondary | ICD-10-CM | POA: Diagnosis not present

## 2023-12-20 DIAGNOSIS — L821 Other seborrheic keratosis: Secondary | ICD-10-CM | POA: Diagnosis not present

## 2023-12-20 DIAGNOSIS — L538 Other specified erythematous conditions: Secondary | ICD-10-CM | POA: Diagnosis not present

## 2023-12-21 ENCOUNTER — Ambulatory Visit

## 2023-12-21 VITALS — BP 138/78 | HR 64 | Resp 20 | Ht 70.0 in | Wt 192.0 lb

## 2023-12-21 DIAGNOSIS — I77819 Aortic ectasia, unspecified site: Secondary | ICD-10-CM | POA: Insufficient documentation

## 2023-12-21 NOTE — Patient Instructions (Signed)
 Risk Modification in those with ascending thoracic aortic aneurysm:   Continue control of blood pressure (prefer BP 130/80 or less)   2. Avoid fluoroquinolone antibiotics (I.e Ciprofloxacin, Avelox, Levofloxacin, Ofloxacin)   3.  Use of statin (to decrease cardiovascular risk)   4.  Exercise and activity limitations is individualized, but in general, contact sports are to be avoided and one should avoid heavy lifting (defined as half of ideal body weight) and exercises involving sustained Valsalva maneuver.   5.  Follow-up in 2 years with CTA chest.

## 2023-12-21 NOTE — Progress Notes (Signed)
 [address] [place] [address]             [phone]            Randy Evans [id number] 09/29/1940   History of Present Illness:  [name] is an 83 year old man with medical history of hypertension, atrial fibrillation, non ruptured cerebral aneurysm, celiac disease, gout and hereditary hemochromatosis who presents for continued follow up of ascending thoracic aortic aneurysm.  Aneurysm has stabled in size since 2021 and measured 4.2 cm on recent CTA of chest.   He reports that he has been doing well.  His blood pressure is well controlled with current medication therapy.  He does exercise but denies heavy lifting.  He denies chest pain, shortness of breath and lower leg swelling.     Current Outpatient Medications on File Prior to Visit  Medication Sig Dispense Refill   acetaminophen  (TYLENOL ) 500 MG tablet Take 1,000 mg by mouth every 6 (six) hours as needed. pain     allopurinol  (ZYLOPRIM ) 300 MG tablet Take 300 mg by mouth daily.       amLODipine  (NORVASC ) 10 MG tablet TAKE 1 TABLET DAILY 90 tablet 3   apixaban  (ELIQUIS ) 5 MG TABS tablet TAKE 1 TABLET TWICE A DAY 180 tablet 2   Ascorbic Acid (VITAMIN C PO) Take 1 tablet by mouth daily.     atorvastatin  (LIPITOR) 10 MG tablet TAKE 1 TABLET BY MOUTH EVERY DAY 90 tablet 2   carvedilol  (COREG ) 6.25 MG tablet Take 1 tablet (6.25 mg total) by mouth 2 (two) times daily. 180 tablet 3   chlorpheniramine (CHLOR-TRIMETON) 4 MG tablet Take 4 mg by mouth 2 (two) times daily as needed. allergies     clobetasol cream (TEMOVATE) 0.05 % Apply topically daily.     FARXIGA 5 MG TABS tablet Take 5 mg by mouth daily.     ketoconazole  (NIZORAL ) 2 % cream Apply topically daily.     multivitamin-lutein  (OCUVITE-LUTEIN ) CAPS Take 1 capsule by mouth daily.       Omega-3 Fatty Acids (FISH OIL PO) Take 1 Capful by mouth daily.     valsartan (DIOVAN) 320 MG tablet Take 320 mg by mouth daily.     VITAMIN  D, ERGOCALCIFEROL , PO Take 1 Capful by mouth daily.     No current facility-administered medications on file prior to visit.     ROS: Review of Systems  Constitutional:  Negative for malaise/fatigue and weight loss.  Respiratory: Negative.  Negative for cough, shortness of breath and wheezing.   Cardiovascular: Negative.  Negative for chest pain, palpitations and leg swelling.  Musculoskeletal: Negative.  Negative for back pain.  Neurological: Negative.  Negative for headaches.     BP 138/78   Pulse 64   Resp 20   Ht 5' 10 (1.778 m)   Wt 192 lb (87.1 kg)   SpO2 96% Comment: RA  BMI 27.55 kg/m   Physical Exam Constitutional:      Appearance: Normal appearance.  HENT:     Head: Normocephalic and atraumatic.  Cardiovascular:     Rate and Rhythm: Normal rate and regular rhythm.     Heart sounds: Normal heart sounds, S1 normal and S2 normal.  Pulmonary:     Effort: Pulmonary effort is normal.     Breath sounds: Normal breath sounds.  Skin:    General: Skin is warm and dry.  Neurological:  General: No focal deficit present.     Mental Status: He is alert and oriented to person, place, and time.      Imaging: CLINICAL DATA:  Aortic aneurysm suspected   EXAM: CT ANGIOGRAPHY CHEST WITH CONTRAST   TECHNIQUE: Multidetector CT imaging through the chest was performed using the standard protocol during bolus administration of intravenous contrast. Multiplanar reconstructed images and MIPs were obtained and reviewed to evaluate the vascular anatomy.   RADIATION DOSE REDUCTION: This exam was performed according to the departmental dose-optimization program which includes automated exposure control, adjustment of the mA and/or kV according to patient size and/or use of iterative reconstruction technique.   CONTRAST:  OMNIPAQUE  IOHEXOL  350 MG/ML SOLN   COMPARISON:  10/09/2021   FINDINGS: Cardiovascular: No cardiomegaly or pericardial effusion.  Similar fusiform dilation of the ascending aorta, measuring 4.2 cm. Similar tortuosity of the aortic arch with fusiform dilation of the distal arch/proximal descending aorta, measuring 3.6 cm in AP dimension.   Mediastinum/Nodes: No mediastinal mass. No mediastinal, hilar, or axillary lymphadenopathy.   Lungs/Pleura: The midline trachea and bronchi are patent. No focal airspace consolidation, pleural effusion, or pneumothorax.   Musculoskeletal: No acute fracture or destructive bone lesion. Multilevel degenerative disc disease of the spine. Thoracic DISH. Osteopenia.   Upper Abdomen: No acute abnormality within the partially visualized upper abdomen. Redemonstrated cirrhotic morphology of the liver.   Review of the MIP images confirms the above findings.   IMPRESSION: 1. Redemonstrated fusiform aneurysm of the ascending aorta, which measures 4.2 cm. The distal arch/proximal descending aorta is also demonstrates similar dilation, measuring 3.6 cm. Continued follow-up could be considered, as documented below. 2. No pneumonia, pulmonary edema, or pleural effusion. 3. Cirrhotic morphology of the liver again noted.   Recommend annual imaging followup by CTA or MRA. This recommendation follows 2010 ACCF/AHA/AATS/ACR/ASA/SCA/SCAI/SIR/STS/SVM Guidelines for the Diagnosis and Management of Patients with Thoracic Aortic Disease. Circulation. 2010; 121: [id number]. Aortic aneurysm NOS (ICD10-I71.9)   Aortic Atherosclerosis (ICD10-I70.0).     Electronically Signed   By: Rogelia Myers M.D.   On: 12/14/2023 11:47     A/P: Aortic dilatation (HCC) -4.2 ascending thoracic aortic aneurysm on CTA of chest. Echocardiogram from 04/2018 showed tri leaflet aortic valve. We discussed the natural history and and risk factors for growth of ascending aortic aneurysms. Discussed recommendations to minimize the risk of further expansion or dissection including careful blood pressure control,  avoidance of contact sports and heavy lifting, attention to lipid management.  We covered the importance of continued smoking cessation.  The patient does not yet meet surgical criteria of >5.5cm. The patient is aware of signs and symptoms of aortic dissection and when to present to the emergency department    -Follow up in 2 years with CTA of chest for continued surveillance   Risk Modification:  Statin:  atorvastatin    Smoking cessation instruction/counseling given:  commended patient for quitting and reviewed strategies for preventing relapses  Patient was counseled on importance of Blood Pressure Control  They are instructed to contact their Primary Care Physician if they start to have blood pressure readings over 130s/90s. Do not ever stop blood pressure medications on your own, unless instructed by healthcare professional.  Please avoid use of Fluoroquinolones as this can potentially increase your risk of Aortic Rupture and/or Dissection  Patient educated on signs and symptoms of Aortic Dissection, handout also provided in AVS  Manuelita CHRISTELLA Rough, PA-C 12/21/23

## 2024-01-02 DIAGNOSIS — Z23 Encounter for immunization: Secondary | ICD-10-CM | POA: Diagnosis not present

## 2024-01-04 ENCOUNTER — Other Ambulatory Visit (HOSPITAL_COMMUNITY): Payer: Medicare Other

## 2024-01-11 ENCOUNTER — Encounter: Payer: Self-pay | Admitting: Cardiology

## 2024-01-12 NOTE — Telephone Encounter (Signed)
 Recommend adding losartan  25 mg daily.  Check BMET in 1 week.  Would ask to check BP daily for next 2 weeks and let us  know results

## 2024-01-13 ENCOUNTER — Other Ambulatory Visit: Payer: Self-pay | Admitting: *Deleted

## 2024-01-13 DIAGNOSIS — I10 Essential (primary) hypertension: Secondary | ICD-10-CM

## 2024-01-13 NOTE — Progress Notes (Signed)
 Made patient aware per Dr. Kate to check blood pressure twice daily for one week and send those reading. PT verbalized an understanding

## 2024-01-17 DIAGNOSIS — L0889 Other specified local infections of the skin and subcutaneous tissue: Secondary | ICD-10-CM | POA: Diagnosis not present

## 2024-01-17 DIAGNOSIS — L538 Other specified erythematous conditions: Secondary | ICD-10-CM | POA: Diagnosis not present

## 2024-01-17 DIAGNOSIS — Z789 Other specified health status: Secondary | ICD-10-CM | POA: Diagnosis not present

## 2024-01-17 DIAGNOSIS — B078 Other viral warts: Secondary | ICD-10-CM | POA: Diagnosis not present

## 2024-01-18 NOTE — Telephone Encounter (Signed)
 He is already on valsartan, does not need losartan 

## 2024-01-22 ENCOUNTER — Other Ambulatory Visit: Payer: Self-pay | Admitting: Nurse Practitioner

## 2024-01-25 NOTE — Telephone Encounter (Signed)
 Lets bring him in for an appointment, looks like there are openings with me on 11/5 or 11/6- can we schedule him one of those days?

## 2024-01-26 NOTE — Telephone Encounter (Signed)
 Called patient appt scheduled for 11/5 @ 2:00. Made pt aware to call office for any questions. Pt verbalized an understanding.

## 2024-01-27 DIAGNOSIS — I4892 Unspecified atrial flutter: Secondary | ICD-10-CM | POA: Diagnosis not present

## 2024-01-27 DIAGNOSIS — I10 Essential (primary) hypertension: Secondary | ICD-10-CM | POA: Diagnosis not present

## 2024-01-27 DIAGNOSIS — M109 Gout, unspecified: Secondary | ICD-10-CM | POA: Diagnosis not present

## 2024-01-27 DIAGNOSIS — Z79899 Other long term (current) drug therapy: Secondary | ICD-10-CM | POA: Diagnosis not present

## 2024-01-27 DIAGNOSIS — E119 Type 2 diabetes mellitus without complications: Secondary | ICD-10-CM | POA: Diagnosis not present

## 2024-02-03 DIAGNOSIS — R932 Abnormal findings on diagnostic imaging of liver and biliary tract: Secondary | ICD-10-CM | POA: Diagnosis not present

## 2024-02-03 DIAGNOSIS — R160 Hepatomegaly, not elsewhere classified: Secondary | ICD-10-CM | POA: Diagnosis not present

## 2024-02-03 DIAGNOSIS — R939 Diagnostic imaging inconclusive due to excess body fat of patient: Secondary | ICD-10-CM | POA: Diagnosis not present

## 2024-02-05 NOTE — Progress Notes (Unsigned)
 Cardiology Office Note:    Date:  02/08/2024   ID:  Randy Evans, DOB Sep 29, 1940, MRN [id number]  PCP:  Regino Slater, MD  Cardiologist:  Lonni LITTIE Nanas, MD  Electrophysiologist:  Danelle Birmingham, MD   Referring MD: Regino Slater, MD   Chief Complaint  Patient presents with   Atrial Fibrillation    History of Present Illness:    Randy Evans is a 83 y.o. male with a hx of atrial fibrillation/flutter, ascending aortic aneurysm, cerebral aneurysm status post coil embolization, hemochromatosis, gout who presents for follow-up.  Previously followed with Dr. [name].  Cardiac MRI 01/2023 showed LVEF 56%, no evidence of myocardial iron overload, LGE consistent with prior infarct in basal anterolateral and basal to mid inferolateral wall.  Since last clinic visit, he reports he is doing okay.  Does report he had some atrial fibrillation.  Also reports has been having chest pain, describes as discomfort in his upper chest, no clear relationship with exertion.  However does report he is noticed that he tires out more frequently.  He reports some lightheadedness but denies any syncope.  Continues to have some swelling in his legs.  He denies any palpitations.  BP Readings from Last 3 Encounters:  02/08/24 128/72  12/21/23 138/78  12/13/23 (!) 150/80     Wt Readings from Last 3 Encounters:  02/08/24 192 lb 11.2 oz (87.4 kg)  12/21/23 192 lb (87.1 kg)  12/13/23 192 lb (87.1 kg)     Past Medical History:  Diagnosis Date   Atrial fibrillation (HCC)    Onset 1991 in [place]    Cerebral aneurysm without rupture    treated with coil embolization 2006   Gout    Hemochromatosis    Has seen Norleen Hint   Hypertension     Past Surgical History:  Procedure Laterality Date   ANEURYSM COILING  2006   bone spur removal  1951   below left knee   BRAIN SURGERY     CARDIAC ELECTROPHYSIOLOGY MAPPING AND ABLATION  01/2008; 07/2008   CARDIOVERSION  2000; 2008 03/2008; ;02/15/2011    2008; 2009 2012:    Surgeon: LELON Jacques Blanca Mickey., MD;  Location: [facility] OR;  Service: Cardiovascular;  Laterality: N/A;   CARDIOVERSION  08/05/2011   Procedure: CARDIOVERSION;  Surgeon: Elsie GORMAN Blanca, MD;  Location: [facility] OR;  Service: Cardiovascular;  Laterality: N/A;   CATARACT EXTRACTION W/ INTRAOCULAR LENS  IMPLANT, BILATERAL  2009-2010   IR ANGIO INTRA EXTRACRAN SEL COM CAROTID INNOMINATE UNI L MOD SED  11/09/2016   IR ANGIO INTRA EXTRACRAN SEL COM CAROTID INNOMINATE UNI R MOD SED  07/17/2019   IR ANGIO INTRA EXTRACRAN SEL INTERNAL CAROTID UNI R MOD SED  11/09/2016   IR US  GUIDE VASC ACCESS RIGHT  07/17/2019   LIVER BIOPSY  late 1990's   TOE SURGERY      Current Medications: Current Meds  Medication Sig   acetaminophen  (TYLENOL ) 500 MG tablet Take 1,000 mg by mouth every 6 (six) hours as needed. pain   allopurinol  (ZYLOPRIM ) 300 MG tablet Take 300 mg by mouth daily.     amLODipine  (NORVASC ) 10 MG tablet TAKE 1 TABLET DAILY   apixaban  (ELIQUIS ) 5 MG TABS tablet TAKE 1 TABLET TWICE A DAY   Ascorbic Acid (VITAMIN C PO) Take 1 tablet by mouth daily.   atorvastatin  (LIPITOR) 10 MG tablet TAKE 1 TABLET BY MOUTH EVERY DAY   carvedilol  (COREG ) 6.25 MG tablet Take 1 tablet (6.25 mg total) by  mouth 2 (two) times daily.   chlorpheniramine (CHLOR-TRIMETON) 4 MG tablet Take 4 mg by mouth 2 (two) times daily as needed. allergies   clobetasol cream (TEMOVATE) 0.05 % Apply topically daily.   FARXIGA 5 MG TABS tablet Take 5 mg by mouth daily.   ketoconazole  (NIZORAL ) 2 % cream Apply topically daily.   multivitamin-lutein  (OCUVITE-LUTEIN ) CAPS Take 1 capsule by mouth daily.     Omega-3 Fatty Acids (FISH OIL PO) Take 1 Capful by mouth daily.   valsartan (DIOVAN) 320 MG tablet Take 320 mg by mouth daily.   VITAMIN D , ERGOCALCIFEROL , PO Take 1 Capful by mouth daily.     Allergies:   Codeine, Quinolones, and Sulfa antibiotics   Social History   Socioeconomic History   Marital status: Married    Spouse  name: Not on file   Number of children: Not on file   Years of education: Not on file   Highest education level: Not on file  Occupational History   Not on file  Tobacco Use   Smoking status: Former    Types: Cigarettes, Pipe, Cigars   Smokeless tobacco: Never   Tobacco comments:    quit smoking 1966  Substance and Sexual Activity   Alcohol use: Yes    Alcohol/week: 2.0 standard drinks of alcohol    Types: 2 Glasses of wine per week    Comment: SOCIAL   Drug use: No   Sexual activity: Yes    Partners: Female    Comment: MARRIED  Other Topics Concern   Not on file  Social History Narrative   Retired from [employer].  Moved from [place] . Graduate of [employer].  Married.   Social Drivers of Corporate Investment Banker Strain: Not on file  Food Insecurity: No Food Insecurity (02/01/2023)   Hunger Vital Sign    Worried About Running Out of Food in the Last Year: Never true    Ran Out of Food in the Last Year: Never true  Transportation Needs: No Transportation Needs (02/01/2023)   PRAPARE - Administrator, Civil Service (Medical): No    Lack of Transportation (Non-Medical): No  Physical Activity: Not on file  Stress: Not on file  Social Connections: Not on file     Family History: The patient's family history is not on file.  ROS:   Please see the history of present illness.     All other systems reviewed and are negative.  EKGs/Labs/Other Studies Reviewed:    The following studies were reviewed today:   EKG:   01/05/2023: Sinus rhythm with first-degree AV block, rate 60, right bundle branch block, left anterior fascicular block 12/13/2023: Sinus bradycardia with first-degree AV block, PVC, right bundle branch block, left anterior fascicular block, rate 59  Recent Labs: 08/30/2023: ALT 50; BUN 15; Creatinine, Ser 0.82; Hemoglobin 16.3; Platelets 146; Potassium 4.0; Sodium 140  Recent Lipid Panel No results found for: CHOL, TRIG, HDL, CHOLHDL, VLDL,  LDLCALC, LDLDIRECT  Physical Exam:    VS:  BP 128/72 (BP Location: Right Arm, Patient Position: Sitting, Cuff Size: Normal)   Pulse 60   Ht 5' 9 (1.753 m)   Wt 192 lb 11.2 oz (87.4 kg)   SpO2 95%   BMI 28.46 kg/m     Wt Readings from Last 3 Encounters:  02/08/24 192 lb 11.2 oz (87.4 kg)  12/21/23 192 lb (87.1 kg)  12/13/23 192 lb (87.1 kg)     GEN:  Well nourished, well developed in  no acute distress HEENT: Normal NECK: No JVD; No carotid bruits CARDIAC: RRR, no murmurs, rubs, gallops RESPIRATORY:  Clear to auscultation without rales, wheezing or rhonchi  ABDOMEN: Soft, non-tender, non-distended MUSCULOSKELETAL: Trace edema SKIN: Warm and dry NEUROLOGIC:  Alert and oriented x 3 PSYCHIATRIC:  Normal affect   ASSESSMENT:    1. Precordial pain   2. CAD in native artery   3. Atrial fibrillation, unspecified type (HCC)   4. Aneurysm of ascending aorta without rupture   5. Essential hypertension   6. Hyperlipidemia, unspecified hyperlipidemia type   7. Hemochromatosis, unspecified hemochromatosis type   8. Lower extremity edema      PLAN:    CAD: Cardiac MRI 08/2019 showed subendocardial LGE consistent with prior infarct in basal to mid inferolateral wall and basal anterolateral wall.  Overall EF 66%.   -Continue Eliquis  5 mg twice daily -Continue atorvastatin  10 mg daily - Previously had denied any anginal symptoms but now reporting intermittent chest pain.  Will evaluate with stress PET  Paroxysmal atrial fibrillation: Follows with EP.  Status post multiple ablations.  Previously has been on Tikosyn  but off antiarrhythmic therapy.  Echocardiogram 04/2018 showed EF 50 to 55%, grade 2 diastolic dysfunction, ascending aortic dilatation measuring 43 mm.  Appears to be maintaining sinus rhythm since his last ablation in 06/2018 -Continue Eliquis  5 mg twice daily -Continue carvedilol  - Reports has felt like had recent episodes of A-fib, evaluate with Zio patch x 2  weeks  Aortic aneurysm: Measured 43 mm in ascending aorta on echo 04/2019.  CTA chest 10/2021 showed ascending aortic aneurysm measuring 46 mm.  MRI chest 01/2023 showed ascending aorta measured 42 mm, sinus of Valsalva measured 43 mm.  He follows with Dr. Lucas, CTA chest 12/2023 showed ascending aortic dilatation measuring 42 mm  Hypertension: On amlodipine  10 mg daily and carvedilol  6.25 mg twice daily and valsartan 320 mg daily.  Appears controlled  Hyperlipidemia: On atorvastatin  10 mg daily.  LDL 60 on 06/22/2023  Hemochromatosis: previously required weekly phlebotomy now only receiving rarely.  Reports diagnosis was confirmed with genetic testing in [place].   -Referred to hematology -Cardiac MRI 01/2023 showed no evidence of myocardial iron overload  Lower extremity edema: Suspect due to amlodipine  use.  Normal BNP, albumin 01/2023.  Has improved  RTC in 3 months  Informed Consent   Shared Decision Making/Informed Consent The risks [chest pain, shortness of breath, cardiac arrhythmias, dizziness, blood pressure fluctuations, myocardial infarction, stroke/transient ischemic attack, nausea, vomiting, allergic reaction, radiation exposure, metallic taste sensation and life-threatening complications (estimated to be 1 in 10,000)], benefits (risk stratification, diagnosing coronary artery disease, treatment guidance) and alternatives of a cardiac PET stress test were discussed in detail with Mr. Mcshan and he agrees to proceed.       Medication Adjustments/Labs and Tests Ordered: Current medicines are reviewed at length with the patient today.  Concerns regarding medicines are outlined above.  Orders Placed This Encounter  Procedures   NM PET CT CARDIAC PERFUSION MULTI W/ABSOLUTE BLOODFLOW   Cardiac Stress Test: Informed Consent Details: Physician/Practitioner Attestation; Transcribe to consent form and obtain patient signature   LONG TERM MONITOR (3-14 DAYS)   No orders of the  defined types were placed in this encounter.   Patient Instructions  Medication Instructions:  Your physician recommends that you continue on your current medications as directed. Please refer to the Current Medication list given to you today.  *If you need a refill on your cardiac medications before your next  appointment, please call your pharmacy*  Lab Work: None ordered.  If you have labs (blood work) drawn today and your tests are completely normal, you will receive your results only by: MyChart Message (if you have MyChart) OR A paper copy in the mail If you have any lab test that is abnormal or we need to change your treatment, we will call you to review the results.  Testing/Procedures:    Please report to Radiology at the [facility] Main Entrance 30 minutes early for your test.  [address] [place], [place] [address]         How to Prepare for Your Cardiac PET/CT Stress Test:  Nothing to eat or drink, except water, 3 hours prior to arrival time.  NO caffeine/decaffeinated products, or chocolate 12 hours prior to arrival. (Please note decaffeinated beverages (teas/coffees) still contain caffeine).  If you have caffeine within 12 hours prior, the test will need to be rescheduled.  Medication instructions: Do not take erectile dysfunction medications for 72 hours prior to test (sildenafil, tadalafil) Do not take nitrates (isosorbide mononitrate, Ranexa) the day before or day of test Do not take tamsulosin the day before or morning of test Hold theophylline containing medications for 12 hours. Hold Dipyridamole 48 hours prior to the test.  Diabetic Preparation: If able to eat breakfast prior to 3 hour fasting, you may take all medications, including your insulin. Do not worry if you miss your breakfast dose of insulin - start at your next meal. If you do not eat prior to 3 hour fast-Hold all diabetes (oral and insulin) medications. Patients who wear a  continuous glucose monitor MUST remove the device prior to scanning.  You may take your remaining medications with water.  NO perfume, cologne or lotion on chest or abdomen area. FEMALES - Please avoid wearing dresses to this appointment.  Total time is 1 to 2 hours; you may want to bring reading material for the waiting time.  IF YOU THINK YOU MAY BE PREGNANT, OR ARE NURSING PLEASE INFORM THE TECHNOLOGIST.  In preparation for your appointment, medication and supplies will be purchased.  Appointment availability is limited, so if you need to cancel or reschedule, please call the Radiology Department Scheduler at [phone] 24 hours in advance to avoid a cancellation fee of $100.00  What to Expect When you Arrive:  Once you arrive and check in for your appointment, you will be taken to a preparation room within the Radiology Department.  A technologist or Nurse will obtain your medical history, verify that you are correctly prepped for the exam, and explain the procedure.  Afterwards, an IV will be started in your arm and electrodes will be placed on your skin for EKG monitoring during the stress portion of the exam. Then you will be escorted to the PET/CT scanner.  There, staff will get you positioned on the scanner and obtain a blood pressure and EKG.  During the exam, you will continue to be connected to the EKG and blood pressure machines.  A small, safe amount of a radioactive tracer will be injected in your IV to obtain a series of pictures of your heart along with an injection of a stress agent.    After your Exam:  It is recommended that you eat a meal and drink a caffeinated beverage to counter act any effects of the stress agent.  Drink plenty of fluids for the remainder of the day and urinate frequently for the first  couple of hours after the exam.  Your doctor will inform you of your test results within 7-10 business days.  For more information and frequently asked questions, please  visit our website: [email or web address]  For questions about your test or how to prepare for your test, please call: Cardiac Imaging Nurse Navigators Office: [phone]   Follow-Up: At [facility], you and your health needs are our priority.  As part of our continuing mission to provide you with exceptional heart care, our providers are all part of one team.  This team includes your primary Cardiologist (physician) and Advanced Practice Providers or APPs (Physician Assistants and Nurse Practitioners) who all work together to provide you with the care you need, when you need it.  Your next appointment:   3 months with Dr Kate  We recommend signing up for the patient portal called MyChart.  Sign up information is provided on this After Visit Summary.  MyChart is used to connect with patients for Virtual Visits (Telemedicine).  Patients are able to view lab/test results, encounter notes, upcoming appointments, etc.  Non-urgent messages can be sent to your provider as well.   To learn more about what you can do with MyChart, go to [email or web address].   Other Instructions ZIO XT- Long Term Monitor Instructions  Your physician has requested you wear a ZIO patch monitor for 14 days.  This is a single patch monitor. [employer] supplies one patch monitor per enrollment. Additional stickers are not available. Please do not apply patch if you will be having a Nuclear Stress Test,  Echocardiogram, Cardiac CT, MRI, or Chest Xray during the period you would be wearing the  monitor. The patch cannot be worn during these tests. You cannot remove and re-apply the  ZIO XT patch monitor.  Your ZIO patch monitor will be mailed 3 day USPS to your address on file. It may take 3-5 days  to receive your monitor after you have been enrolled.  Once you have received your monitor, please review the enclosed instructions. Your monitor  has already been registered assigning a  specific monitor serial # to you.  Billing and Patient Assistance Program Information  We have supplied [employer] with any of your insurance information on file for billing purposes. [employer] offers a sliding scale Patient Assistance Program for patients that do not have  insurance, or whose insurance does not completely cover the cost of the ZIO monitor.  You must apply for the Patient Assistance Program to qualify for this discounted rate.  To apply, please call [employer] at [phone], select option 4, select option 2, ask to apply for  Patient Assistance Program. Meredeth will ask your household income, and how many people  are in your household. They will quote your out-of-pocket cost based on that information.  [employer] will also be able to set up a 42-month, interest-free payment plan if needed.  Applying the monitor   Shave hair from upper left chest.  Hold abrader disc by orange tab. Rub abrader in 40 strokes over the upper left chest as  indicated in your monitor instructions.  Clean area with 4 enclosed alcohol pads. Let dry.  Apply patch as indicated in monitor instructions. Patch will be placed under collarbone on left  side of chest with arrow pointing upward.  Rub patch adhesive wings for 2 minutes. Remove white label marked 1. Remove the white  label marked 2. Rub patch adhesive wings for 2 additional minutes.  While looking in a mirror, press and  release button in center of patch. A small green light will  flash 3-4 times. This will be your only indicator that the monitor has been turned on.  Do not shower for the first 24 hours. You may shower after the first 24 hours.  Press the button if you feel a symptom. You will hear a small click. Record Date, Time and  Symptom in the Patient Logbook.  When you are ready to remove the patch, follow instructions on the last 2 pages of Patient  Logbook. Stick patch monitor onto the last page of Patient Logbook.  Place Patient  Logbook in the blue and white box. Use locking tab on box and tape box closed  securely. The blue and white box has prepaid postage on it. Please place it in the mailbox as  soon as possible. Your physician should have your test results approximately 7 days after the  monitor has been mailed back to [facility].  Call [facility] Customer Care at [phone] if you have questions regarding  your ZIO XT patch monitor. Call them immediately if you see an orange light blinking on your  monitor.  If your monitor falls off in less than 4 days, contact our Monitor department at [phone].  If your monitor becomes loose or falls off after 4 days call [employer] at [phone] for  suggestions on securing your monitor            Signed, Lonni LITTIE Nanas, MD  02/08/2024 5:27 PM    [facility]

## 2024-02-08 ENCOUNTER — Encounter: Payer: Self-pay | Admitting: Cardiology

## 2024-02-08 ENCOUNTER — Ambulatory Visit

## 2024-02-08 ENCOUNTER — Ambulatory Visit: Attending: Cardiology | Admitting: Cardiology

## 2024-02-08 VITALS — BP 128/72 | HR 60 | Ht 69.0 in | Wt 192.7 lb

## 2024-02-08 DIAGNOSIS — R072 Precordial pain: Secondary | ICD-10-CM | POA: Insufficient documentation

## 2024-02-08 DIAGNOSIS — I10 Essential (primary) hypertension: Secondary | ICD-10-CM | POA: Diagnosis not present

## 2024-02-08 DIAGNOSIS — R600 Localized edema: Secondary | ICD-10-CM | POA: Diagnosis not present

## 2024-02-08 DIAGNOSIS — I7121 Aneurysm of the ascending aorta, without rupture: Secondary | ICD-10-CM | POA: Insufficient documentation

## 2024-02-08 DIAGNOSIS — E785 Hyperlipidemia, unspecified: Secondary | ICD-10-CM | POA: Insufficient documentation

## 2024-02-08 DIAGNOSIS — I4891 Unspecified atrial fibrillation: Secondary | ICD-10-CM

## 2024-02-08 DIAGNOSIS — I2510 Atherosclerotic heart disease of native coronary artery without angina pectoris: Secondary | ICD-10-CM | POA: Diagnosis not present

## 2024-02-08 NOTE — Patient Instructions (Signed)
 Medication Instructions:  Your physician recommends that you continue on your current medications as directed. Please refer to the Current Medication list given to you today.  *If you need a refill on your cardiac medications before your next appointment, please call your pharmacy*  Lab Work: None ordered.  If you have labs (blood work) drawn today and your tests are completely normal, you will receive your results only by: MyChart Message (if you have MyChart) OR A paper copy in the mail If you have any lab test that is abnormal or we need to change your treatment, we will call you to review the results.  Testing/Procedures:    Please report to Radiology at the [facility] Main Entrance 30 minutes early for your test.  [address] [place], [place] [address]         How to Prepare for Your Cardiac PET/CT Stress Test:  Nothing to eat or drink, except water, 3 hours prior to arrival time.  NO caffeine/decaffeinated products, or chocolate 12 hours prior to arrival. (Please note decaffeinated beverages (teas/coffees) still contain caffeine).  If you have caffeine within 12 hours prior, the test will need to be rescheduled.  Medication instructions: Do not take erectile dysfunction medications for 72 hours prior to test (sildenafil, tadalafil) Do not take nitrates (isosorbide mononitrate, Ranexa) the day before or day of test Do not take tamsulosin the day before or morning of test Hold theophylline containing medications for 12 hours. Hold Dipyridamole 48 hours prior to the test.  Diabetic Preparation: If able to eat breakfast prior to 3 hour fasting, you may take all medications, including your insulin. Do not worry if you miss your breakfast dose of insulin - start at your next meal. If you do not eat prior to 3 hour fast-Hold all diabetes (oral and insulin) medications. Patients who wear a continuous glucose monitor MUST remove the device prior to scanning.  You  may take your remaining medications with water.  NO perfume, cologne or lotion on chest or abdomen area. FEMALES - Please avoid wearing dresses to this appointment.  Total time is 1 to 2 hours; you may want to bring reading material for the waiting time.  IF YOU THINK YOU MAY BE PREGNANT, OR ARE NURSING PLEASE INFORM THE TECHNOLOGIST.  In preparation for your appointment, medication and supplies will be purchased.  Appointment availability is limited, so if you need to cancel or reschedule, please call the Radiology Department Scheduler at [phone] 24 hours in advance to avoid a cancellation fee of $100.00  What to Expect When you Arrive:  Once you arrive and check in for your appointment, you will be taken to a preparation room within the Radiology Department.  A technologist or Nurse will obtain your medical history, verify that you are correctly prepped for the exam, and explain the procedure.  Afterwards, an IV will be started in your arm and electrodes will be placed on your skin for EKG monitoring during the stress portion of the exam. Then you will be escorted to the PET/CT scanner.  There, staff will get you positioned on the scanner and obtain a blood pressure and EKG.  During the exam, you will continue to be connected to the EKG and blood pressure machines.  A small, safe amount of a radioactive tracer will be injected in your IV to obtain a series of pictures of your heart along with an injection of a stress agent.    After your Exam:  It is recommended that you eat a meal and drink a caffeinated beverage to counter act any effects of the stress agent.  Drink plenty of fluids for the remainder of the day and urinate frequently for the first couple of hours after the exam.  Your doctor will inform you of your test results within 7-10 business days.  For more information and frequently asked questions, please visit our website: [email or web address]  For questions  about your test or how to prepare for your test, please call: Cardiac Imaging Nurse Navigators Office: [phone]   Follow-Up: At [facility], you and your health needs are our priority.  As part of our continuing mission to provide you with exceptional heart care, our providers are all part of one team.  This team includes your primary Cardiologist (physician) and Advanced Practice Providers or APPs (Physician Assistants and Nurse Practitioners) who all work together to provide you with the care you need, when you need it.  Your next appointment:   3 months with Dr Kate  We recommend signing up for the patient portal called MyChart.  Sign up information is provided on this After Visit Summary.  MyChart is used to connect with patients for Virtual Visits (Telemedicine).  Patients are able to view lab/test results, encounter notes, upcoming appointments, etc.  Non-urgent messages can be sent to your provider as well.   To learn more about what you can do with MyChart, go to [email or web address].   Other Instructions ZIO XT- Long Term Monitor Instructions  Your physician has requested you wear a ZIO patch monitor for 14 days.  This is a single patch monitor. [employer] supplies one patch monitor per enrollment. Additional stickers are not available. Please do not apply patch if you will be having a Nuclear Stress Test,  Echocardiogram, Cardiac CT, MRI, or Chest Xray during the period you would be wearing the  monitor. The patch cannot be worn during these tests. You cannot remove and re-apply the  ZIO XT patch monitor.  Your ZIO patch monitor will be mailed 3 day USPS to your address on file. It may take 3-5 days  to receive your monitor after you have been enrolled.  Once you have received your monitor, please review the enclosed instructions. Your monitor  has already been registered assigning a specific monitor serial # to you.  Billing and Patient Assistance Program  Information  We have supplied [employer] with any of your insurance information on file for billing purposes. [employer] offers a sliding scale Patient Assistance Program for patients that do not have  insurance, or whose insurance does not completely cover the cost of the ZIO monitor.  You must apply for the Patient Assistance Program to qualify for this discounted rate.  To apply, please call [employer] at [phone], select option 4, select option 2, ask to apply for  Patient Assistance Program. Meredeth will ask your household income, and how many people  are in your household. They will quote your out-of-pocket cost based on that information.  [employer] will also be able to set up a 40-month, interest-free payment plan if needed.  Applying the monitor   Shave hair from upper left chest.  Hold abrader disc by orange tab. Rub abrader in 40 strokes over the upper left chest as  indicated in your monitor instructions.  Clean area with 4 enclosed alcohol pads. Let dry.  Apply patch as indicated in monitor instructions. Patch will be placed under collarbone on left  side of chest with  arrow pointing upward.  Rub patch adhesive wings for 2 minutes. Remove white label marked 1. Remove the white  label marked 2. Rub patch adhesive wings for 2 additional minutes.  While looking in a mirror, press and release button in center of patch. A small green light will  flash 3-4 times. This will be your only indicator that the monitor has been turned on.  Do not shower for the first 24 hours. You may shower after the first 24 hours.  Press the button if you feel a symptom. You will hear a small click. Record Date, Time and  Symptom in the Patient Logbook.  When you are ready to remove the patch, follow instructions on the last 2 pages of Patient  Logbook. Stick patch monitor onto the last page of Patient Logbook.  Place Patient Logbook in the blue and white box. Use locking tab on box and tape box closed   securely. The blue and white box has prepaid postage on it. Please place it in the mailbox as  soon as possible. Your physician should have your test results approximately 7 days after the  monitor has been mailed back to [facility].  Call [facility] Customer Care at [phone] if you have questions regarding  your ZIO XT patch monitor. Call them immediately if you see an orange light blinking on your  monitor.  If your monitor falls off in less than 4 days, contact our Monitor department at [phone].  If your monitor becomes loose or falls off after 4 days call [employer] at [phone] for  suggestions on securing your monitor

## 2024-02-08 NOTE — Progress Notes (Unsigned)
 Enrolled for [employer] to mail a ZIO XT long term holter monitor to the patients address on file.

## 2024-02-21 DIAGNOSIS — X32XXXA Exposure to sunlight, initial encounter: Secondary | ICD-10-CM | POA: Diagnosis not present

## 2024-02-21 DIAGNOSIS — L538 Other specified erythematous conditions: Secondary | ICD-10-CM | POA: Diagnosis not present

## 2024-02-21 DIAGNOSIS — B078 Other viral warts: Secondary | ICD-10-CM | POA: Diagnosis not present

## 2024-02-21 DIAGNOSIS — L570 Actinic keratosis: Secondary | ICD-10-CM | POA: Diagnosis not present

## 2024-02-21 DIAGNOSIS — L0889 Other specified local infections of the skin and subcutaneous tissue: Secondary | ICD-10-CM | POA: Diagnosis not present

## 2024-02-21 DIAGNOSIS — Z789 Other specified health status: Secondary | ICD-10-CM | POA: Diagnosis not present

## 2024-02-28 ENCOUNTER — Inpatient Hospital Stay: Attending: Oncology | Admitting: Oncology

## 2024-02-28 ENCOUNTER — Encounter: Payer: Self-pay | Admitting: Oncology

## 2024-02-28 ENCOUNTER — Inpatient Hospital Stay

## 2024-02-28 DIAGNOSIS — Z809 Family history of malignant neoplasm, unspecified: Secondary | ICD-10-CM | POA: Insufficient documentation

## 2024-02-28 DIAGNOSIS — I480 Paroxysmal atrial fibrillation: Secondary | ICD-10-CM | POA: Insufficient documentation

## 2024-02-28 DIAGNOSIS — I10 Essential (primary) hypertension: Secondary | ICD-10-CM | POA: Diagnosis not present

## 2024-02-28 DIAGNOSIS — Z7901 Long term (current) use of anticoagulants: Secondary | ICD-10-CM | POA: Insufficient documentation

## 2024-02-28 DIAGNOSIS — Z87891 Personal history of nicotine dependence: Secondary | ICD-10-CM | POA: Insufficient documentation

## 2024-02-28 DIAGNOSIS — Z79899 Other long term (current) drug therapy: Secondary | ICD-10-CM | POA: Insufficient documentation

## 2024-02-28 LAB — IRON AND TIBC
Iron: 85 ug/dL (ref 45–182)
Saturation Ratios: 28 % (ref 17.9–39.5)
TIBC: 304 ug/dL (ref 250–450)
UIBC: 219 ug/dL

## 2024-02-28 LAB — CBC WITH DIFFERENTIAL (CANCER CENTER ONLY)
Abs Immature Granulocytes: 0 K/uL (ref 0.00–0.07)
Basophils Absolute: 0 K/uL (ref 0.0–0.1)
Basophils Relative: 1 %
Eosinophils Absolute: 0.2 K/uL (ref 0.0–0.5)
Eosinophils Relative: 4 %
HCT: 46.3 % (ref 39.0–52.0)
Hemoglobin: 16 g/dL (ref 13.0–17.0)
Immature Granulocytes: 0 %
Lymphocytes Relative: 15 %
Lymphs Abs: 0.8 K/uL (ref 0.7–4.0)
MCH: 33.7 pg (ref 26.0–34.0)
MCHC: 34.6 g/dL (ref 30.0–36.0)
MCV: 97.5 fL (ref 80.0–100.0)
Monocytes Absolute: 0.8 K/uL (ref 0.1–1.0)
Monocytes Relative: 14 %
Neutro Abs: 3.7 K/uL (ref 1.7–7.7)
Neutrophils Relative %: 66 %
Platelet Count: 148 K/uL — ABNORMAL LOW (ref 150–400)
RBC: 4.75 MIL/uL (ref 4.22–5.81)
RDW: 13.1 % (ref 11.5–15.5)
WBC Count: 5.6 K/uL (ref 4.0–10.5)
nRBC: 0 % (ref 0.0–0.2)

## 2024-02-28 LAB — CMP (CANCER CENTER ONLY)
ALT: 45 U/L — ABNORMAL HIGH (ref 0–44)
AST: 36 U/L (ref 15–41)
Albumin: 4.1 g/dL (ref 3.5–5.0)
Alkaline Phosphatase: 108 U/L (ref 38–126)
Anion gap: 10 (ref 5–15)
BUN: 20 mg/dL (ref 8–23)
CO2: 26 mmol/L (ref 22–32)
Calcium: 9.4 mg/dL (ref 8.9–10.3)
Chloride: 103 mmol/L (ref 98–111)
Creatinine: 0.68 mg/dL (ref 0.61–1.24)
GFR, Estimated: >60 mL/min (ref >60)
Glucose, Bld: 169 mg/dL — ABNORMAL HIGH (ref 70–99)
Potassium: 4.4 mmol/L (ref 3.5–5.1)
Sodium: 139 mmol/L (ref 135–145)
Total Bilirubin: 1 mg/dL (ref 0.0–1.2)
Total Protein: 7.1 g/dL (ref 6.5–8.1)

## 2024-02-28 LAB — FERRITIN: Ferritin: 147 ng/mL (ref 24–336)

## 2024-02-28 NOTE — Assessment & Plan Note (Signed)
-  History of diagnosis in the 1990s with subsequent phlebotomies. No recent phlebotomies.  He apparently even had liver biopsy.    -On his initial consultation with us  on 02/01/2023, hemochromatosis testing revealed heterozygosity for C282Y and H63D mutations.  No evidence of S65C mutation.  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  Ferritin goal will be adjusted depending on rest of the phenotype/liver numbers.  Goal is to maintain ferritin at least below 200 with therapeutic phlebotomies as needed.  -On 02/01/2023, LFTs were normal except for slightly elevated ALT of 48.  Iron saturation was 24%, ferritin was 108.  -Cardiac MRI on 01/19/2023 showed no evidence of iron overload in the heart  -Labs today revealed grossly unremarkable CBCD and CMP.  Ferritin 147.  Iron studies pending.  No indication for therapeutic phlebotomy currently.  He has not needed therapeutic phlebotomy since we started seeing him in our clinic in October 2024.  -I will see him in 6 months with repeat labs including iron studies, LFTs.  If ferritin is approaching 200 or if iron saturation is approaching 50%, then we will plan for therapeutic phlebotomies.

## 2024-02-28 NOTE — Progress Notes (Signed)
 [name]  HEMATOLOGY CLINIC PROGRESS NOTE  PATIENT NAME: Randy Evans   MR#: [id number] DOB: May 27, 1940  Patient Care Team: Randy Slater, MD as PCP - General (Family Medicine) Randy Evans ORN, MD as PCP - Electrophysiology (Cardiology) Randy Lonni CROME, MD as PCP - Cardiology (Cardiology) Randy Elsie RAMAN, MD as Consulting Physician (Cardiology) Randy Lonni CROME, MD as Consulting Physician (Cardiology)  Date of visit: 02/28/2024   ASSESSMENT & PLAN:   Randy Evans is an 83 y.o. gentleman with a past medical history of paroxysmal atrial fibrillation, ascending aortic aneurysm, hypertension, dyslipidemia, CAD, unspecified type of hemochromatosis, cerebral aneurysm status post coil embolization in [facility] in May 2006, was referred to our service in October 2024 for evaluation of hemochromatosis.  Found to have heterozygosity for C282Y and H63D mutations. Goal is to maintain ferritin at least below 200 with therapeutic phlebotomies as needed.  Heterozygosity for C282Y and H63D mutations, carrier of hereditary hemochromatosis -History of diagnosis in the 1990s with subsequent phlebotomies. No recent phlebotomies.  He apparently even had liver biopsy.    -On his initial consultation with us  on 02/01/2023, hemochromatosis testing revealed heterozygosity for C282Y and H63D mutations.  No evidence of S65C mutation.  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  Ferritin goal will be adjusted depending on rest of the phenotype/liver numbers.  Goal is to maintain ferritin at least below 200 with therapeutic phlebotomies as needed.  -On 02/01/2023, LFTs were normal except for slightly elevated ALT of 48.  Iron saturation was 24%, ferritin was 108.  -Cardiac MRI on 01/19/2023 showed no evidence of iron overload in the heart  -Labs today revealed grossly unremarkable CBCD and CMP.  Ferritin 147.  Iron studies  pending.  No indication for therapeutic phlebotomy currently.  He has not needed therapeutic phlebotomy since we started seeing him in our clinic in October 2024.  -I will see him in 6 months with repeat labs including iron studies, LFTs.  If ferritin is approaching 200 or if iron saturation is approaching 50%, then we will plan for therapeutic phlebotomies.   Atrial fibrillation (HCC) - Has had multiple cardioversions and ablations in the past.   - He recently had to wear a heart monitor for 2 weeks and has follow-up with Dr. Kate.  Also has cardiac PET scan scheduled next week. - On Eliquis  for anticoagulation. Regular heart rate noted on examination. - Continue Eliquis  as prescribed.    I spent a total of 32 minutes during this encounter with the patient including review of chart and various tests results, discussions about plan of care and coordination of care plan.  I reviewed lab results and outside records for this visit and discussed relevant results with the patient. Diagnosis, plan of care and treatment options were also discussed in detail with the patient. Opportunity provided to ask questions and answers provided to his apparent satisfaction. Provided instructions to call our clinic with any problems, questions or concerns prior to return visit. I recommended to continue follow-up with PCP and sub-specialists. He verbalized understanding and agreed with the plan. No barriers to learning was detected.  Randy Patten, MD  02/28/2024 11:07 AM  [facility] [address] [place] [address] Dept: [phone] Dept Fax: [phone]   CHIEF COMPLAINT/ REASON FOR VISIT:  Follow up for hereditary hemochromatosis, heterozygous for C282Y and H63D mutations. Goal is to  maintain ferritin at least below 200 with therapeutic phlebotomies as needed.  INTERVAL HISTORY:  Discussed the  use of AI scribe software for clinical note transcription with the patient, who gave verbal consent to proceed.  History of Present Illness  Randy Evans is an 83 year old male with heterozygous hemochromatosis who presents for a routine follow-up. He has concerns about his heart rhythm, having experienced symptoms that led to a consultation with Dr. Kate. He recently completed a two-week period of wearing a heart monitor, during which no significant events were recorded. He uses a new blood pressure cuff at home, which initially showed high readings but has since stabilized to more reasonable values both at home and in the office.  He has a history of hemochromatosis and recalls being told that only one gene is abnormal. His ferritin and iron levels have remained stable, with a current ferritin level of 147, slightly up from 138 in May. He recalls frequent phlebotomies when first diagnosed in [place], which have become less frequent as his levels stabilized.  He experiences discomfort high in his chest, which he can alleviate by rubbing the area. He is scheduled for a cardiac PET scan next week.  He mentions a family history of thyroid  issues, as his younger sister is currently being evaluated for thyroid  nodules, which are being biopsied. He expresses concern due to a family history of cancer.  He reports having a good appetite and maintaining his weight, joking that his appetite is 'too good.'    SUMMARY OF HEMATOLOGIC HISTORY:  83 y.o. gentleman with a past medical history of paroxysmal atrial fibrillation, ascending aortic aneurysm, hypertension, dyslipidemia, CAD, unspecified type of hemochromatosis, cerebral aneurysm status post coil embolization in [facility] in May 2006, was referred to our service in October 2024 for evaluation of hemochromatosis.     Patient was apparently diagnosed with hemochromatosis in the 30s when he was living in [place]. The patient was initially  managed with weekly phlebotomies, which gradually decreased in frequency.  Last phlebotomy was few years ago.  The patient also has a history of heart issues, including atrial fibrillation, for which he has had ablations and cardioversions. The patient reports that his heart rate has been regular recently.  He was recently seen by his cardiologist Dr. Kate who referred him to us  for his history of hemochromatosis.     The patient was diagnosed with celiac disease in 2023 and has been managing it with dietary modifications. The patient reports some issues with bowel movements, particularly when consuming a lot of greens and salads. The patient also reports some leg swelling, which he attributes to an increase in his amlodipine  dosage.   On his initial consultation with us  on 02/01/2023, hemochromatosis testing revealed heterozygosity for C282Y and H63D mutations.  No evidence of S65C mutation.  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  Ferritin goal will be adjusted depending on rest of the phenotype/liver numbers.  Goal is to maintain ferritin at least below 200 with therapeutic phlebotomies as needed.   On 02/01/2023, LFTs were normal except for slightly elevated ALT of 48.  Iron saturation was 24%, ferritin was 108.   Cardiac MRI on 01/19/2023 showed no evidence of iron overload in the heart.  Lately he has been needing therapeutic phlebotomies very infrequently.   I have reviewed the past medical history, past surgical history, social history and family history with the patient and they are unchanged from previous note.  ALLERGIES:  He is allergic to codeine, quinolones, and sulfa antibiotics.  MEDICATIONS:  Current Outpatient Medications  Medication Sig Dispense Refill   acetaminophen  (TYLENOL ) 500 MG tablet Take 1,000 mg by mouth every 6 (six) hours as needed. pain     allopurinol  (ZYLOPRIM ) 300 MG tablet Take 300 mg by mouth daily.        amLODipine  (NORVASC ) 10 MG tablet TAKE 1 TABLET DAILY 90 tablet 3   apixaban  (ELIQUIS ) 5 MG TABS tablet TAKE 1 TABLET TWICE A DAY 180 tablet 2   Ascorbic Acid (VITAMIN C PO) Take 1 tablet by mouth daily.     atorvastatin  (LIPITOR) 10 MG tablet TAKE 1 TABLET BY MOUTH EVERY DAY 90 tablet 2   carvedilol  (COREG ) 6.25 MG tablet Take 1 tablet (6.25 mg total) by mouth 2 (two) times daily. 180 tablet 3   chlorpheniramine (CHLOR-TRIMETON) 4 MG tablet Take 4 mg by mouth 2 (two) times daily as needed. allergies     clobetasol cream (TEMOVATE) 0.05 % Apply topically daily.     FARXIGA 5 MG TABS tablet Take 5 mg by mouth daily.     ketoconazole  (NIZORAL ) 2 % cream Apply topically daily.     multivitamin-lutein  (OCUVITE-LUTEIN ) CAPS Take 1 capsule by mouth daily.       Omega-3 Fatty Acids (FISH OIL PO) Take 1 Capful by mouth daily.     valsartan (DIOVAN) 320 MG tablet Take 320 mg by mouth daily.     VITAMIN D , ERGOCALCIFEROL , PO Take 1 Capful by mouth daily.     No current facility-administered medications for this visit.     REVIEW OF SYSTEMS:    ROS  All other pertinent systems were reviewed with the patient and are negative.  PHYSICAL EXAMINATION:   Onc Performance Status - 02/28/24 0906       ECOG Perf Status   ECOG Perf Status Restricted in physically strenuous activity but ambulatory and able to carry out work of a light or sedentary nature, e.g., light house work, office work      KPS SCALE   KPS % SCORE Normal activity with effort, some s/s of disease           Vitals:   02/28/24 0851  BP: 130/74  Pulse: 69  Resp: 18  Temp: 97.9 F (36.6 C)  SpO2: 97%   Filed Weights   02/28/24 0851  Weight: 193 lb 6.4 oz (87.7 kg)    Physical Exam Constitutional:      General: He is not in acute distress.    Appearance: Normal appearance.  HENT:     Head: Normocephalic and atraumatic.  Cardiovascular:     Rate and Rhythm: Normal rate and regular rhythm.     Heart sounds: Normal  heart sounds.  Pulmonary:     Effort: Pulmonary effort is normal. No respiratory distress.  Abdominal:     General: There is no distension.  Neurological:     General: No focal deficit present.     Mental Status: He is alert and oriented to person, place, and time.  Psychiatric:        Mood and Affect: Mood normal.        Behavior: Behavior normal.     LABORATORY DATA:   I have reviewed the data as listed.  Results for orders placed or performed in visit on 02/28/24  Ferritin  Result Value Ref Range   Ferritin 147 24 - 336 ng/mL  CMP (Cancer Center only)  Result Value Ref  Range   Sodium 139 135 - 145 mmol/L   Potassium 4.4 3.5 - 5.1 mmol/L   Chloride 103 98 - 111 mmol/L   CO2 26 22 - 32 mmol/L   Glucose, Bld 169 (H) 70 - 99 mg/dL   BUN 20 8 - 23 mg/dL   Creatinine 9.31 9.38 - 1.24 mg/dL   Calcium  9.4 8.9 - 10.3 mg/dL   Total Protein 7.1 6.5 - 8.1 g/dL   Albumin 4.1 3.5 - 5.0 g/dL   AST 36 15 - 41 U/L   ALT 45 (H) 0 - 44 U/L   Alkaline Phosphatase 108 38 - 126 U/L   Total Bilirubin 1.0 0.0 - 1.2 mg/dL   GFR, Estimated >39 >39 mL/min   Anion gap 10 5 - 15  CBC with Differential (Cancer Center Only)  Result Value Ref Range   WBC Count 5.6 4.0 - 10.5 K/uL   RBC 4.75 4.22 - 5.81 MIL/uL   Hemoglobin 16.0 13.0 - 17.0 g/dL   HCT 53.6 60.9 - 47.9 %   MCV 97.5 80.0 - 100.0 fL   MCH 33.7 26.0 - 34.0 pg   MCHC 34.6 30.0 - 36.0 g/dL   RDW 86.8 88.4 - 84.4 %   Platelet Count 148 (L) 150 - 400 K/uL   nRBC 0.0 0.0 - 0.2 %   Neutrophils Relative % 66 %   Neutro Abs 3.7 1.7 - 7.7 K/uL   Lymphocytes Relative 15 %   Lymphs Abs 0.8 0.7 - 4.0 K/uL   Monocytes Relative 14 %   Monocytes Absolute 0.8 0.1 - 1.0 K/uL   Eosinophils Relative 4 %   Eosinophils Absolute 0.2 0.0 - 0.5 K/uL   Basophils Relative 1 %   Basophils Absolute 0.0 0.0 - 0.1 K/uL   Immature Granulocytes 0 %   Abs Immature Granulocytes 0.00 0.00 - 0.07 K/uL    RADIOGRAPHIC STUDIES:  No recent pertinent  imaging studies available to review.  Orders Placed This Encounter  Procedures   CBC with Differential (Cancer Center Only)    Standing Status:   Future    Expected Date:   08/27/2024    Expiration Date:   11/25/2024   CMP (Cancer Center only)    Standing Status:   Future    Expected Date:   08/27/2024    Expiration Date:   11/25/2024   Iron and TIBC    Standing Status:   Future    Expected Date:   08/27/2024    Expiration Date:   11/25/2024   Ferritin    Standing Status:   Future    Expected Date:   08/27/2024    Expiration Date:   11/25/2024     Future Appointments  Date Time Provider Department Center  03/06/2024  7:20 AM [facility] PET CT 1 [facility]   05/09/2024  8:40 AM Randy Lonni CROME, MD [facility] H&V  08/20/2024  1:00 PM [facility] PHLEBOTOMIST [facility] None  08/21/2024  8:30 AM Romar Woodrick, [place], MD [facility] None    This document was completed utilizing speech recognition software. Grammatical errors, random word insertions, pronoun errors, and incomplete sentences are an occasional consequence of this system due to software limitations, ambient noise, and hardware issues. Any formal questions or concerns about the content, text or information contained within the body of this dictation should be directly addressed to the provider for clarification.

## 2024-02-28 NOTE — Assessment & Plan Note (Signed)
-   Has had multiple cardioversions and ablations in the past.   - He recently had to wear a heart monitor for 2 weeks and has follow-up with Dr. Kate.  Also has cardiac PET scan scheduled next week. - On Eliquis  for anticoagulation. Regular heart rate noted on examination. - Continue Eliquis  as prescribed.

## 2024-02-29 ENCOUNTER — Encounter (HOSPITAL_COMMUNITY): Payer: Self-pay

## 2024-03-05 ENCOUNTER — Telehealth (HOSPITAL_COMMUNITY): Payer: Self-pay | Admitting: *Deleted

## 2024-03-05 NOTE — Telephone Encounter (Signed)
Attempted to call patient regarding upcoming cardiac PET appointment. Left message on voicemail with name and callback number  Monya Kozakiewicz RN Navigator Cardiac Imaging [facility] [phone] Office [phone] Cell  Reminder to avoid caffeine 12 hours prior to appt. 

## 2024-03-06 ENCOUNTER — Ambulatory Visit: Payer: Self-pay | Admitting: Cardiology

## 2024-03-06 ENCOUNTER — Encounter (HOSPITAL_COMMUNITY)
Admission: RE | Admit: 2024-03-06 | Discharge: 2024-03-06 | Disposition: A | Source: Ambulatory Visit | Attending: Cardiology

## 2024-03-06 DIAGNOSIS — I4891 Unspecified atrial fibrillation: Secondary | ICD-10-CM | POA: Diagnosis not present

## 2024-03-06 DIAGNOSIS — R072 Precordial pain: Secondary | ICD-10-CM | POA: Insufficient documentation

## 2024-03-06 DIAGNOSIS — I77810 Thoracic aortic ectasia: Secondary | ICD-10-CM | POA: Diagnosis not present

## 2024-03-06 DIAGNOSIS — I2510 Atherosclerotic heart disease of native coronary artery without angina pectoris: Secondary | ICD-10-CM | POA: Insufficient documentation

## 2024-03-06 DIAGNOSIS — J929 Pleural plaque without asbestos: Secondary | ICD-10-CM | POA: Diagnosis not present

## 2024-03-06 DIAGNOSIS — R079 Chest pain, unspecified: Secondary | ICD-10-CM | POA: Diagnosis not present

## 2024-03-06 LAB — NM PET CT CARDIAC PERFUSION MULTI W/ABSOLUTE BLOODFLOW
LV dias vol: 139 mL (ref 62–150)
MBFR: 1.81
Nuc Rest EF: 50 %
Nuc Stress EF: 51 %
Peak HR: 70 {beats}/min
Rest HR: 59 {beats}/min
Rest MBF: 0.52 ml/g/min
Rest Nuclear Isotope Dose: 22.5 mCi
Rest perfusion cavity size (mL): 139 mL
ST Depression (mm): 0 mm
Stress MBF: 0.94 ml/g/min
Stress Nuclear Isotope Dose: 22.7 mCi
Stress perfusion cavity size (mL): 160 mL
TID: 1.28

## 2024-03-06 MED ORDER — RUBIDIUM RB82 GENERATOR (RUBYFILL)
22.5000 | PACK | Freq: Once | INTRAVENOUS | Status: AC
  Administered 2024-03-06: 22.5 via INTRAVENOUS

## 2024-03-06 MED ORDER — REGADENOSON 0.4 MG/5ML IV SOLN
INTRAVENOUS | Status: AC
  Filled 2024-03-06: qty 5

## 2024-03-06 MED ORDER — REGADENOSON 0.4 MG/5ML IV SOLN
0.4000 mg | Freq: Once | INTRAVENOUS | Status: AC
  Administered 2024-03-06: 0.4 mg via INTRAVENOUS

## 2024-03-06 MED ORDER — RUBIDIUM RB82 GENERATOR (RUBYFILL)
22.7000 | PACK | Freq: Once | INTRAVENOUS | Status: AC
  Administered 2024-03-06: 22.7 via INTRAVENOUS

## 2024-03-08 NOTE — Progress Notes (Signed)
 Cardiology Office Note:    Date:  03/11/2024   ID:  Evans, Randy January 18, 1941, MRN [id number]  PCP:  Regino Slater, MD  Cardiologist:  Lonni LITTIE Nanas, MD  Electrophysiologist:  Danelle Birmingham, MD   Referring MD: Regino Slater, MD   Chief Complaint  Patient presents with   Coronary Artery Disease    History of Present Illness:    Randy Evans is a 83 y.o. male with a hx of atrial fibrillation/flutter, ascending aortic aneurysm, cerebral aneurysm status post coil embolization, hemochromatosis, gout who presents for follow-up.  Previously followed with Dr. [name].  Cardiac MRI 01/2023 showed LVEF 56%, no evidence of myocardial iron overload, LGE consistent with prior infarct in basal anterolateral and basal to mid inferolateral wall.  Stress PET 03/06/24 showed LAD ischemia, TID, reduced myocardial blood flow reserve, high risk study.  Zio patch x 2 weeks 02/2024 showed 1 episode of NSVT lasting 5 beats, 39 episodes of SVT longest lasting 14 seconds.  Since last clinic visit, he reports he is continuing to have chest pain.  Describes pain in upper chest that is intermittent.  Lasts for short duration.  Denies any dyspnea.  Denies any lightheadedness or syncope.   BP Readings from Last 3 Encounters:  03/09/24 130/72  03/06/24 100/69  02/28/24 130/74     Wt Readings from Last 3 Encounters:  03/09/24 194 lb 6.4 oz (88.2 kg)  02/28/24 193 lb 6.4 oz (87.7 kg)  02/08/24 192 lb 11.2 oz (87.4 kg)     Past Medical History:  Diagnosis Date   Atrial fibrillation (HCC)    Onset 1991 in [place]    Cerebral aneurysm without rupture    treated with coil embolization 2006   Gout    Hemochromatosis    Has seen Norleen Hint   Hypertension     Past Surgical History:  Procedure Laterality Date   ANEURYSM COILING  2006   bone spur removal  1951   below left knee   BRAIN SURGERY     CARDIAC ELECTROPHYSIOLOGY MAPPING AND ABLATION  01/2008; 07/2008   CARDIOVERSION  2000;  2008 03/2008; ;02/15/2011   2008; 2009 2012:    Surgeon: LELON Jacques Blanca Mickey., MD;  Location: [facility] OR;  Service: Cardiovascular;  Laterality: N/A;   CARDIOVERSION  08/05/2011   Procedure: CARDIOVERSION;  Surgeon: Elsie GORMAN Blanca, MD;  Location: [facility] OR;  Service: Cardiovascular;  Laterality: N/A;   CATARACT EXTRACTION W/ INTRAOCULAR LENS  IMPLANT, BILATERAL  2009-2010   IR ANGIO INTRA EXTRACRAN SEL COM CAROTID INNOMINATE UNI L MOD SED  11/09/2016   IR ANGIO INTRA EXTRACRAN SEL COM CAROTID INNOMINATE UNI R MOD SED  07/17/2019   IR ANGIO INTRA EXTRACRAN SEL INTERNAL CAROTID UNI R MOD SED  11/09/2016   IR US  GUIDE VASC ACCESS RIGHT  07/17/2019   LIVER BIOPSY  late 1990's   TOE SURGERY      Current Medications: Current Meds  Medication Sig   acetaminophen  (TYLENOL ) 500 MG tablet Take 1,000 mg by mouth every 6 (six) hours as needed. pain   allopurinol  (ZYLOPRIM ) 300 MG tablet Take 300 mg by mouth daily.     amLODipine  (NORVASC ) 10 MG tablet TAKE 1 TABLET DAILY   apixaban  (ELIQUIS ) 5 MG TABS tablet TAKE 1 TABLET TWICE A DAY   Ascorbic Acid (VITAMIN C PO) Take 1 tablet by mouth daily.   atorvastatin  (LIPITOR) 10 MG tablet TAKE 1 TABLET BY MOUTH EVERY DAY   carvedilol  (COREG ) 6.25 MG tablet  Take 1 tablet (6.25 mg total) by mouth 2 (two) times daily.   chlorpheniramine (CHLOR-TRIMETON) 4 MG tablet Take 4 mg by mouth 2 (two) times daily as needed. allergies   clobetasol cream (TEMOVATE) 0.05 % Apply topically daily.   FARXIGA 5 MG TABS tablet Take 5 mg by mouth daily.   ketoconazole  (NIZORAL ) 2 % cream Apply topically daily.   multivitamin-lutein  (OCUVITE-LUTEIN ) CAPS Take 1 capsule by mouth daily.     nitroGLYCERIN  (NITROSTAT ) 0.4 MG SL tablet Place 1 tablet (0.4 mg total) under the tongue every 5 (five) minutes as needed for chest pain.   Omega-3 Fatty Acids (FISH OIL PO) Take 1 Capful by mouth daily.   valsartan (DIOVAN) 320 MG tablet Take 320 mg by mouth daily.   VITAMIN D , ERGOCALCIFEROL , PO Take 1  Capful by mouth daily.     Allergies:   Codeine, Quinolones, and Sulfa antibiotics   Social History   Socioeconomic History   Marital status: Married    Spouse name: Not on file   Number of children: Not on file   Years of education: Not on file   Highest education level: Not on file  Occupational History   Not on file  Tobacco Use   Smoking status: Former    Types: Cigarettes, Pipe, Cigars   Smokeless tobacco: Never   Tobacco comments:    quit smoking 1966  Substance and Sexual Activity   Alcohol use: Yes    Alcohol/week: 2.0 standard drinks of alcohol    Types: 2 Glasses of wine per week    Comment: SOCIAL   Drug use: No   Sexual activity: Yes    Partners: Female    Comment: MARRIED  Other Topics Concern   Not on file  Social History Narrative   Retired from [employer].  Moved from [place] . Graduate of [employer].  Married.   Social Drivers of Corporate Investment Banker Strain: Not on file  Food Insecurity: No Food Insecurity (02/01/2023)   Hunger Vital Sign    Worried About Running Out of Food in the Last Year: Never true    Ran Out of Food in the Last Year: Never true  Transportation Needs: No Transportation Needs (02/01/2023)   PRAPARE - Administrator, Civil Service (Medical): No    Lack of Transportation (Non-Medical): No  Physical Activity: Not on file  Stress: Not on file  Social Connections: Not on file     Family History: The patient's family history is not on file.  ROS:   Please see the history of present illness.     All other systems reviewed and are negative.  EKGs/Labs/Other Studies Reviewed:    The following studies were reviewed today:   EKG:   01/05/2023: Sinus rhythm with first-degree AV block, rate 60, right bundle branch block, left anterior fascicular block 12/13/2023: Sinus bradycardia with first-degree AV block, PVC, right bundle branch block, left anterior fascicular block, rate 59 03/09/2024: Sinus rhythm with first-degree  AV block, rate 62, right bundle branch block, first-degree AV block, left finger fascicular block  Recent Labs: 02/28/2024: ALT 45 03/09/2024: BUN 18; Creatinine, Ser 0.78; Hemoglobin 17.5; Platelets 179; Potassium 4.7; Sodium 138; TSH 1.380  Recent Lipid Panel No results found for: CHOL, TRIG, HDL, CHOLHDL, VLDL, LDLCALC, LDLDIRECT  Physical Exam:    VS:  BP 130/72   Pulse 62   Ht 5' 9 (1.753 m)   Wt 194 lb 6.4 oz (88.2 kg)   SpO2 95%  BMI 28.71 kg/m     Wt Readings from Last 3 Encounters:  03/09/24 194 lb 6.4 oz (88.2 kg)  02/28/24 193 lb 6.4 oz (87.7 kg)  02/08/24 192 lb 11.2 oz (87.4 kg)     GEN:  Well nourished, well developed in no acute distress HEENT: Normal NECK: No JVD; No carotid bruits CARDIAC: RRR, no murmurs, rubs, gallops RESPIRATORY:  Clear to auscultation without rales, wheezing or rhonchi  ABDOMEN: Soft, non-tender, non-distended MUSCULOSKELETAL: Trace edema SKIN: Warm and dry NEUROLOGIC:  Alert and oriented x 3 PSYCHIATRIC:  Normal affect   ASSESSMENT:    1. CAD in native artery   2. Atrial fibrillation, unspecified type (HCC)   3. Aneurysm of ascending aorta without rupture   4. Essential hypertension   5. Medication management   6. Pre-procedure lab exam       PLAN:    CAD: Cardiac MRI 08/2019 showed subendocardial LGE consistent with prior infarct in basal to mid inferolateral wall and basal anterolateral wall.  Overall EF 66%.  Reported chest pain and underwent stress PET 03/06/24 which showed LAD ischemia, TID, reduced myocardial blood flow reserve, high risk study. -Continue Eliquis  5 mg twice daily -Continue atorvastatin  10 mg daily -As needed sublingual nitroglycerin  -Recommend LHC. Risks and benefits of cardiac catheterization have been discussed with the patient.  These include bleeding, infection, kidney damage, stroke, heart attack, death.  The patient understands these risks and is willing to proceed.  Paroxysmal  atrial fibrillation: Follows with EP.  Status post multiple ablations.  Previously has been on Tikosyn  but off antiarrhythmic therapy.  Echocardiogram 04/2018 showed EF 50 to 55%, grade 2 diastolic dysfunction, ascending aortic dilatation measuring 43 mm.  Appears to be maintaining sinus rhythm since his last ablation in 06/2018.  Zio patch x 2 weeks 02/2024 showed 1 episode of NSVT lasting 5 beats, 39 episodes of SVT longest lasting 14 seconds. -Continue Eliquis  5 mg twice daily -Continue carvedilol   Aortic aneurysm: Measured 43 mm in ascending aorta on echo 04/2019.  CTA chest 10/2021 showed ascending aortic aneurysm measuring 46 mm.  MRI chest 01/2023 showed ascending aorta measured 42 mm, sinus of Valsalva measured 43 mm.  He follows with Dr. Lucas, CTA chest 12/2023 showed ascending aortic dilatation measuring 42 mm  Hypertension: On amlodipine  10 mg daily and carvedilol  6.25 mg twice daily and valsartan 320 mg daily.  Appears controlled  Hyperlipidemia: On atorvastatin  10 mg daily.  LDL 60 on 06/22/2023  Hemochromatosis: previously required weekly phlebotomy now only receiving rarely.  Reports diagnosis was confirmed with genetic testing in [place].   -Referred to hematology -Cardiac MRI 01/2023 showed no evidence of myocardial iron overload  Lower extremity edema: Suspect due to amlodipine  use.  Normal BNP, albumin 01/2023.  Has improved  RTC in 1 month     Medication Adjustments/Labs and Tests Ordered: Current medicines are reviewed at length with the patient today.  Concerns regarding medicines are outlined above.  Orders Placed This Encounter  Procedures   Basic Metabolic Panel (BMET)   CBC   TSH   EKG 12-Lead   Meds ordered this encounter  Medications   nitroGLYCERIN  (NITROSTAT ) 0.4 MG SL tablet    Sig: Place 1 tablet (0.4 mg total) under the tongue every 5 (five) minutes as needed for chest pain.    Dispense:  25 tablet    Refill:  1    Patient Instructions  Medication  Instructions:  Your physician has recommended you make the following change in  your medication: Take Nitroglycerin  as needed, please review medication information below located under other instructions.  *If you need a refill on your cardiac medications before your next appointment, please call your pharmacy*  Lab Work: Today: BMET, CBC & Magnesium   If you have any lab test that is abnormal or we need to change your treatment, we will call you to review the results.  Testing/Procedures: Your physician has requested that you have a cardiac catheterization. Cardiac catheterization is used to diagnose and/or treat various heart conditions. Doctors may recommend this procedure for a number of different reasons. The most common reason is to evaluate chest pain. Chest pain can be a symptom of coronary artery disease (CAD), and cardiac catheterization can show whether plaque is narrowing or blocking your heart's arteries. This procedure is also used to evaluate the valves, as well as measure the blood flow and oxygen levels in different parts of your heart. For further information please visit [email or web address] Please follow instruction sheet below under other instructions.   Follow-Up: At [facility], you and your health needs are our priority.  As part of our continuing mission to provide you with exceptional heart care, our providers are all part of one team.  This team includes your primary Cardiologist (physician) and Advanced Practice Providers or APPs (Physician Assistants and Nurse Practitioners) who all work together to provide you with the care you need, when you need it.  Your next appointment:   Janurary  5th  Provider:   Lonni LITTIE Nanas, MD     Thank you for choosing [facility]!!   [phone]   Other Instructions   [place] [facility] A DEPT OF [facility]ARTC[facility] AT [address] A DEPT OF THE [facility] [address] [place] [address] Dept: [phone] Loc: [phone]  DERONTE SOLIS  03/09/2024  You are scheduled for a Cardiac Catheterization on Tuesday, December 16 with Dr. Newman Lawrence.  1. Please arrive at the [facility] (Main Entrance A) at [facility]: [address] [place], [place] [address] at 5:30 AM (This time is 2 hour(s) before your procedure to ensure your preparation).   Free valet parking service is available. You will check in at ADMITTING. The support person will be asked to wait in the waiting room.  It is OK to have someone drop you off and come back when you are ready to be discharged.    Special note: Every effort is made to have your procedure done on time. Please understand that emergencies sometimes delay scheduled procedures.  2. Diet: Nothing to eat after midnight.   3. Hydration: You need to be well hydrated before your procedure. On December 16, you may drink approved liquids (see below) until 2 hours before the procedure, with 16 oz of water as your last intake.   List of approved liquids water, clear juice, clear tea, black coffee, fruit juices, non-citric and without pulp, carbonated beverages, Gatorade, Kool -Aid, plain Jello-O and plain ice popsicles.  4. Labs: You will need to have blood drawn on , December 5 at [facility] (1st Floor), [address] [place], [place] [address]. You do not need to be fasting.  5. Medication instructions in preparation for your procedure:   Contrast Allergy: No  Stop taking Farxiga 3 days prior, take your last dose on Friday December 12  Stop taking Eliquis  (Apixiban) on  Saturday, December 13.  Stop taking, Diovan (Valsartan) Sunday, December 14,   On the morning of your procedure, take your Aspirin 81 mg and any morning medicines NOT listed above.  You may use sips of water.  6. Plan to go home the same day, you will only stay overnight if  medically necessary. 7. Bring a current list of your medications and current insurance cards. 8. You MUST have a responsible person to drive you home. 9. Someone MUST be with you the first 24 hours after you arrive home or your discharge will be delayed. 10. Please wear clothes that are easy to get on and off and wear slip-on shoes.  Thank you for allowing us  to care for you!   -- [facility]      Signed, Lonni LITTIE Nanas, MD  03/11/2024 2:42 PM    [facility]

## 2024-03-09 ENCOUNTER — Ambulatory Visit: Attending: Cardiology | Admitting: Cardiology

## 2024-03-09 ENCOUNTER — Encounter: Payer: Self-pay | Admitting: Cardiology

## 2024-03-09 VITALS — BP 130/72 | HR 62 | Ht 69.0 in | Wt 194.4 lb

## 2024-03-09 DIAGNOSIS — I10 Essential (primary) hypertension: Secondary | ICD-10-CM | POA: Diagnosis not present

## 2024-03-09 DIAGNOSIS — I7121 Aneurysm of the ascending aorta, without rupture: Secondary | ICD-10-CM | POA: Diagnosis not present

## 2024-03-09 DIAGNOSIS — I2510 Atherosclerotic heart disease of native coronary artery without angina pectoris: Secondary | ICD-10-CM | POA: Diagnosis not present

## 2024-03-09 DIAGNOSIS — Z79899 Other long term (current) drug therapy: Secondary | ICD-10-CM

## 2024-03-09 DIAGNOSIS — I4891 Unspecified atrial fibrillation: Secondary | ICD-10-CM

## 2024-03-09 DIAGNOSIS — Z01812 Encounter for preprocedural laboratory examination: Secondary | ICD-10-CM | POA: Diagnosis not present

## 2024-03-09 MED ORDER — NITROGLYCERIN 0.4 MG SL SUBL: 0.4000 mg | SUBLINGUAL_TABLET | SUBLINGUAL | 1 refills | Status: DC | PRN

## 2024-03-09 NOTE — Patient Instructions (Addendum)
 Medication Instructions:  Your physician has recommended you make the following change in your medication: Take Nitroglycerin  as needed, please review medication information below located under other instructions.  *If you need a refill on your cardiac medications before your next appointment, please call your pharmacy*  Lab Work: Today: BMET, CBC & Magnesium   If you have any lab test that is abnormal or we need to change your treatment, we will call you to review the results.  Testing/Procedures: Your physician has requested that you have a cardiac catheterization. Cardiac catheterization is used to diagnose and/or treat various heart conditions. Doctors may recommend this procedure for a number of different reasons. The most common reason is to evaluate chest pain. Chest pain can be a symptom of coronary artery disease (CAD), and cardiac catheterization can show whether plaque is narrowing or blocking your heart's arteries. This procedure is also used to evaluate the valves, as well as measure the blood flow and oxygen levels in different parts of your heart. For further information please visit [email or web address] Please follow instruction sheet below under other instructions.   Follow-Up: At [facility], you and your health needs are our priority.  As part of our continuing mission to provide you with exceptional heart care, our providers are all part of one team.  This team includes your primary Cardiologist (physician) and Advanced Practice Providers or APPs (Physician Assistants and Nurse Practitioners) who all work together to provide you with the care you need, when you need it.  Your next appointment:   Janurary  5th  Provider:   Lonni LITTIE Nanas, MD     Thank you for choosing [facility]!!   [phone]   Other Instructions   [place] [facility] A DEPT OF [facility] HEARTCARE AT [address] A DEPT OF THE [place] [address] [place] [address] Dept: [phone] Loc: [phone]  BRAEDAN MEUTH  03/09/2024  You are scheduled for a Cardiac Catheterization on Tuesday, December 16 with Dr. Newman Lawrence.  1. Please arrive at the [facility] (Main Entrance A) at [facility]: [address] [place], [place] [address] at 5:30 AM (This time is 2 hour(s) before your procedure to ensure your preparation).   Free valet parking service is available. You will check in at ADMITTING. The support person will be asked to wait in the waiting room.  It is OK to have someone drop you off and come back when you are ready to be discharged.    Special note: Every effort is made to have your procedure done on time. Please understand that emergencies sometimes delay scheduled procedures.  2. Diet: Nothing to eat after midnight.   3. Hydration: You need to be well hydrated before your procedure. On December 16, you may drink approved liquids (see below) until 2 hours before the procedure, with 16 oz of water as your last intake.   List of approved liquids water, clear juice, clear tea, black coffee, fruit juices, non-citric and without pulp, carbonated beverages, Gatorade, Kool -Aid, plain Jello-O and plain ice popsicles.  4. Labs: You will need to have blood drawn on , December 5 at [facility] (1st Floor), [address], [place], [place] [address]. You do not need to be fasting.  5. Medication instructions in preparation for your procedure:   Contrast Allergy: No  Stop taking Farxiga 3 days prior, take  your last dose on Friday December 12  Stop taking Eliquis  (Apixiban) on Saturday, December 13.  Stop taking, Diovan (Valsartan) Sunday, December 14,   On the morning of your procedure, take your Aspirin 81 mg and any morning medicines NOT listed above.  You may use sips of water.  6. Plan to go home the same day, you will only stay  overnight if medically necessary. 7. Bring a current list of your medications and current insurance cards. 8. You MUST have a responsible person to drive you home. 9. Someone MUST be with you the first 24 hours after you arrive home or your discharge will be delayed. 10. Please wear clothes that are easy to get on and off and wear slip-on shoes.  Thank you for allowing us  to care for you!   -- [facility]

## 2024-03-10 LAB — BASIC METABOLIC PANEL WITH GFR
BUN/Creatinine Ratio: 23 (ref 10–24)
BUN: 18 mg/dL (ref 8–27)
CO2: 23 mmol/L (ref 20–29)
Calcium: 9.6 mg/dL (ref 8.6–10.2)
Chloride: 99 mmol/L (ref 96–106)
Creatinine, Ser: 0.78 mg/dL (ref 0.76–1.27)
Glucose: 137 mg/dL — ABNORMAL HIGH (ref 70–99)
Potassium: 4.7 mmol/L (ref 3.5–5.2)
Sodium: 138 mmol/L (ref 134–144)
eGFR: 90 mL/min/1.73 (ref >59)

## 2024-03-10 LAB — CBC
Hematocrit: 52.2 % — ABNORMAL HIGH (ref 37.5–51.0)
Hemoglobin: 17.5 g/dL (ref 13.0–17.7)
MCH: 34 pg — ABNORMAL HIGH (ref 26.6–33.0)
MCHC: 33.5 g/dL (ref 31.5–35.7)
MCV: 102 fL — ABNORMAL HIGH (ref 79–97)
Platelets: 179 x10E3/uL (ref 150–450)
RBC: 5.14 x10E6/uL (ref 4.14–5.80)
RDW: 13.2 % (ref 11.6–15.4)
WBC: 7.3 x10E3/uL (ref 3.4–10.8)

## 2024-03-10 LAB — TSH: TSH: 1.38 u[IU]/mL (ref 0.450–4.500)

## 2024-03-11 ENCOUNTER — Ambulatory Visit: Payer: Self-pay | Admitting: Cardiology

## 2024-03-19 ENCOUNTER — Telehealth: Payer: Self-pay | Admitting: *Deleted

## 2024-03-19 DIAGNOSIS — L538 Other specified erythematous conditions: Secondary | ICD-10-CM | POA: Diagnosis not present

## 2024-03-19 DIAGNOSIS — L0889 Other specified local infections of the skin and subcutaneous tissue: Secondary | ICD-10-CM | POA: Diagnosis not present

## 2024-03-19 DIAGNOSIS — Z789 Other specified health status: Secondary | ICD-10-CM | POA: Diagnosis not present

## 2024-03-19 DIAGNOSIS — B078 Other viral warts: Secondary | ICD-10-CM | POA: Diagnosis not present

## 2024-03-19 NOTE — Telephone Encounter (Addendum)
 Cardiac Catheterization scheduled at [facility] for: Tuesday March 20, 2024 7:30 AM Arrival time [facility] Main Entrance A at: 5:30 AM  Diet: -Nothing to eat after midnight.  Hydration: -May drink clear liquids until 2 hours before the procedure.  Approved liquids: Water , clear tea, black coffee, fruit juices-non-citric and without pulp,Gatorade, plain Jello/popsicles.   -Please drink 16 oz of water  2 hours before procedure.  Medication instructions: -Hold:  Eliquis -none 03/18/24 until post procedure  Farxiga-AM of procedure -Other usual morning medications can be taken including aspirin  81 mg.  Plan to go home the same day, you will only stay overnight if medically necessary.  You must have responsible adult to drive you home.  Someone must be with you the first 24 hours after you arrive home.  Reviewed procedure instructions with patient.

## 2024-03-20 ENCOUNTER — Ambulatory Visit (HOSPITAL_BASED_OUTPATIENT_CLINIC_OR_DEPARTMENT_OTHER)
Admission: RE | Admit: 2024-03-20 | Discharge: 2024-03-20 | Disposition: A | Discharge status: Home / Self Care | Source: Home / Self Care | Attending: Cardiology | Admitting: Cardiology

## 2024-03-20 ENCOUNTER — Other Ambulatory Visit: Payer: Self-pay

## 2024-03-20 ENCOUNTER — Encounter (HOSPITAL_COMMUNITY): Payer: Self-pay | Admitting: Cardiology

## 2024-03-20 ENCOUNTER — Other Ambulatory Visit: Payer: Self-pay | Admitting: Cardiology

## 2024-03-20 ENCOUNTER — Encounter (HOSPITAL_COMMUNITY): Admission: RE | Disposition: A | Payer: Self-pay | Attending: Cardiology

## 2024-03-20 DIAGNOSIS — Z79899 Other long term (current) drug therapy: Secondary | ICD-10-CM | POA: Insufficient documentation

## 2024-03-20 DIAGNOSIS — E785 Hyperlipidemia, unspecified: Secondary | ICD-10-CM | POA: Insufficient documentation

## 2024-03-20 DIAGNOSIS — I119 Hypertensive heart disease without heart failure: Secondary | ICD-10-CM | POA: Insufficient documentation

## 2024-03-20 DIAGNOSIS — I7121 Aneurysm of the ascending aorta, without rupture: Secondary | ICD-10-CM | POA: Insufficient documentation

## 2024-03-20 DIAGNOSIS — Z7901 Long term (current) use of anticoagulants: Secondary | ICD-10-CM | POA: Insufficient documentation

## 2024-03-20 DIAGNOSIS — I25118 Atherosclerotic heart disease of native coronary artery with other forms of angina pectoris: Secondary | ICD-10-CM

## 2024-03-20 DIAGNOSIS — I671 Cerebral aneurysm, nonruptured: Secondary | ICD-10-CM | POA: Insufficient documentation

## 2024-03-20 DIAGNOSIS — M109 Gout, unspecified: Secondary | ICD-10-CM | POA: Insufficient documentation

## 2024-03-20 DIAGNOSIS — Z87891 Personal history of nicotine dependence: Secondary | ICD-10-CM | POA: Insufficient documentation

## 2024-03-20 DIAGNOSIS — Z7982 Long term (current) use of aspirin: Secondary | ICD-10-CM | POA: Insufficient documentation

## 2024-03-20 DIAGNOSIS — I2510 Atherosclerotic heart disease of native coronary artery without angina pectoris: Secondary | ICD-10-CM

## 2024-03-20 DIAGNOSIS — R600 Localized edema: Secondary | ICD-10-CM | POA: Insufficient documentation

## 2024-03-20 DIAGNOSIS — R079 Chest pain, unspecified: Secondary | ICD-10-CM

## 2024-03-20 DIAGNOSIS — I2584 Coronary atherosclerosis due to calcified coronary lesion: Secondary | ICD-10-CM | POA: Insufficient documentation

## 2024-03-20 DIAGNOSIS — I480 Paroxysmal atrial fibrillation: Secondary | ICD-10-CM | POA: Insufficient documentation

## 2024-03-20 DIAGNOSIS — Z01810 Encounter for preprocedural cardiovascular examination: Secondary | ICD-10-CM | POA: Diagnosis not present

## 2024-03-20 HISTORY — PX: LEFT HEART CATH AND CORONARY ANGIOGRAPHY: CATH118249

## 2024-03-20 LAB — ECHOCARDIOGRAM COMPLETE
Area-P 1/2: 3.01 cm2
Height: 69 in
P 1/2 time: 665 ms
S' Lateral: 2.7 cm
Weight: 3040 [oz_av]

## 2024-03-20 SURGERY — LEFT HEART CATH AND CORONARY ANGIOGRAPHY
Anesthesia: LOCAL

## 2024-03-20 MED ORDER — IOHEXOL 350 MG/ML SOLN
INTRAVENOUS | Status: DC | PRN
  Administered 2024-03-20: 08:00:00 40 mL

## 2024-03-20 MED ORDER — SODIUM CHLORIDE 0.9% FLUSH: 3.0000 mL | INTRAVENOUS | Status: DC | PRN

## 2024-03-20 MED ORDER — ASPIRIN 81 MG PO CHEW: 81.0000 mg | CHEWABLE_TABLET | ORAL | Status: AC

## 2024-03-20 MED ORDER — SODIUM CHLORIDE 0.9% FLUSH: 3.0000 mL | Freq: Two times a day (BID) | INTRAVENOUS | Status: DC

## 2024-03-20 MED ORDER — MIDAZOLAM HCL (PF) 2 MG/2ML IJ SOLN
INTRAMUSCULAR | Status: DC | PRN
  Administered 2024-03-20: 08:00:00 1 mg via INTRAVENOUS

## 2024-03-20 MED ORDER — VERAPAMIL HCL 2.5 MG/ML IV SOLN
INTRAVENOUS | Status: DC | PRN
  Administered 2024-03-20: 08:00:00 10 mL via INTRA_ARTERIAL

## 2024-03-20 MED ORDER — MIDAZOLAM HCL 2 MG/2ML IJ SOLN
INTRAMUSCULAR | Status: AC
  Filled 2024-03-20: qty 2

## 2024-03-20 MED ORDER — HYDRALAZINE HCL 20 MG/ML IJ SOLN: 10.0000 mg | INTRAMUSCULAR | Status: DC | PRN

## 2024-03-20 MED ORDER — FENTANYL CITRATE (PF) 100 MCG/2ML IJ SOLN
INTRAMUSCULAR | Status: DC | PRN
  Administered 2024-03-20: 08:00:00 25 ug via INTRAVENOUS

## 2024-03-20 MED ORDER — FENTANYL CITRATE (PF) 100 MCG/2ML IJ SOLN
INTRAMUSCULAR | Status: AC
  Filled 2024-03-20: qty 2

## 2024-03-20 MED ORDER — ONDANSETRON HCL 4 MG/2ML IJ SOLN: 4.0000 mg | Freq: Four times a day (QID) | INTRAMUSCULAR | Status: DC | PRN

## 2024-03-20 MED ORDER — FREE WATER: 500.0000 mL | Freq: Once | Status: DC

## 2024-03-20 MED ORDER — ACETAMINOPHEN 325 MG PO TABS: 650.0000 mg | ORAL_TABLET | ORAL | Status: DC | PRN

## 2024-03-20 MED ORDER — HEPARIN SODIUM (PORCINE) 1000 UNIT/ML IJ SOLN
INTRAMUSCULAR | Status: AC
  Filled 2024-03-20: qty 10

## 2024-03-20 MED ORDER — SODIUM CHLORIDE 0.9 % IV SOLN: 250.0000 mL | INTRAVENOUS | Status: DC | PRN

## 2024-03-20 MED ORDER — VERAPAMIL HCL 2.5 MG/ML IV SOLN
INTRAVENOUS | Status: AC
  Filled 2024-03-20: qty 2

## 2024-03-20 MED ORDER — LIDOCAINE HCL (PF) 1 % IJ SOLN
INTRAMUSCULAR | Status: AC
  Filled 2024-03-20: qty 30

## 2024-03-20 MED ORDER — LABETALOL HCL 5 MG/ML IV SOLN: 10.0000 mg | INTRAVENOUS | Status: DC | PRN

## 2024-03-20 MED ORDER — HEPARIN SODIUM (PORCINE) 1000 UNIT/ML IJ SOLN
INTRAMUSCULAR | Status: DC | PRN
  Administered 2024-03-20: 08:00:00 4500 [IU] via INTRAVENOUS

## 2024-03-20 MED ORDER — LIDOCAINE HCL (PF) 1 % IJ SOLN
INTRAMUSCULAR | Status: DC | PRN
  Administered 2024-03-20: 08:00:00 5 mL via INTRADERMAL

## 2024-03-20 MED ORDER — HEPARIN (PORCINE) IN NACL 1000-0.9 UT/500ML-% IV SOLN
INTRAVENOUS | Status: DC | PRN
  Administered 2024-03-20: 08:00:00 1000 mL

## 2024-03-20 SURGICAL SUPPLY — 10 items
CATH INFINITI 5 FR 3DRC (CATHETERS) IMPLANT
CATH INFINITI AMBI 5FR TG (CATHETERS) IMPLANT
CATH INFINITI JR4 5F (CATHETERS) IMPLANT
DEVICE RAD COMP TR BAND LRG (VASCULAR PRODUCTS) IMPLANT
GLIDESHEATH SLEND SS 6F .021 (SHEATH) IMPLANT
GUIDEWIRE INQWIRE 1.5J.035X260 (WIRE) IMPLANT
KIT SINGLE USE MANIFOLD (KITS) IMPLANT
PACK CARDIAC CATHETERIZATION (CUSTOM PROCEDURE TRAY) ×1 IMPLANT
SET ATX-X65L (MISCELLANEOUS) IMPLANT
SHEATH PROBE COVER 6X72 (BAG) IMPLANT

## 2024-03-20 NOTE — Discharge Instructions (Signed)

## 2024-03-20 NOTE — Interval H&P Note (Signed)
 History and Physical Interval Note:  03/20/2024 8:46 AM  Randy Evans  has presented today for surgery, with the diagnosis of Chest Pain Possitive Stress Test.  The various methods of treatment have been discussed with the patient and family. After consideration of risks, benefits and other options for treatment, the patient has consented to  Procedures: LEFT HEART CATH AND CORONARY ANGIOGRAPHY (N/A) as a surgical intervention.  The patient's history has been reviewed, patient examined, no change in status, stable for surgery.  I have reviewed the patient's chart and labs.  Questions were answered to the patient's satisfaction.     Randy Evans

## 2024-03-20 NOTE — Progress Notes (Signed)
 Discharge instructions reviewed with patient and friend Ubaldo at the bedside and via telephone. PT ambulated to the bathroom was able to void without difficulty. Tolerated PO intake. No complaints of N/V. TR Band removed no s/s of complications at the incision site. PT escorted fromt he unit voa wheel chair to personal vehicle.

## 2024-03-20 NOTE — H&P (Signed)
 Cardiology Office Note:    Date:  03/20/2024   ID:  Randy Evans, DOB 1940/12/24, MRN [id number]  PCP:  Regino Slater, MD  Cardiologist:  Lonni LITTIE Nanas, MD  Electrophysiologist:  Danelle Birmingham, MD   Referring MD: Lonni Nanas, MD  C/C: Chest pain   History of Present Illness:    Randy Evans is a 83 y.o. male with a hx of atrial fibrillation/flutter, ascending aortic aneurysm, cerebral aneurysm status post coil embolization, hemochromatosis, gout who presents for follow-up.  Previously followed with Dr. [name].  Cardiac MRI 01/2023 showed LVEF 56%, no evidence of myocardial iron overload, LGE consistent with prior infarct in basal anterolateral and basal to mid inferolateral wall.  Stress PET 03/06/24 showed LAD ischemia, TID, reduced myocardial blood flow reserve, high risk study.  Zio patch x 2 weeks 02/2024 showed 1 episode of NSVT lasting 5 beats, 39 episodes of SVT longest lasting 14 seconds.  Since last clinic visit, he reports he is continuing to have chest pain.  Describes pain in upper chest that is intermittent.  Lasts for short duration.  Denies any dyspnea.  Denies any lightheadedness or syncope.   BP Readings from Last 3 Encounters:  03/20/24 (!) 142/85  03/09/24 130/72  03/06/24 100/69     Wt Readings from Last 3 Encounters:  03/20/24 86.2 kg  03/09/24 88.2 kg  02/28/24 87.7 kg     Past Medical History:  Diagnosis Date   Atrial fibrillation (HCC)    Onset 1991 in [place]    Cerebral aneurysm without rupture    treated with coil embolization 2006   Gout    Hemochromatosis    Has seen Norleen Hint   Hypertension     Past Surgical History:  Procedure Laterality Date   ANEURYSM COILING  2006   bone spur removal  1951   below left knee   BRAIN SURGERY     CARDIAC ELECTROPHYSIOLOGY MAPPING AND ABLATION  01/2008; 07/2008   CARDIOVERSION  2000; 2008 03/2008; ;02/15/2011   2008; 2009 2012:    Surgeon: LELON Jacques Blanca Mickey., MD;  Location:  [facility] OR;  Service: Cardiovascular;  Laterality: N/A;   CARDIOVERSION  08/05/2011   Procedure: CARDIOVERSION;  Surgeon: Elsie GORMAN Blanca, MD;  Location: [facility] OR;  Service: Cardiovascular;  Laterality: N/A;   CATARACT EXTRACTION W/ INTRAOCULAR LENS  IMPLANT, BILATERAL  2009-2010   IR ANGIO INTRA EXTRACRAN SEL COM CAROTID INNOMINATE UNI L MOD SED  11/09/2016   IR ANGIO INTRA EXTRACRAN SEL COM CAROTID INNOMINATE UNI R MOD SED  07/17/2019   IR ANGIO INTRA EXTRACRAN SEL INTERNAL CAROTID UNI R MOD SED  11/09/2016   IR US  GUIDE VASC ACCESS RIGHT  07/17/2019   LIVER BIOPSY  late 1990's   TOE SURGERY      Current Medications: Current Meds  Medication Sig   acetaminophen  (TYLENOL ) 500 MG tablet Take 1,000 mg by mouth every 6 (six) hours as needed for moderate pain (pain score 4-6). pain   allopurinol  (ZYLOPRIM ) 300 MG tablet Take 300 mg by mouth daily.     amLODipine  (NORVASC ) 10 MG tablet TAKE 1 TABLET DAILY   apixaban  (ELIQUIS ) 5 MG TABS tablet TAKE 1 TABLET TWICE A DAY   Ascorbic Acid (VITAMIN C PO) Take 1 tablet by mouth daily.   atorvastatin  (LIPITOR) 10 MG tablet TAKE 1 TABLET BY MOUTH EVERY DAY   carvedilol  (COREG ) 6.25 MG tablet Take 1 tablet (6.25 mg total) by mouth 2 (two) times daily.   clobetasol  cream (TEMOVATE) 0.05 % Apply 1 Application topically daily as needed (irritation).   FARXIGA 5 MG TABS tablet Take 5 mg by mouth daily.   ketoconazole (NIZORAL) 2 % cream Apply 1 Application topically daily as needed for irritation.   Multiple Vitamins-Minerals (ONE A DAY MEN 50 PLUS) TABS Take 1 tablet by mouth daily.   Omega-3 Fatty Acids (FISH OIL PO) Take 1 capsule by mouth daily.   valsartan (DIOVAN) 320 MG tablet Take 320 mg by mouth daily.     Allergies:   Codeine, Quinolones, and Sulfa antibiotics   Social History   Socioeconomic History   Marital status: Married    Spouse name: Not on file   Number of children: Not on file   Years of education: Not on file   Highest education level: Not  on file  Occupational History   Not on file  Tobacco Use   Smoking status: Former    Types: Cigarettes, Pipe, Cigars   Smokeless tobacco: Never   Tobacco comments:    quit smoking 1966  Substance and Sexual Activity   Alcohol use: Yes    Alcohol/week: 2.0 standard drinks of alcohol    Types: 2 Glasses of wine per week    Comment: SOCIAL   Drug use: No   Sexual activity: Yes    Partners: Female    Comment: MARRIED  Other Topics Concern   Not on file  Social History Narrative   Retired from [employer].  Moved from [place] . Graduate of [employer].  Married.   Social Drivers of Health   Tobacco Use: Medium Risk (03/09/2024)   Patient History    Smoking Tobacco Use: Former    Smokeless Tobacco Use: Never    Passive Exposure: Not on Actuary Strain: Not on file  Food Insecurity: No Food Insecurity (02/01/2023)   Hunger Vital Sign    Worried About Running Out of Food in the Last Year: Never true    Ran Out of Food in the Last Year: Never true  Transportation Needs: No Transportation Needs (02/01/2023)   PRAPARE - Administrator, Civil Service (Medical): No    Lack of Transportation (Non-Medical): No  Physical Activity: Not on file  Stress: Not on file  Social Connections: Not on file  Depression (PHQ2-9): Low Risk (02/01/2023)   Depression (PHQ2-9)    PHQ-2 Score: 0  Alcohol Screen: Not on file  Housing: Low Risk (02/01/2023)   Housing    Last Housing Risk Score: 0  Utilities: Not At Risk (02/01/2023)   AHC Utilities    Threatened with loss of utilities: No  Health Literacy: Not on file     Family History: The patient's family history is not on file.  ROS:   Please see the history of present illness.     All other systems reviewed and are negative.  EKGs/Labs/Other Studies Reviewed:    The following studies were reviewed today:   EKG:   01/05/2023: Sinus rhythm with first-degree AV block, rate 60, right bundle branch block, left anterior  fascicular block 12/13/2023: Sinus bradycardia with first-degree AV block, PVC, right bundle branch block, left anterior fascicular block, rate 59 03/09/2024: Sinus rhythm with first-degree AV block, rate 62, right bundle branch block, first-degree AV block, left finger fascicular block  Recent Labs: 02/28/2024: ALT 45 03/09/2024: BUN 18; Creatinine, Ser 0.78; Hemoglobin 17.5; Platelets 179; Potassium 4.7; Sodium 138; TSH 1.380  Recent Lipid Panel No results found for: CHOL, TRIG, HDL,  CHOLHDL, VLDL, LDLCALC, LDLDIRECT  Physical Exam:    VS:  BP (!) 142/85   Pulse 66   Temp 98 F (36.7 C) (Oral)   Resp 18   Ht 5' 9 (1.753 m)   Wt 86.2 kg   SpO2 97%   BMI 28.06 kg/m     Wt Readings from Last 3 Encounters:  03/20/24 86.2 kg  03/09/24 88.2 kg  02/28/24 87.7 kg     GEN:  Well nourished, well developed in no acute distress HEENT: Normal NECK: No JVD; No carotid bruits CARDIAC: RRR, no murmurs, rubs, gallops RESPIRATORY:  Clear to auscultation without rales, wheezing or rhonchi  ABDOMEN: Soft, non-tender, non-distended MUSCULOSKELETAL: Trace edema SKIN: Warm and dry NEUROLOGIC:  Alert and oriented x 3 PSYCHIATRIC:  Normal affect   ASSESSMENT:    No diagnosis found.     PLAN:    CAD: Cardiac MRI 08/2019 showed subendocardial LGE consistent with prior infarct in basal to mid inferolateral wall and basal anterolateral wall.  Overall EF 66%.  Reported chest pain and underwent stress PET 03/06/24 which showed LAD ischemia, TID, reduced myocardial blood flow reserve, high risk study. -Continue Eliquis  5 mg twice daily -Continue atorvastatin  10 mg daily -As needed sublingual nitroglycerin  -Recommend LHC. Risks and benefits of cardiac catheterization have been discussed with the patient.  These include bleeding, infection, kidney damage, stroke, heart attack, death.  The patient understands these risks and is willing to proceed.  Paroxysmal atrial fibrillation:  Follows with EP.  Status post multiple ablations.  Previously has been on Tikosyn  but off antiarrhythmic therapy.  Echocardiogram 04/2018 showed EF 50 to 55%, grade 2 diastolic dysfunction, ascending aortic dilatation measuring 43 mm.  Appears to be maintaining sinus rhythm since his last ablation in 06/2018.  Zio patch x 2 weeks 02/2024 showed 1 episode of NSVT lasting 5 beats, 39 episodes of SVT longest lasting 14 seconds. -Continue Eliquis  5 mg twice daily -Continue carvedilol   Aortic aneurysm: Measured 43 mm in ascending aorta on echo 04/2019.  CTA chest 10/2021 showed ascending aortic aneurysm measuring 46 mm.  MRI chest 01/2023 showed ascending aorta measured 42 mm, sinus of Valsalva measured 43 mm.  He follows with Dr. Lucas, CTA chest 12/2023 showed ascending aortic dilatation measuring 42 mm  Hypertension: On amlodipine  10 mg daily and carvedilol  6.25 mg twice daily and valsartan 320 mg daily.  Appears controlled  Hyperlipidemia: On atorvastatin  10 mg daily.  LDL 60 on 06/22/2023  Hemochromatosis: previously required weekly phlebotomy now only receiving rarely.  Reports diagnosis was confirmed with genetic testing in [place].   -Referred to hematology -Cardiac MRI 01/2023 showed no evidence of myocardial iron overload  Lower extremity edema: Suspect due to amlodipine  use.  Normal BNP, albumin 01/2023.  Has improved  RTC in 1 month     Medication Adjustments/Labs and Tests Ordered: Current medicines are reviewed at length with the patient today.  Concerns regarding medicines are outlined above.  Orders Placed This Encounter  Procedures   Informed Consent Details: Physician/Practitioner Attestation; Transcribe to consent form and obtain patient signature   Apply Cardiac or Vascular Catheterization and/or Intervention Care Plan   Confirm CBC and BMP (or CMP) results within 7 days for inpatient and 30 days for outpatient: Outpatients with severe anemia (hgb<10, CKD, severe thrombocytopenia  plts<100) labs should be within 10 days. Only draw PT/INR on patients that are on Coumadin, Hgb<10, have liver disease (cirrhosis, liver CA, hepatitis, etc). Urine pregnancy test within hospital admission for inpatients  of child bearing age, for outpatients day of procedure.   Confirm EKG performed within 30 days for cardiac procedures and 12 months for peripheral vascular procedures.  Place order for EKG if missing or not within timeframe.   Verify aspirin  and / or anti-platelet medication (Plavix, Effient, Brilinta) dose available for cardiac / peripheral vascular procedure day. IF ordered daily / once, adjust schedule to administer before procedure.   Weigh patient   Initiate Cath/PCI clinical path; encourage patient to watch CCTV video   Insert peripheral IV   Insert 2nd peripheral IV site-Saline lock IV   Meds ordered this encounter  Medications   aspirin  chewable tablet 81 mg   free water  500 mL   sodium chloride  flush (NS) 0.9 % injection 3 mL   sodium chloride  flush (NS) 0.9 % injection 3 mL   0.9 %  sodium chloride  infusion   Heparin  (Porcine) in NaCl 1000-0.9 UT/500ML-% SOLN    There are no outpatient Patient Instructions on file for this admission.   Signed, Newman JINNY Lawrence, MD  03/20/2024 7:43 AM    [facility]

## 2024-03-20 NOTE — Interval H&P Note (Signed)
 History and Physical Interval Note:  03/20/2024 7:44 AM  Randy Evans  has presented today for surgery, with the diagnosis of Chest Pain Possitive Stress Test.  The various methods of treatment have been discussed with the patient and family. After consideration of risks, benefits and other options for treatment, the patient has consented to  Procedures: LEFT HEART CATH AND CORONARY ANGIOGRAPHY (N/A) as a surgical intervention.  The patient's history has been reviewed, patient examined, no change in status, stable for surgery.  I have reviewed the patient's chart and labs.  Questions were answered to the patient's satisfaction.     Randy Evans J Naje Rice

## 2024-03-20 NOTE — Progress Notes (Signed)
 Ordering echocardiogram to be completed 12/16 at 3 PM post-cath, prior to CT surgery eval. Results to primary cardiologist Dr. Kate

## 2024-03-21 ENCOUNTER — Ambulatory Visit: Payer: Self-pay | Admitting: Cardiology

## 2024-03-23 ENCOUNTER — Emergency Department (HOSPITAL_COMMUNITY)

## 2024-03-23 ENCOUNTER — Other Ambulatory Visit: Payer: Self-pay

## 2024-03-23 ENCOUNTER — Inpatient Hospital Stay (HOSPITAL_COMMUNITY)
Admission: EM | Admit: 2024-03-23 | Discharge: 2024-04-01 | DRG: 234 | Disposition: A | Discharge status: Home / Self Care | Attending: Surgery | Admitting: Surgery

## 2024-03-23 ENCOUNTER — Encounter (HOSPITAL_COMMUNITY): Payer: Self-pay | Admitting: Emergency Medicine

## 2024-03-23 ENCOUNTER — Telehealth: Payer: Self-pay | Admitting: Cardiology

## 2024-03-23 DIAGNOSIS — E119 Type 2 diabetes mellitus without complications: Secondary | ICD-10-CM | POA: Diagnosis not present

## 2024-03-23 DIAGNOSIS — I480 Paroxysmal atrial fibrillation: Secondary | ICD-10-CM | POA: Diagnosis present

## 2024-03-23 DIAGNOSIS — R739 Hyperglycemia, unspecified: Secondary | ICD-10-CM

## 2024-03-23 DIAGNOSIS — Z79899 Other long term (current) drug therapy: Secondary | ICD-10-CM | POA: Diagnosis not present

## 2024-03-23 DIAGNOSIS — I4891 Unspecified atrial fibrillation: Secondary | ICD-10-CM

## 2024-03-23 DIAGNOSIS — E7800 Pure hypercholesterolemia, unspecified: Secondary | ICD-10-CM | POA: Diagnosis not present

## 2024-03-23 DIAGNOSIS — Z7902 Long term (current) use of antithrombotics/antiplatelets: Secondary | ICD-10-CM | POA: Diagnosis not present

## 2024-03-23 DIAGNOSIS — I2510 Atherosclerotic heart disease of native coronary artery without angina pectoris: Secondary | ICD-10-CM | POA: Diagnosis present

## 2024-03-23 DIAGNOSIS — I452 Bifascicular block: Secondary | ICD-10-CM | POA: Diagnosis present

## 2024-03-23 DIAGNOSIS — E88819 Insulin resistance, unspecified: Secondary | ICD-10-CM | POA: Diagnosis not present

## 2024-03-23 DIAGNOSIS — Z794 Long term (current) use of insulin: Secondary | ICD-10-CM

## 2024-03-23 DIAGNOSIS — Z01810 Encounter for preprocedural cardiovascular examination: Secondary | ICD-10-CM | POA: Diagnosis not present

## 2024-03-23 DIAGNOSIS — D696 Thrombocytopenia, unspecified: Secondary | ICD-10-CM | POA: Diagnosis not present

## 2024-03-23 DIAGNOSIS — Z885 Allergy status to narcotic agent status: Secondary | ICD-10-CM

## 2024-03-23 DIAGNOSIS — K5900 Constipation, unspecified: Secondary | ICD-10-CM | POA: Diagnosis not present

## 2024-03-23 DIAGNOSIS — Z91018 Allergy to other foods: Secondary | ICD-10-CM | POA: Diagnosis not present

## 2024-03-23 DIAGNOSIS — Z881 Allergy status to other antibiotic agents status: Secondary | ICD-10-CM | POA: Diagnosis not present

## 2024-03-23 DIAGNOSIS — I10 Essential (primary) hypertension: Secondary | ICD-10-CM | POA: Diagnosis present

## 2024-03-23 DIAGNOSIS — R079 Chest pain, unspecified: Principal | ICD-10-CM

## 2024-03-23 DIAGNOSIS — I7121 Aneurysm of the ascending aorta, without rupture: Secondary | ICD-10-CM | POA: Diagnosis present

## 2024-03-23 DIAGNOSIS — I25110 Atherosclerotic heart disease of native coronary artery with unstable angina pectoris: Secondary | ICD-10-CM | POA: Diagnosis present

## 2024-03-23 DIAGNOSIS — Z7901 Long term (current) use of anticoagulants: Secondary | ICD-10-CM

## 2024-03-23 DIAGNOSIS — Z9889 Other specified postprocedural states: Secondary | ICD-10-CM | POA: Diagnosis not present

## 2024-03-23 DIAGNOSIS — E1165 Type 2 diabetes mellitus with hyperglycemia: Secondary | ICD-10-CM | POA: Diagnosis present

## 2024-03-23 DIAGNOSIS — Z87891 Personal history of nicotine dependence: Secondary | ICD-10-CM

## 2024-03-23 DIAGNOSIS — I25119 Atherosclerotic heart disease of native coronary artery with unspecified angina pectoris: Secondary | ICD-10-CM | POA: Diagnosis not present

## 2024-03-23 DIAGNOSIS — Z9911 Dependence on respirator [ventilator] status: Secondary | ICD-10-CM | POA: Diagnosis not present

## 2024-03-23 DIAGNOSIS — Z951 Presence of aortocoronary bypass graft: Principal | ICD-10-CM

## 2024-03-23 DIAGNOSIS — E1159 Type 2 diabetes mellitus with other circulatory complications: Secondary | ICD-10-CM | POA: Diagnosis not present

## 2024-03-23 DIAGNOSIS — I471 Supraventricular tachycardia, unspecified: Secondary | ICD-10-CM | POA: Diagnosis present

## 2024-03-23 DIAGNOSIS — I2583 Coronary atherosclerosis due to lipid rich plaque: Secondary | ICD-10-CM | POA: Diagnosis not present

## 2024-03-23 DIAGNOSIS — Z882 Allergy status to sulfonamides status: Secondary | ICD-10-CM | POA: Diagnosis not present

## 2024-03-23 DIAGNOSIS — I4892 Unspecified atrial flutter: Secondary | ICD-10-CM | POA: Diagnosis not present

## 2024-03-23 DIAGNOSIS — M109 Gout, unspecified: Secondary | ICD-10-CM | POA: Diagnosis present

## 2024-03-23 DIAGNOSIS — D6959 Other secondary thrombocytopenia: Secondary | ICD-10-CM | POA: Diagnosis present

## 2024-03-23 DIAGNOSIS — E785 Hyperlipidemia, unspecified: Secondary | ICD-10-CM | POA: Diagnosis present

## 2024-03-23 DIAGNOSIS — Z7982 Long term (current) use of aspirin: Secondary | ICD-10-CM | POA: Diagnosis not present

## 2024-03-23 DIAGNOSIS — I9789 Other postprocedural complications and disorders of the circulatory system, not elsewhere classified: Secondary | ICD-10-CM | POA: Diagnosis not present

## 2024-03-23 LAB — CBG MONITORING, ED
Glucose-Capillary: 147 mg/dL — ABNORMAL HIGH (ref 70–99)
Glucose-Capillary: 91 mg/dL (ref 70–99)

## 2024-03-23 LAB — CBC
HCT: 50.1 % (ref 39.0–52.0)
Hemoglobin: 16.9 g/dL (ref 13.0–17.0)
MCH: 33.7 pg (ref 26.0–34.0)
MCHC: 33.7 g/dL (ref 30.0–36.0)
MCV: 100 fL (ref 80.0–100.0)
Platelets: 154 K/uL (ref 150–400)
RBC: 5.01 MIL/uL (ref 4.22–5.81)
RDW: 13.2 % (ref 11.5–15.5)
WBC: 9 K/uL (ref 4.0–10.5)
nRBC: 0 % (ref 0.0–0.2)

## 2024-03-23 LAB — BASIC METABOLIC PANEL WITH GFR
Anion gap: 10 (ref 5–15)
BUN: 20 mg/dL (ref 8–23)
CO2: 23 mmol/L (ref 22–32)
Calcium: 9.1 mg/dL (ref 8.9–10.3)
Chloride: 103 mmol/L (ref 98–111)
Creatinine, Ser: 0.72 mg/dL (ref 0.61–1.24)
GFR, Estimated: >60 mL/min
Glucose, Bld: 204 mg/dL — ABNORMAL HIGH (ref 70–99)
Potassium: 4.5 mmol/L (ref 3.5–5.1)
Sodium: 136 mmol/L (ref 135–145)

## 2024-03-23 LAB — TROPONIN T, HIGH SENSITIVITY
Troponin T High Sensitivity: 22 ng/L — ABNORMAL HIGH (ref 0–19)
Troponin T High Sensitivity: 19 ng/L (ref 0–19)

## 2024-03-23 MED ORDER — ACETAMINOPHEN 325 MG PO TABS
650.0000 mg | ORAL_TABLET | ORAL | Status: DC | PRN
  Administered 2024-03-25 – 2024-03-26 (×2): 650 mg via ORAL
  Filled 2024-03-23 (×2): qty 2

## 2024-03-23 MED ORDER — AMLODIPINE BESYLATE 10 MG PO TABS
10.0000 mg | ORAL_TABLET | Freq: Every day | ORAL | Status: DC
  Administered 2024-03-24 – 2024-03-26 (×3): 10 mg via ORAL
  Filled 2024-03-23: qty 1
  Filled 2024-03-23: qty 2
  Filled 2024-03-23: qty 1

## 2024-03-23 MED ORDER — INSULIN ASPART 100 UNIT/ML IJ SOLN
0.0000 [IU] | Freq: Three times a day (TID) | INTRAMUSCULAR | Status: DC
  Administered 2024-03-23 – 2024-03-25 (×4): 2 [IU] via SUBCUTANEOUS
  Administered 2024-03-25: 3 [IU] via SUBCUTANEOUS
  Administered 2024-03-25 – 2024-03-26 (×2): 2 [IU] via SUBCUTANEOUS
  Administered 2024-03-26: 3 [IU] via SUBCUTANEOUS
  Filled 2024-03-23: qty 3
  Filled 2024-03-23 (×2): qty 2
  Filled 2024-03-23: qty 3
  Filled 2024-03-23 (×4): qty 2

## 2024-03-23 MED ORDER — CARVEDILOL 6.25 MG PO TABS
6.2500 mg | ORAL_TABLET | Freq: Two times a day (BID) | ORAL | Status: DC
  Administered 2024-03-23 – 2024-03-26 (×7): 6.25 mg via ORAL
  Filled 2024-03-23 (×2): qty 2
  Filled 2024-03-23 (×5): qty 1

## 2024-03-23 MED ORDER — NITROGLYCERIN 0.4 MG SL SUBL
0.4000 mg | SUBLINGUAL_TABLET | SUBLINGUAL | Status: DC | PRN
  Administered 2024-03-24 (×3): 0.4 mg via SUBLINGUAL
  Filled 2024-03-23 (×3): qty 1

## 2024-03-23 MED ORDER — ALLOPURINOL 300 MG PO TABS
300.0000 mg | ORAL_TABLET | Freq: Every day | ORAL | Status: DC
  Administered 2024-03-24 – 2024-04-01 (×8): 300 mg via ORAL
  Filled 2024-03-23: qty 1
  Filled 2024-03-23: qty 3
  Filled 2024-03-23 (×6): qty 1

## 2024-03-23 MED ORDER — HEPARIN (PORCINE) 25000 UT/250ML-% IV SOLN
1550.0000 [IU]/h | INTRAVENOUS | Status: DC
  Administered 2024-03-23: 1200 [IU]/h via INTRAVENOUS
  Administered 2024-03-25 – 2024-03-26 (×3): 1550 [IU]/h via INTRAVENOUS
  Filled 2024-03-23 (×5): qty 250

## 2024-03-23 MED ORDER — ASPIRIN 81 MG PO CHEW
324.0000 mg | CHEWABLE_TABLET | Freq: Once | ORAL | Status: AC
  Administered 2024-03-23: 324 mg via ORAL
  Filled 2024-03-23: qty 4

## 2024-03-23 MED ORDER — ONDANSETRON HCL 4 MG/2ML IJ SOLN: 4.0000 mg | Freq: Four times a day (QID) | INTRAMUSCULAR | Status: DC | PRN

## 2024-03-23 MED ORDER — HEPARIN (PORCINE) 25000 UT/250ML-% IV SOLN: 1200.0000 [IU]/h | INTRAVENOUS | Status: DC

## 2024-03-23 MED ORDER — NITROGLYCERIN 2 % TD OINT
1.0000 [in_us] | TOPICAL_OINTMENT | Freq: Four times a day (QID) | TRANSDERMAL | Status: DC
  Administered 2024-03-23 – 2024-03-27 (×15): 1 [in_us] via TOPICAL
  Filled 2024-03-23 (×3): qty 1
  Filled 2024-03-23: qty 30
  Filled 2024-03-23 (×2): qty 1

## 2024-03-23 MED ORDER — IRBESARTAN 300 MG PO TABS
300.0000 mg | ORAL_TABLET | Freq: Every day | ORAL | Status: DC
  Administered 2024-03-24 – 2024-03-26 (×3): 300 mg via ORAL
  Filled 2024-03-23 (×3): qty 1

## 2024-03-23 MED ORDER — ASPIRIN 81 MG PO TBEC
81.0000 mg | DELAYED_RELEASE_TABLET | Freq: Every day | ORAL | Status: DC
  Administered 2024-03-24 – 2024-03-26 (×3): 81 mg via ORAL
  Filled 2024-03-23 (×3): qty 1

## 2024-03-23 MED ORDER — ATORVASTATIN CALCIUM 10 MG PO TABS
10.0000 mg | ORAL_TABLET | Freq: Every day | ORAL | Status: DC
  Administered 2024-03-24 – 2024-04-01 (×8): 10 mg via ORAL
  Filled 2024-03-23 (×8): qty 1

## 2024-03-23 NOTE — H&P (Signed)
 "  Cardiology Admission History and Physical   Patient ID: Randy Evans MRN: [id number]; DOB: 1941/03/05   Admission date: 03/23/2024  PCP:  Regino Slater, MD   [facility] Providers Cardiologist:  Lonni LITTIE Nanas, MD Electrophysiologist:  Danelle Birmingham, MD  Chief Complaint: Chest pain  Patient Profile: Randy Evans is a 83 y.o. male with atrial fibrillation status post multiple ablations, CAD with multivessel disease, cerebral aneurysm status post coil embolization, hemochromatosis, gout, diabetes, hypertension who is being seen 03/23/2024 for the evaluation of chest pain.  History of Present Illness: Randy Evans has history of hemochromatosis prompting cardiac MRI 01/2023 with no evidence of myocardial iron overload.  LGE consistent with prior infarct in the basal anterolateral and basal to mid inferolateral wall.  Followed by heme outpatient.  He has been having chest pain/fatigue and had abnormal PET stress test prompting cardiac catheterization.  03/2024 LHC with severe multivessel disease including distal LM and proximal mid LAD disease with good targets.  Plan was for outpatient CABG.  EF normal with G3 DD.  Heart monitor overall benign with 1 episode of VT lasting only 5 beats.  39 episodes of SVT longest lasting 10 beats.  Today patient is going to be admitted for CABG evaluation given ongoing complaints of chest pain.  First troponin 22.  Patient reports that after his left heart catheterization he had mild complaints of chest pain however these have been accelerating and now with progressive and worsening symptoms intermittently and with increased frequency and happening at rest now.  Yesterday he had severe episode of chest pain and then again today.  Episode today was responsive to nitroglycerin  and has not had any further reoccurrences since then.  No CP now. He reports his primary symptoms have been chest pain and fatigue.  Denied any radiating symptoms  to his arm, nausea, vomiting, diaphoresis.  He has been resting comfortably here in the emergency room.  Normally active, able to complete all of his ADLs without difficulty.     Past Medical History:  Diagnosis Date   Atrial fibrillation (HCC)    Onset 1991 in [place]    Cerebral aneurysm without rupture    treated with coil embolization 2006   Gout    Hemochromatosis    Has seen Norleen Hint   Hypertension    Past Surgical History:  Procedure Laterality Date   ANEURYSM COILING  2006   bone spur removal  1951   below left knee   BRAIN SURGERY     CARDIAC ELECTROPHYSIOLOGY MAPPING AND ABLATION  01/2008; 07/2008   CARDIOVERSION  2000; 2008 03/2008; ;02/15/2011   2008; 2009 2012:    Surgeon: LELON Jacques Blanca Mickey., MD;  Location: [facility] OR;  Service: Cardiovascular;  Laterality: N/A;   CARDIOVERSION  08/05/2011   Procedure: CARDIOVERSION;  Surgeon: Elsie GORMAN Blanca, MD;  Location: [facility] OR;  Service: Cardiovascular;  Laterality: N/A;   CATARACT EXTRACTION W/ INTRAOCULAR LENS  IMPLANT, BILATERAL  2009-2010   IR ANGIO INTRA EXTRACRAN SEL COM CAROTID INNOMINATE UNI L MOD SED  11/09/2016   IR ANGIO INTRA EXTRACRAN SEL COM CAROTID INNOMINATE UNI R MOD SED  07/17/2019   IR ANGIO INTRA EXTRACRAN SEL INTERNAL CAROTID UNI R MOD SED  11/09/2016   IR US  GUIDE VASC ACCESS RIGHT  07/17/2019   LEFT HEART CATH AND CORONARY ANGIOGRAPHY N/A 03/20/2024   Procedure: LEFT HEART CATH AND CORONARY ANGIOGRAPHY;  Surgeon: Elmira Newman JINNY, MD;  Location: [facility] INVASIVE CV LAB;  Service:  Cardiovascular;  Laterality: N/A;   LIVER BIOPSY  late 1990's   TOE SURGERY       Medications Prior to Admission: Prior to Admission medications  Medication Sig Start Date End Date Taking? Authorizing Provider  acetaminophen  (TYLENOL ) 500 MG tablet Take 1,000 mg by mouth every 6 (six) hours as needed for moderate pain (pain score 4-6). pain    [provider]  allopurinol  (ZYLOPRIM ) 300 MG tablet Take 300 mg by mouth daily.       [provider]  amLODipine  (NORVASC ) 10 MG tablet TAKE 1 TABLET DAILY 06/30/23   Waddell Danelle ORN, MD  apixaban  (ELIQUIS ) 5 MG TABS tablet TAKE 1 TABLET TWICE A DAY 06/29/23   Kate Lonni CROME, MD  Ascorbic Acid (VITAMIN C PO) Take 1 tablet by mouth daily.    [provider]  atorvastatin  (LIPITOR) 10 MG tablet TAKE 1 TABLET BY MOUTH EVERY DAY 01/24/24   Kate Lonni CROME, MD  carvedilol  (COREG ) 6.25 MG tablet Take 1 tablet (6.25 mg total) by mouth 2 (two) times daily. 12/13/23   Kate Lonni CROME, MD  clobetasol cream (TEMOVATE) 0.05 % Apply 1 Application topically daily as needed (irritation). 09/03/21   [provider]  FARXIGA 5 MG TABS tablet Take 5 mg by mouth daily. 08/23/23   [provider]  ketoconazole (NIZORAL) 2 % cream Apply 1 Application topically daily as needed for irritation. 06/16/21   [provider]  Multiple Vitamins-Minerals (ONE A DAY MEN 50 PLUS) TABS Take 1 tablet by mouth daily.    [provider]  nitroGLYCERIN  (NITROSTAT ) 0.4 MG SL tablet Place 1 tablet (0.4 mg total) under the tongue every 5 (five) minutes as needed for chest pain. 03/09/24   Kate Lonni CROME, MD  Omega-3 Fatty Acids (FISH OIL PO) Take 1 capsule by mouth daily.    [provider]  valsartan (DIOVAN) 320 MG tablet Take 320 mg by mouth daily. 07/27/20   [provider]     Allergies:   Allergies[1]  Social History:   Social History   Socioeconomic History   Marital status: Married    Spouse name: Not on file   Number of children: Not on file   Years of education: Not on file   Highest education level: Not on file  Occupational History   Not on file  Tobacco Use   Smoking status: Former    Types: Cigarettes, Pipe, Cigars   Smokeless tobacco: Never   Tobacco comments:    quit smoking 1966  Substance and Sexual Activity   Alcohol use: Yes    Alcohol/week: 2.0 standard drinks of alcohol    Types: 2  Glasses of wine per week    Comment: SOCIAL   Drug use: No   Sexual activity: Yes    Partners: Female    Comment: MARRIED  Other Topics Concern   Not on file  Social History Narrative   Retired from [employer].  Moved from [place] . Graduate of [employer].  Married.   Social Drivers of Health   Tobacco Use: Medium Risk (03/23/2024)   Patient History    Smoking Tobacco Use: Former    Smokeless Tobacco Use: Never    Passive Exposure: Not on file  Financial Resource Strain: Not on file  Food Insecurity: No Food Insecurity (02/01/2023)   Hunger Vital Sign    Worried About Running Out of Food in the Last Year: Never true    Ran Out of Food in the Last  Year: Never true  Transportation Needs: No Transportation Needs (02/01/2023)   PRAPARE - Administrator, Civil Service (Medical): No    Lack of Transportation (Non-Medical): No  Physical Activity: Not on file  Stress: Not on file  Social Connections: Not on file  Intimate Partner Violence: Not At Risk (02/01/2023)   Humiliation, Afraid, Rape, and Kick questionnaire    Fear of Current or Ex-Partner: No    Emotionally Abused: No    Physically Abused: No    Sexually Abused: No  Depression (PHQ2-9): Low Risk (02/01/2023)   Depression (PHQ2-9)    PHQ-2 Score: 0  Alcohol Screen: Not on file  Housing: Low Risk (02/01/2023)   Housing    Last Housing Risk Score: 0  Utilities: Not At Risk (02/01/2023)   AHC Utilities    Threatened with loss of utilities: No  Health Literacy: Not on file     Family History:  The patient's family history is not on file.    ROS:  Please see the history of present illness. All other ROS reviewed and negative.     Physical Exam/Data: Vitals:   03/23/24 1030 03/23/24 1235  BP: 127/87 (!) 138/94  Pulse: 61 66  Resp: 16 19  Temp: 98.5 F (36.9 C)   SpO2: 96% 100%   No intake or output data in the 24 hours ending 03/23/24 1418    03/20/2024    5:49 AM 03/09/2024   11:37 AM 02/28/2024     8:51 AM  Last 3 Weights  Weight (lbs) 190 lb 194 lb 6.4 oz 193 lb 6.4 oz  Weight (kg) 86.183 kg 88.179 kg 87.726 kg     There is no height or weight on file to calculate BMI.  General:  Well nourished, well developed, in no acute distress HEENT: normal Neck: no JVD Vascular: No carotid bruits; Distal pulses 2+ bilaterally   Cardiac:  normal S1, S2; RRR; no murmur  Lungs:  clear to auscultation bilaterally, no wheezing, rhonchi or rales  Abd: soft, nontender, no hepatomegaly  Ext: mild edema Musculoskeletal:  No deformities, BUE and BLE strength normal and equal Skin: warm and dry  Neuro:  CNs 2-12 intact, no focal abnormalities noted Psych:  Normal affect   EKG: Sinus rhythm, right bundle branch block, LAFB.  First-degree AV block PR 236.  LAFB.  Relevant CV Studies: Echocardiogram 03/20/2024 1. Left ventricular ejection fraction, by estimation, is 55 to 60%. Left  ventricular ejection fraction by 3D volume is 65 %. The left ventricle has  normal function. The left ventricle has no regional wall motion  abnormalities. There is mild left  ventricular hypertrophy. Left ventricular diastolic parameters are  consistent with Grade III diastolic dysfunction (restrictive). Elevated  left atrial pressure.   2. Right ventricular systolic function is low normal. The right  ventricular size is normal. There is moderately elevated pulmonary artery  systolic pressure. The estimated right ventricular systolic pressure is  56.9 mmHg.   3. Left atrial size was moderately dilated.   4. Right atrial size was mildly dilated.   5. The mitral valve is normal in structure. Trivial mitral valve  regurgitation. No evidence of mitral stenosis.   6. The aortic valve is tricuspid. Aortic valve regurgitation is trivial.  No aortic stenosis is present.   7. Aortic dilatation noted. There is dilatation of the ascending aorta,  measuring 44 mm.   Left heart catheterization 03/20/2024 LM: Distal,  eccentric 60% calcified stenosis LAD: Prox 90%, prox-mid  70% stenoses          Proximal septal 80% stenosis Ramus: Medium to large caliber vessel              Prox 20% disease Lcx: Large vessel. No significant disease RCA: Dominant vessel. Prox 40% disease   LVEDP 14 mmHg   Conclusion: Severe multivessel CAD, including distal LM and prox-mid LAD disease (Consistent with PET/CT findings of anterior ischemia and TID 1.38) Good surgical targets to LAD, ramus and Lcx      Recommendation: Surgical consult for CABG Patient is primary caregiver to his wife, does not have any immediate family member that live nearby. He does have good friend and social support system. I mentioned to the patient that he may need short term nursing post op and caregiver for his wife, or caregiver for both him and and his wife 63 X 7. Patient is going to look at all care giving options, but feels confident he could have the necessary support. Separately, he will also need an echocardiogram before surgical consult. Until then, continue current medial therapy and avoid physical exertion.      Laboratory Data: High Sensitivity Troponin:  No results for input(s): TROPONINIHS in the last 720 hours.  Recent Labs  Lab 03/23/24 1118  TRNPT 22*        Chemistry Recent Labs  Lab 03/23/24 1118  NA 136  K 4.5  CL 103  CO2 23  GLUCOSE 204*  BUN 20  CREATININE 0.72  CALCIUM  9.1  GFRNONAA >60  ANIONGAP 10    No results for input(s): PROT, ALBUMIN , AST, ALT, ALKPHOS, BILITOT in the last 168 hours. Lipids No results for input(s): CHOL, TRIG, HDL, LABVLDL, LDLCALC, CHOLHDL in the last 168 hours. Hematology Recent Labs  Lab 03/23/24 1118  WBC 9.0  RBC 5.01  HGB 16.9  HCT 50.1  MCV 100.0  MCH 33.7  MCHC 33.7  RDW 13.2  PLT 154   Thyroid  No results for input(s): TSH, FREET4 in the last 168 hours. BNPNo results for input(s): BNP, PROBNP in the last 168 hours.  DDimer No  results for input(s): DDIMER in the last 168 hours.  Radiology/Studies:  DG Chest 1 View Result Date: 03/23/2024 CLINICAL DATA:  Chest pain. EXAM: CHEST  1 VIEW COMPARISON:  January 25, 2014 FINDINGS: The heart size and mediastinal contours are within normal limits. Both lungs are clear. Multilevel degenerative changes are seen throughout the thoracic spine. IMPRESSION: No active disease. Electronically Signed   By: Suzen Dials M.D.   On: 03/23/2024 13:06     Assessment and Plan:  Chest pain CAD -03/2024 LHC MVD.  EF normal, G3 DD.  RVSP 56.9. Initial plan for outpatient CABG.  Due to accelerating and progressive complaints of worsening chest pain he will be admitted to the hospital for inpatient CABG evaluation.  EKG with no acute ST-T wave changes, first troponin at 22. CT surgery has been consulted Starting IV heparin , aspirin , continue carvedilol  6.25 mg twice daily.  Has as needed nitroglycerin  but can place on drip if needed.  No active chest pain. Getting lipid panel, LP(a).  PAF -History of multiple ablations.  Last 1 was at [facility] per his report. Normal maintained sinus rhythm and in sinus rhythm today. Last dose of Eliquis  was this morning.  Starting IV heparin .  Continue carvedilol  6.25 mg twice daily.  Hemochromatosis 01/2023 cardiac MRI with no evidence of myocardial iron overload.  Stable disease. Follows hematology outpatient.  No longer  requires routine phlebotomy.  Diabetes SSI here. Get A1c  Lower extremity edema Euvolemic otherwise, likely secondary to amlodipine  use.  Hypertension Blood pressure well-controlled.   Continue amlodipine  10 mg and carvedilol  6.25 mg and irbesartan  300mg  (dont have valsartant).  Aortic aneurysm CTA chest 12/2023 showed ascending aortic dilatation measuring 42 mm. Defer management to CT surgery.   Bifascicular block Monitor for bradycardia postoperatively.   Risk Assessment/Risk Scores:   CHA2DS2-VASc Score = 5   This indicates a 7.2% annual risk of stroke. The patient's score is based upon: CHF History: 0 HTN History: 1 Diabetes History: 1 Stroke History: 0 Vascular Disease History: 1 Age Score: 2 Gender Score: 0  Code Status: Full Code  Severity of Illness: The appropriate patient status for this patient is INPATIENT. Inpatient status is judged to be reasonable and necessary in order to provide the required intensity of service to ensure the patient's safety. The patient's presenting symptoms, physical exam findings, and initial radiographic and laboratory data in the context of their chronic comorbidities is felt to place them at high risk for further clinical deterioration. Furthermore, it is not anticipated that the patient will be medically stable for discharge from the hospital within 2 midnights of admission.   * I certify that at the point of admission it is my clinical judgment that the patient will require inpatient hospital care spanning beyond 2 midnights from the point of admission due to high intensity of service, high risk for further deterioration and high frequency of surveillance required.*  For questions or updates, please contact [facility] Please consult [email or web address] for contact info under       Signed, Thom LITTIE Sluder, PA-C  03/23/2024 2:18 PM       [1]  Allergies Allergen Reactions   Codeine Itching   Quinolones Other (See Comments)    Ascending thoracic aortic aneurysm    Sulfa Antibiotics Other (See Comments)    Reactions: nausea, shaking of the body   "

## 2024-03-23 NOTE — ED Notes (Signed)
 Lab called and stated patient needs a recollect on the trop due to hemolysis.

## 2024-03-23 NOTE — ED Provider Notes (Signed)
 " [facility] Provider Note   CSN: [id number] Arrival date & time: 03/23/24  1025     Patient presents with: Chest Pain   Randy Evans is a 83 y.o. male patient with past medical history of A-fib on Eliquis , hypertension, hyperlipidemia presents to emergency room with complaint of chest pain.  Patient reports that he has been having intermittent chest pain that last short period of time.  When he followed up with cardiology they did a heart cath showing severe multivessel coronary artery disease and echo reassuring.  He tells me that since his heart cath he has had a few episodes of chest pain 1 of which was when he was walking up some stairs and then to his car, he sat down and rested and took nitroglycerin  with improvement.  He reports that last night he had chest pain that woke him up from his sleep that improved with nitro and had another episode of this today. No SOB, cough, fever, swelling in lower ext.     Chest Pain      Prior to Admission medications  Medication Sig Start Date End Date Taking? Authorizing Provider  acetaminophen  (TYLENOL ) 500 MG tablet Take 1,000 mg by mouth every 6 (six) hours as needed for moderate pain (pain score 4-6). pain    [provider]  allopurinol  (ZYLOPRIM ) 300 MG tablet Take 300 mg by mouth daily.      [provider]  amLODipine  (NORVASC ) 10 MG tablet TAKE 1 TABLET DAILY 06/30/23   Waddell Danelle ORN, MD  apixaban  (ELIQUIS ) 5 MG TABS tablet TAKE 1 TABLET TWICE A DAY 06/29/23   Kate Lonni CROME, MD  Ascorbic Acid (VITAMIN C PO) Take 1 tablet by mouth daily.    [provider]  atorvastatin  (LIPITOR) 10 MG tablet TAKE 1 TABLET BY MOUTH EVERY DAY 01/24/24   Kate Lonni CROME, MD  carvedilol  (COREG ) 6.25 MG tablet Take 1 tablet (6.25 mg total) by mouth 2 (two) times daily. 12/13/23   Kate Lonni CROME, MD  clobetasol cream (TEMOVATE) 0.05 % Apply 1 Application  topically daily as needed (irritation). 09/03/21   [provider]  FARXIGA 5 MG TABS tablet Take 5 mg by mouth daily. 08/23/23   [provider]  ketoconazole (NIZORAL) 2 % cream Apply 1 Application topically daily as needed for irritation. 06/16/21   [provider]  Multiple Vitamins-Minerals (ONE A DAY MEN 50 PLUS) TABS Take 1 tablet by mouth daily.    [provider]  nitroGLYCERIN  (NITROSTAT ) 0.4 MG SL tablet Place 1 tablet (0.4 mg total) under the tongue every 5 (five) minutes as needed for chest pain. 03/09/24   Kate Lonni CROME, MD  Omega-3 Fatty Acids (FISH OIL PO) Take 1 capsule by mouth daily.    [provider]  valsartan (DIOVAN) 320 MG tablet Take 320 mg by mouth daily. 07/27/20   [provider]    Allergies: Codeine, Quinolones, and Sulfa antibiotics    Review of Systems  Cardiovascular:  Positive for chest pain.    Updated Vital Signs BP (!) 138/94   Pulse 66   Temp 98.5 F (36.9 C)   Resp 19   SpO2 100%   Physical Exam Vitals and nursing note reviewed.  Constitutional:      General: He is not in acute distress.    Appearance: He is not toxic-appearing.  HENT:     Head: Normocephalic and atraumatic.  Eyes:  General: No scleral icterus.    Conjunctiva/sclera: Conjunctivae normal.  Cardiovascular:     Rate and Rhythm: Normal rate and regular rhythm.     Pulses: Normal pulses.     Heart sounds: Normal heart sounds.  Pulmonary:     Effort: Pulmonary effort is normal. No respiratory distress.     Breath sounds: Normal breath sounds.  Abdominal:     General: Abdomen is flat. Bowel sounds are normal.     Palpations: Abdomen is soft.     Tenderness: There is no abdominal tenderness.  Musculoskeletal:     Right lower leg: No edema.     Left lower leg: No edema.  Skin:    General: Skin is warm and dry.     Findings: No lesion.  Neurological:     General: No focal deficit present.     Mental Status:  He is alert and oriented to person, place, and time. Mental status is at baseline.     (all labs ordered are listed, but only abnormal results are displayed) Labs Reviewed  BASIC METABOLIC PANEL WITH GFR - Abnormal; Notable for the following components:      Result Value   Glucose, Bld 204 (*)    All other components within normal limits  TROPONIN T, HIGH SENSITIVITY - Abnormal; Notable for the following components:   Troponin T High Sensitivity 22 (*)    All other components within normal limits  CBC  TROPONIN T, HIGH SENSITIVITY    EKG: EKG Interpretation Date/Time:  Friday March 23 2024 11:03:17 EST Ventricular Rate:  60 PR Interval:  236 QRS Duration:  146 QT Interval:  486 QTC Calculation: 486 R Axis:   -45  Text Interpretation: Sinus rhythm with 1st degree A-V block with Premature atrial complexes Right bundle branch block Left anterior fascicular block Bifascicular block Abnormal ECG When compared with ECG of 09-Mar-2024 11:40, PREVIOUS ECG IS PRESENT No significant change since last tracing Confirmed by Dean Clarity [phone]) on 03/23/2024 12:48:09 PM  Radiology: No results found.   Procedures   Medications Ordered in the ED  aspirin  chewable tablet 324 mg (324 mg Oral Given 03/23/24 1109)    Clinical Course as of 03/23/24 1327  Fri Mar 23, 2024  1316 Discussed with Carley RN with cardiology.  She reports cardiology is going to come to see the patient and be the primary admitting service. Cardiology will likely get CT surgery involved.  [JB]    Clinical Course User Index [JB] Temiloluwa Recchia, Warren SAILOR, PA-C                                 Medical Decision Making Risk Decision regarding hospitalization.   This patient presents to the ED for concern of chest pain, this involves an extensive number of treatment options, and is a complaint that carries with it a high risk of complications and morbidity.  The differential diagnosis includes ACS, stable angina, CHF,  pneumonia, pneumothorax, aortic dissection, pulmonary embolus    Co morbidities that complicate the patient evaluation  A-fib on Eliquis , hypertension, hyperlipidemia   Additional history obtained:  Additional history obtained from reviewed patient's most recent recent cardiology visit, left heart cath, and echo.   Lab Tests:  I personally interpreted labs.  The pertinent results include:   CBC and BMP are unremarkable.  Initial troponin 22    Imaging Studies ordered:  I ordered imaging studies including chest x-ray  I independently visualized and interpreted imaging which showed acute findings I agree with the radiologist interpretation   Cardiac Monitoring: / EKG:  The patient was maintained on a cardiac monitor.  I personally viewed and interpreted the cardiac monitored which showed an underlying rhythm of: EKG showing sinus rhythm with first-degree AV block, PVCs, right bundle branch block, left anterior fascicular block   Consultations Obtained:  I requested consultation with the cardiology,  and discussed lab and imaging findings as well as pertinent plan - they recommend: [place] into the hospital, Dr. Ladona cardiology to admit    Problem List / ED Course / Critical interventions / Medication management  Patient presents to emergency room with complaint of chest pain.  This has been intermittent since his left heart catheterization and becoming increasingly more frequent.  Chest pain does sometimes resolve with rest and it always improves with nitroglycerin .  On arrival he is stable and well-appearing.  He does have recent diagnosis of multivessel coronary artery disease and is going to see cardiothoracic surgery on Monday.  He is no longer having chest pain.  He denies shortness of breath fever or cough.  EKG shows no STEMI.  Initial troponin is 22.  Given high risk nature of patient and now having worsening chest pain I did reach out to cardiology who is going to  admit.     Final diagnoses:  Chest pain, unspecified type    ED Discharge Orders     None          Shermon Warren SAILOR, PA-C 03/23/24 1354  "

## 2024-03-23 NOTE — ED Provider Triage Note (Signed)
 Emergency Medicine Provider Triage Evaluation Note  Randy Evans , a 83 y.o. male  was evaluated in triage.  Pt complains of chest pain.  The patient states he has been having chest pressure that has been going on since he had a catheterization done recently.  States that the pain has responded to nitroglycerin  but the nitroglycerin  is lasting shorter and shorter periods of time.  He states that he did take nitroglycerin  just prior to walking him today and states he is not having pain currently.  States the pressure in the center of his chest.  Plan is for elective CABG prior to today.  Review of Systems  Positive: See above Negative: Leg swelling  Physical Exam  BP 127/87 (BP Location: Left Arm)   Pulse 61   Temp 98.5 F (36.9 C)   Resp 16   SpO2 96%  Gen:   Awake, no distress   Resp:  Normal effort  MSK:   Moves extremities without difficulty  Other:  Regular rate and rhythm, no murmurs  Medical Decision Making  Medically screening exam initiated at 10:55 AM.  Appropriate orders placed.  Alm JINNY Coria was informed that the remainder of the evaluation will be completed by another provider, this initial triage assessment does not replace that evaluation, and the importance of remaining in the ED until their evaluation is complete.     Ula Prentice SAUNDERS, MD 03/23/24 [phone]

## 2024-03-23 NOTE — Telephone Encounter (Signed)
 Pt had multiple test done and would like to speak with Dr. Kate for advice. Please advise.   Pt c/o of Chest Pain: STAT if active (IN THIS MOMENT) CP, including tightness, pressure, jaw pain, shoulder/upper arm/back pain, SOB, nausea, and vomiting.  1. Are you having CP right now (tightness, pressure, or discomfort)? no  2. Are you experiencing any other symptoms (ex. SOB, nausea, vomiting, sweating)? no  3. How long have you been experiencing CP? Since Tuesday   4. Is your CP continuous or coming and going? Coming and going  5. Have you taken Nitroglycerin ? yes  6. If CP returns before callback, please consider calling 911. ?

## 2024-03-23 NOTE — Progress Notes (Signed)
 PHARMACY - ANTICOAGULATION CONSULT NOTE  Pharmacy Consult for Heparin  Indication: chest pain/ACS, afib  Allergies[1]  Patient Measurements:    Vital Signs: Temp: 98.5 F (36.9 C) (12/19 1030) BP: 138/94 (12/19 1235) Pulse Rate: 66 (12/19 1235)  Labs: Recent Labs    03/23/24 1118  HGB 16.9  HCT 50.1  PLT 154  CREATININE 0.72    Estimated Creatinine Clearance: 78.8 mL/min (by C-G formula based on SCr of 0.72 mg/dL).   Medical History: Past Medical History:  Diagnosis Date   Atrial fibrillation (HCC)    Onset 1991 in [place]    Cerebral aneurysm without rupture    treated with coil embolization 2006   Gout    Hemochromatosis    Has seen Norleen Hint   Hypertension     Medications:  See med rec  Assessment: 83 y.o. M presents with CP. Noted s/p recent cath 12/16 which showed severe multivessel CAD. Pt on Eliquis  pta for afib. To hold apixaban  and CVTS to continue evaluation for CABG. Eliquis  will be affecting heparin  level so will utilize aPTT until levels correlating.  Goal of Therapy:  Heparin  level 0.3-0.7 units/ml aPTT 66-102 seconds Monitor platelets by anticoagulation protocol: Yes   Plan:  Heparin  gtt at 1200 units/hr. No bolus. Will f/u heparin  level and aPTT in 8 hours Daily heparin  level, aPTT, and CBC   Vito Ralph, PharmD, BCPS Please see amion for complete clinical pharmacist phone list 03/23/2024,1:53 PM      [1]  Allergies Allergen Reactions   Codeine Itching   Quinolones Other (See Comments)    Ascending thoracic aortic aneurysm    Sulfa Antibiotics Other (See Comments)    Reactions: nausea, shaking of the body

## 2024-03-23 NOTE — ED Triage Notes (Signed)
 Pt here from home with c/o chest pain off and on since his cath , having to take several nitroglycerin  over the lst fews day prob 5-6 in last 24hrs

## 2024-03-23 NOTE — Telephone Encounter (Signed)
 Returned patient's call, 2 identifiers used. Patient reports he had a cath on Tuesday, 03/20/24. He had been having chest discomfort for some time prior to that, but after the cath he has had worsening chest pain. He states that he had chest pain Tuesday evening when he got home. He had not shortness of breath and the chest pain came on while at rest. He states he took 2 doses of nitroglycerin  with relief.  On Wednesday, he had chest pain while walking. He denies SOB. He took one dose of nitroglycerin  with relief. He had 2-3 more similar episodes that day, again resolved with nitroglycerin .  Last night, Thursday 03/22/24, he started having chest pain again around 10:30 at night as he was getting ready for bed. He took a nitroglycerin  with relief. However, he reports he woke up several times overnight with chest pain, taking a single dose of nitroglycerin  with each. He ultimately ended up taking 5 doses of nitroglycerin  overnight. He states he is now in a state of mild discomfort. Discussed with Dr. Kate, who advised that patient be directed to go to the ED. Discussed with patient, patient verbalizes understanding and agrees to plan.

## 2024-03-23 NOTE — Consult Note (Cosign Needed Addendum)
 "     [address]       [place] [address]             [phone]        Randy Evans [facility] Medical Record #[id number] Date of Birth: 07-05-1940  Referring: Dr. Elmira, MD Primary Care: Regino Slater, MD Primary Cardiologist:Christopher LITTIE Nanas, MD Electrophysiologist: Dr. Waddell, MD  Chief Complaint:    Chief Complaint  Patient presents with   Chest Pain  Reason for consultation: Coronary artery disease  History of Present Illness:     This is an 83 year old male with a past medical history of paroxysmal atrial fibrillation/flutter, very remote tobacco abuse (quit 1966), hypertension, hyperlipidemia, diabetes mellitus, ATAA 4.2 cm (by CTA 12/2023), hemochromatosis, gout, cerebral aneurysm (s/p coil embolization 06') who presented today to [facility] ED with complaints of chest pain, despite taking Nitroglycerin . Per patient, he had dizziness and fatigue that started around October of this year. He was seen by his cardiologist, Dr. Nanas and a workup was undertaken. More recently, patient had chest discomfort/tightness, which occurred 12/16 Tuesday evening, and he took a Nitroglycerin  with relief. On Wednesday 12/17 , he left his hat in short stay (had cardiac catheterization done earlier in the day) and upon walking in to get his hat, he had chest pain. Again, he took a Nitroglycerin  which relieved his pain. Thursday night 12/18, upon going to bed,  chest pain occurred again and again was relieved with Nitroglycerin . Ultimately, he had several more episodes of chest pain and took Nitroglycerin . He called Dr. Alvan office earlier this am 12/19 and was advised to go to the ED.  Of note, he was scheduled to see Dr. Lucas on Monday as an outpatient. Patient denies shortness of breath, diaphoresis, nausea, syncope. He did endorse fatigue/loss of stamina over the last several months as well. Initial Troponin T was 22.  He had a cardiac MRI October 2024 that  showed LVEF to be 56%, mild hypokinesis of mid inferolateral wall,  basal and mid inferolateral wall LGE subendocardial and mid myocardial (most consistent with prior infarct), no evidence of myocardial iron overload,  mild dilation of sinus of Valsalva, 43 mm, mid ascending aorta 42 mm, and proximal descending aorta 36 mm, and mild mitral regurgitation. Stress PET December 2025 showed LAD ischemia, reduced myocardial blood flow reserve, high risk study. Echocardiogram done 03/20/2024 showed LVEF 55-60%, the left ventricle has no regional wall motion abnormalities, trivial MR and AI, and dilatation of the ascending aorta measuring 44 mm. Cardiac catheterization done on 03/20/2024 showed 60% left main disease, proximal 90% LAD and proximal septal 80% stenosis, no significant disease in the Circumflex and 40% in the RCA.   Per discussion with patient, he is the primary caregiver to his wife. He was fairly active until last several months.  Cardiothoracic consultation has been requested for the consideration of coronary artery bypass grafting surgery. At the time of my exam, vital signs are stable and he denies chest pain/tightness or shortness of breath.  Current Activity/ Functional Status: Patient is independent with mobility/ambulation, transfers, ADL's, IADL's.   Zubrod Score: At the time of surgery this patients most appropriate activity status/level should be described as: []     0    Normal activity, no symptoms [x]     1    Restricted in physical strenuous activity but ambulatory, able to do out light work []     2    Ambulatory and capable of  self care, unable to do work activities, up and about more than 50%  of the time                            []     3    Only limited self care, in bed greater than 50% of waking hours []     4    Completely disabled, no self care, confined to bed or chair []     5    Moribund  Past Medical History:  Diagnosis Date   Atrial fibrillation (HCC)    Onset 1991  in [place]    Cerebral aneurysm without rupture    treated with coil embolization 2006   Gout    Hemochromatosis    Has seen Norleen Hint   Hypertension   Diabetes mellitus  Past Surgical History:  Procedure Laterality Date   ANEURYSM COILING  2006   bone spur removal  1951   below left knee   BRAIN SURGERY     CARDIAC ELECTROPHYSIOLOGY MAPPING AND ABLATION  01/2008; 07/2008   CARDIOVERSION  2000; 2008 03/2008; ;02/15/2011   2008; 2009 2012:    Surgeon: LELON Jacques Blanca Mickey., MD;  Location: [facility] OR;  Service: Cardiovascular;  Laterality: N/A;   CARDIOVERSION  08/05/2011   Procedure: CARDIOVERSION;  Surgeon: Elsie GORMAN Blanca, MD;  Location: [facility] OR;  Service: Cardiovascular;  Laterality: N/A;   CATARACT EXTRACTION W/ INTRAOCULAR LENS  IMPLANT, BILATERAL  2009-2010   IR ANGIO INTRA EXTRACRAN SEL COM CAROTID INNOMINATE UNI L MOD SED  11/09/2016   IR ANGIO INTRA EXTRACRAN SEL COM CAROTID INNOMINATE UNI R MOD SED  07/17/2019   IR ANGIO INTRA EXTRACRAN SEL INTERNAL CAROTID UNI R MOD SED  11/09/2016   IR US  GUIDE VASC ACCESS RIGHT  07/17/2019   LEFT HEART CATH AND CORONARY ANGIOGRAPHY N/A 03/20/2024   Procedure: LEFT HEART CATH AND CORONARY ANGIOGRAPHY;  Surgeon: Elmira Newman PARAS, MD;  Location: [facility] INVASIVE CV LAB;  Service: Cardiovascular;  Laterality: N/A;   LIVER BIOPSY  late 1990's   TOE SURGERY    Tumor remove from left nee at age 54  Tobacco Use History[1]  Social History   Substance and Sexual Activity  Alcohol Use Yes   Alcohol/week: 2.0 standard drinks of alcohol   Types: 2 Glasses of wine per week   Comment: SOCIAL     Allergies[2]   Current Outpatient Medications  Medication Sig Dispense Refill   acetaminophen  (TYLENOL ) 500 MG tablet Take 1,000 mg by mouth every 6 (six) hours as needed for moderate pain (pain score 4-6). pain     allopurinol  (ZYLOPRIM ) 300 MG tablet Take 300 mg by mouth daily.       amLODipine  (NORVASC ) 10 MG tablet TAKE 1 TABLET DAILY 90 tablet 3   apixaban   (ELIQUIS ) 5 MG TABS tablet TAKE 1 TABLET TWICE A DAY 180 tablet 2   Ascorbic Acid (VITAMIN C PO) Take 1 tablet by mouth daily.     atorvastatin  (LIPITOR) 10 MG tablet TAKE 1 TABLET BY MOUTH EVERY DAY 90 tablet 2   carvedilol  (COREG ) 6.25 MG tablet Take 1 tablet (6.25 mg total) by mouth 2 (two) times daily. 180 tablet 3   clobetasol cream (TEMOVATE) 0.05 % Apply 1 Application topically daily as needed (irritation).     FARXIGA 5 MG TABS tablet Take 5 mg by mouth daily.     ketoconazole (NIZORAL) 2 % cream Apply 1 Application topically daily  as needed for irritation.     Multiple Vitamins-Minerals (ONE A DAY MEN 50 PLUS) TABS Take 1 tablet by mouth daily.     nitroGLYCERIN  (NITROSTAT ) 0.4 MG SL tablet Place 1 tablet (0.4 mg total) under the tongue every 5 (five) minutes as needed for chest pain. 25 tablet 1   Omega-3 Fatty Acids (FISH OIL PO) Take 1 capsule by mouth daily.     valsartan (DIOVAN) 320 MG tablet Take 320 mg by mouth daily.      Family History: Family history not on file.  Review of Systems:    Cardiac Review of Systems: Y or  [  N  ]= no  Chest Pain [ Y  ] Exertional SOB  [ Y ]  Pedal Edema [ Y  ]     Syncope  [ N ]     General Review of Systems: [Y] = yes [  ]=no Constitional: fatigue [  Y]; nausea N[  ]; night sweats [  ]; fever [ N ]; or chills [  N]                                                               Dental: Last Dentist visit: April/May of this year  Eye : Amaurosis fugax[ N ]; Resp: cough [ N ];  wheezing[  N];  hemoptysis[N  ];  GI:  vomiting[ N ];  melena[  N];  hematochezia DISCORDIA.DIESEL  ];  GU:  hematuria[N  ];              Skin: peripheral edema[ Y ];    Heme/Lymph: anemia[ N ];  Neuro: TIA[ N ];  stroke[ N ];     difficulty walking[ N ];  Endocrine: diabetes[ Y ];                 Physical Exam: BP (!) 138/94   Pulse 66   Temp 98.5 F (36.9 C)   Resp 19   SpO2 100%    General appearance: alert, cooperative, and no distress Head: Normocephalic, without  obvious abnormality, atraumatic Resp: clear to auscultation bilaterally Cardio: RRR, no murmur GI: Soft, non tender, bowel sounds present Extremities: Mild LE edema Neurologic: Grossly normal  Diagnostic Studies & Laboratory data:     Recent Radiology Findings:   DG Chest 1 View Result Date: 03/23/2024 CLINICAL DATA:  Chest pain. EXAM: CHEST  1 VIEW COMPARISON:  January 25, 2014 FINDINGS: The heart size and mediastinal contours are within normal limits. Both lungs are clear. Multilevel degenerative changes are seen throughout the thoracic spine. IMPRESSION: No active disease. Electronically Signed   By: Suzen Dials M.D.   On: 03/23/2024 13:06     I have independently reviewed the above radiologic studies and discussed with the patient   Recent Lab Findings: Lab Results  Component Value Date   WBC 9.0 03/23/2024   HGB 16.9 03/23/2024   HCT 50.1 03/23/2024   PLT 154 03/23/2024   GLUCOSE 204 (H) 03/23/2024   ALT 45 (H) 02/28/2024   AST 36 02/28/2024   NA 136 03/23/2024   K 4.5 03/23/2024   CL 103 03/23/2024   CREATININE 0.72 03/23/2024   BUN 20 03/23/2024   CO2 23 03/23/2024   TSH 1.380 03/09/2024  INR 1.2 07/17/2019    Assessment / Plan:   Coronary artery disease-He will be started on a Heparin  drip. He has preserved LVEF 55-60% by recent echocardiogram, multivessel CAD. Dr. Daniel to evaluate for coronary artery bypass grafting surgery History of paroxsymal atrial fibrillation/flutter-per patient, last ablation March [facility] at [facility]. On Apixaban  and last dose earlier this am History of hemochromatosis-previously required weekly phlebotomy. He has not had since prior to October 2024 (per oncologist last note November 2025) 4. History of hyperlipidemia-on Atorvastatin  10 mg daily prior to admission 5. History of hypertension-on Amlodipine  10 mg daily, Carvedilol  6.25 mg bid prior to admission, and Valsartan 320 mg daily 6. History of ATAA-4.2 cm by last CTA. He has been  followed by [facility] since September 2023. 7. History of diabetes mellitus-HGA1C ordered. Per patient, only on Farxiga.  I  spent 30 minutes counseling the patient face to face.   Fenton Candee PA-C 03/23/2024 1:18 PM         [1]  Social History Tobacco Use  Smoking Status Former   Types: Cigarettes, Pipe, Cigars  Smokeless Tobacco Never  Tobacco Comments   quit smoking 1966  [2]  Allergies Allergen Reactions   Codeine Itching   Quinolones Other (See Comments)    Ascending thoracic aortic aneurysm    Sulfa Antibiotics Other (See Comments)    Reactions: nausea, shaking of the body   "

## 2024-03-23 NOTE — ED Notes (Signed)
 CCMD called and verified patient on cardiac telemetry

## 2024-03-24 ENCOUNTER — Other Ambulatory Visit: Payer: Self-pay

## 2024-03-24 DIAGNOSIS — R079 Chest pain, unspecified: Secondary | ICD-10-CM | POA: Diagnosis not present

## 2024-03-24 DIAGNOSIS — I10 Essential (primary) hypertension: Secondary | ICD-10-CM | POA: Diagnosis not present

## 2024-03-24 DIAGNOSIS — I2510 Atherosclerotic heart disease of native coronary artery without angina pectoris: Secondary | ICD-10-CM

## 2024-03-24 DIAGNOSIS — I480 Paroxysmal atrial fibrillation: Secondary | ICD-10-CM | POA: Diagnosis not present

## 2024-03-24 LAB — BASIC METABOLIC PANEL WITH GFR
Anion gap: 11 (ref 5–15)
BUN: 20 mg/dL (ref 8–23)
CO2: 22 mmol/L (ref 22–32)
Calcium: 8.4 mg/dL — ABNORMAL LOW (ref 8.9–10.3)
Chloride: 103 mmol/L (ref 98–111)
Creatinine, Ser: 0.7 mg/dL (ref 0.61–1.24)
GFR, Estimated: >60 mL/min
Glucose, Bld: 108 mg/dL — ABNORMAL HIGH (ref 70–99)
Potassium: 4.3 mmol/L (ref 3.5–5.1)
Sodium: 136 mmol/L (ref 135–145)

## 2024-03-24 LAB — LIPID PANEL
Cholesterol: 86 mg/dL (ref 0–200)
HDL: 33 mg/dL — ABNORMAL LOW
LDL Cholesterol: 35 mg/dL (ref 0–99)
Total CHOL/HDL Ratio: 2.6 ratio
Triglycerides: 89 mg/dL
VLDL: 18 mg/dL (ref 0–40)

## 2024-03-24 LAB — GLUCOSE, CAPILLARY
Glucose-Capillary: 105 mg/dL — ABNORMAL HIGH (ref 70–99)
Glucose-Capillary: 139 mg/dL — ABNORMAL HIGH (ref 70–99)

## 2024-03-24 LAB — APTT
aPTT: 48 s — ABNORMAL HIGH (ref 24–36)
aPTT: 61 s — ABNORMAL HIGH (ref 24–36)
aPTT: 72 s — ABNORMAL HIGH (ref 24–36)

## 2024-03-24 LAB — CBC
HCT: 45.6 % (ref 39.0–52.0)
Hemoglobin: 15.7 g/dL (ref 13.0–17.0)
MCH: 34.4 pg — ABNORMAL HIGH (ref 26.0–34.0)
MCHC: 34.4 g/dL (ref 30.0–36.0)
MCV: 100 fL (ref 80.0–100.0)
Platelets: 129 K/uL — ABNORMAL LOW (ref 150–400)
RBC: 4.56 MIL/uL (ref 4.22–5.81)
RDW: 13.1 % (ref 11.5–15.5)
WBC: 8.2 K/uL (ref 4.0–10.5)
nRBC: 0 % (ref 0.0–0.2)

## 2024-03-24 LAB — HEMOGLOBIN A1C
Hgb A1c MFr Bld: 6.8 % — ABNORMAL HIGH (ref 4.8–5.6)
Mean Plasma Glucose: 148.46 mg/dL

## 2024-03-24 LAB — CBG MONITORING, ED
Glucose-Capillary: 130 mg/dL — ABNORMAL HIGH (ref 70–99)
Glucose-Capillary: 147 mg/dL — ABNORMAL HIGH (ref 70–99)

## 2024-03-24 LAB — HEPARIN LEVEL (UNFRACTIONATED): Heparin Unfractionated: 0.95 [IU]/mL — ABNORMAL HIGH (ref 0.30–0.70)

## 2024-03-24 NOTE — Progress Notes (Signed)
 Patient arrived to 4E-04, Ao x4. CHG wipe completed. Connected to tele CCMD notified. Denies any chest pain at the moment , heparin  gtts running. Oriented patient to room and call bell system. Call bell within reach. Plan of care continues.

## 2024-03-24 NOTE — Progress Notes (Signed)
 PHARMACY - ANTICOAGULATION CONSULT NOTE  Pharmacy Consult for heparin  Indication: CP/Afib  Labs: Recent Labs    03/23/24 1118 03/24/24 0238  HGB 16.9 15.7  HCT 50.1 45.6  PLT 154 129*  APTT  --  48*  HEPARINUNFRC  --  0.95*  CREATININE 0.72  --    Assessment: 83yo male subtherapeutic on heparin  with initial dosing while DOAC on hold; no infusion issues or signs of bleeding per RN.  Goal of Therapy:  aPTT 66-102 seconds   Plan:  Increase heparin  infusion by 2-3 units/kg/hr to 1400 units/hr. Check PTT in 8 hours.   Marvetta Dauphin, PharmD, BCPS 03/24/2024 3:01 AM

## 2024-03-24 NOTE — Progress Notes (Signed)
 " Cardiology Progress Note:   Patient ID: JOAOVICTOR KRONE MRN: [id number]; DOB: 12-29-1940  Admit date: 03/23/2024 Date of Consult: 03/24/2024  Primary Care Provider: Regino Slater, MD [facility] Cardiologist: Lonni LITTIE Nanas, MD  [facility] Electrophysiologist:  Danelle Birmingham, MD   Patient Profile:   Randy Evans is a 83 y.o. male with a AF s/p multiple ablations, CAD with MVD, cerebral aneurysm s/p coil embolization, hemochromatosis, gout, DM2, and HTN who is admitted for CP and is here for expedited CABG evaluation.  Interval Events:   CP free and undergoing CABG evaluation. On heparin  gtt and with nitroglycerin  patch.  Last dose Eliquis  12/19 AM.  Past Medical History:  Diagnosis Date   Atrial fibrillation (HCC)    Onset 1991 in [place]    Cerebral aneurysm without rupture    treated with coil embolization 2006   Gout    Hemochromatosis    Has seen Norleen Hint   Hypertension    Past Surgical History:  Procedure Laterality Date   ANEURYSM COILING  2006   bone spur removal  1951   below left knee   BRAIN SURGERY     CARDIAC ELECTROPHYSIOLOGY MAPPING AND ABLATION  01/2008; 07/2008   CARDIOVERSION  2000; 2008 03/2008; ;02/15/2011   2008; 2009 2012:    Surgeon: LELON Jacques Blanca Mickey., MD;  Location: [facility] OR;  Service: Cardiovascular;  Laterality: N/A;   CARDIOVERSION  08/05/2011   Procedure: CARDIOVERSION;  Surgeon: Elsie GORMAN Blanca, MD;  Location: [facility] OR;  Service: Cardiovascular;  Laterality: N/A;   CATARACT EXTRACTION W/ INTRAOCULAR LENS  IMPLANT, BILATERAL  2009-2010   IR ANGIO INTRA EXTRACRAN SEL COM CAROTID INNOMINATE UNI L MOD SED  11/09/2016   IR ANGIO INTRA EXTRACRAN SEL COM CAROTID INNOMINATE UNI R MOD SED  07/17/2019   IR ANGIO INTRA EXTRACRAN SEL INTERNAL CAROTID UNI R MOD SED  11/09/2016   IR US  GUIDE VASC ACCESS RIGHT  07/17/2019   LEFT HEART CATH AND CORONARY ANGIOGRAPHY N/A 03/20/2024   Procedure: LEFT HEART CATH AND CORONARY ANGIOGRAPHY;  Surgeon:  Elmira Newman JINNY, MD;  Location: [facility] INVASIVE CV LAB;  Service: Cardiovascular;  Laterality: N/A;   LIVER BIOPSY  late 1990's   TOE SURGERY      Inpatient Medications: Scheduled Meds:  allopurinol   300 mg Oral Daily   amLODipine   10 mg Oral Daily   aspirin  EC  81 mg Oral Daily   atorvastatin   10 mg Oral Daily   carvedilol   6.25 mg Oral BID   insulin  aspart  0-15 Units Subcutaneous TID WC   irbesartan   300 mg Oral Daily   nitroGLYCERIN   1 inch Topical Q6H   Continuous Infusions:  heparin  1,550 Units/hr (03/24/24 1305)   PRN Meds: acetaminophen , nitroGLYCERIN , ondansetron  (ZOFRAN ) IV  Allergies:   Allergies[1]  Social History:   Social History   Socioeconomic History   Marital status: Married    Spouse name: Not on file   Number of children: Not on file   Years of education: Not on file   Highest education level: Not on file  Occupational History   Not on file  Tobacco Use   Smoking status: Former    Types: Cigarettes, Pipe, Cigars   Smokeless tobacco: Never   Tobacco comments:    quit smoking 1966  Substance and Sexual Activity   Alcohol use: Yes    Alcohol/week: 2.0 standard drinks of alcohol    Types: 2 Glasses of wine per week  Comment: SOCIAL   Drug use: No   Sexual activity: Yes    Partners: Female    Comment: MARRIED  Other Topics Concern   Not on file  Social History Narrative   Retired from [employer].  Moved from [place] . Graduate of [employer].  Married.   Social Drivers of Health   Tobacco Use: Medium Risk (03/23/2024)   Patient History    Smoking Tobacco Use: Former    Smokeless Tobacco Use: Never    Passive Exposure: Not on Actuary Strain: Not on file  Food Insecurity: No Food Insecurity (03/24/2024)   Epic    Worried About Programme Researcher, Broadcasting/film/video in the Last Year: Never true    Ran Out of Food in the Last Year: Never true  Transportation Needs: No Transportation Needs (03/24/2024)   Epic    Lack of Transportation (Medical): No     Lack of Transportation (Non-Medical): No  Physical Activity: Not on file  Stress: Not on file  Social Connections: Socially Integrated (03/24/2024)   Social Connection and Isolation Panel    Frequency of Communication with Friends and Family: More than three times a week    Frequency of Social Gatherings with Friends and Family: More than three times a week    Attends Religious Services: More than 4 times per year    Active Member of [employer] or Organizations: Yes    Attends Banker Meetings: More than 4 times per year    Marital Status: Married  Catering Manager Violence: Not At Risk (03/24/2024)   Epic    Fear of Current or Ex-Partner: No    Emotionally Abused: No    Physically Abused: No    Sexually Abused: No  Depression (PHQ2-9): Low Risk (02/01/2023)   Depression (PHQ2-9)    PHQ-2 Score: 0  Alcohol Screen: Not on file  Housing: Low Risk (03/24/2024)   Epic    Unable to Pay for Housing in the Last Year: No    Number of Times Moved in the Last Year: 0    Homeless in the Last Year: No  Utilities: Not At Risk (03/24/2024)   Epic    Threatened with loss of utilities: No  Health Literacy: Not on file    Family History:   History reviewed. No pertinent family history.   ROS:  Review of Systems: [y] = yes, [ ]  = no      General: Weight gain [ ] ; Weight loss [ ] ; Anorexia [ ] ; Fatigue [ ] ; Fever [ ] ; Chills [ ] ; Weakness [ ]    Cardiac: Chest pain/pressure [y]; Resting SOB [ ] ; Exertional SOB [ ] ; Orthopnea [ ] ; Pedal Edema [ ] ; Palpitations [ ] ; Syncope [ ] ; Presyncope [ ] ; Paroxysmal nocturnal dyspnea [ ]    Pulmonary: Cough [ ] ; Wheezing [ ] ; Hemoptysis [ ] ; Sputum [ ] ; Snoring [ ]    GI: Vomiting [ ] ; Dysphagia [ ] ; Melena [ ] ; Hematochezia [ ] ; Heartburn [ ] ; Abdominal pain [ ] ; Constipation [ ] ; Diarrhea [ ] ; BRBPR [ ]    GU: Hematuria [ ] ; Dysuria [ ] ; Nocturia [ ]  Vascular: Pain in legs with walking [ ] ; Pain in feet with lying flat [ ] ; Non-healing sores [ ] ;  Stroke [ ] ; TIA [ ] ; Slurred speech [ ] ;   Neuro: Headaches [ ] ; Vertigo [ ] ; Seizures [ ] ; Paresthesias [ ] ;Blurred vision [ ] ; Diplopia [ ] ; Vision changes [ ]    Ortho/Skin: Arthritis [ ] ;  Joint pain [ ] ; Muscle pain [ ] ; Joint swelling [ ] ; Back Pain [ ] ; Rash [ ]    Psych: Depression [ ] ; Anxiety [ ]    Heme: Bleeding problems [ ] ; Clotting disorders [ ] ; Anemia [ ]    Endocrine: Diabetes [ ] ; Thyroid  dysfunction [ ]    Physical Exam/Data:   Vitals:   03/24/24 0630 03/24/24 0910 03/24/24 1200 03/24/24 1248  BP: 119/80 (!) 141/87 (!) 143/100   Pulse: 63 65 71   Resp: 15 18 20    Temp:  97.8 F (36.6 C)    TempSrc:  Oral    SpO2: 99% 98% 97%   Weight:    86.2 kg  Height:  5' 9 (1.753 m)  5' 9 (1.753 m)   Intake/Output Summary (Last 24 hours) at 03/24/2024 1421 Last data filed at 03/23/2024 1509 Gross per 24 hour  Intake --  Output 200 ml  Net -200 ml      03/24/2024   12:48 PM 03/20/2024    5:49 AM 03/09/2024   11:37 AM  Last 3 Weights  Weight (lbs) 190 lb 0.6 oz 190 lb 194 lb 6.4 oz  Weight (kg) 86.2 kg 86.183 kg 88.179 kg     Body mass index is 28.06 kg/m.  General:  Well nourished, well developed, in no acute distress HEENT: normal Cardiac:  normal S1, S2; RRR; no murmur  Lungs: clear to auscultation bilaterally, no wheezing, rhonchi or rales  Ext: no edema Musculoskeletal:  No deformities, BUE and BLE strength normal and equal Skin: warm and dry  Neuro:  CNs 2-12 intact, no focal abnormalities noted Psych:  Normal affect   ECG review 03/24/24: NSR 67, PR 256, QRS 159, QT/c 492/520, 1' AVB, RBBB/LAFB, isolated PVCs 03/23/24: NSR 60, PR 236, QRS 146, QT/c 486/486, 1' AVB, RBBB/LAFB 03/09/24: NSR 62, PR 236, QRS 144, QT/c 460/466, 1' AVB, RBBB  Telemetry: Telemetry was personally reviewed and demonstrates:  NSR 60-70s   Relevant CV Studies:  TTE Result date: 03/20/24  1. Left ventricular ejection fraction, by estimation, is 55 to 60%. Left  ventricular  ejection fraction by 3D volume is 65 %. The left ventricle has  normal function. The left ventricle has no regional wall motion  abnormalities. There is mild left  ventricular hypertrophy. Left ventricular diastolic parameters are  consistent with Grade III diastolic dysfunction (restrictive). Elevated  left atrial pressure.   2. Right ventricular systolic function is low normal. The right  ventricular size is normal. There is moderately elevated pulmonary artery  systolic pressure. The estimated right ventricular systolic pressure is  56.9 mmHg.   3. Left atrial size was moderately dilated.   4. Right atrial size was mildly dilated.   5. The mitral valve is normal in structure. Trivial mitral valve  regurgitation. No evidence of mitral stenosis.   6. The aortic valve is tricuspid. Aortic valve regurgitation is trivial.  No aortic stenosis is present.   7. Aortic dilatation noted. There is dilatation of the ascending aorta,  measuring 44 mm.   Laboratory Data:  Chemistry Recent Labs  Lab 03/23/24 1118 03/24/24 0238  NA 136 136  K 4.5 4.3  CL 103 103  CO2 23 22  GLUCOSE 204* 108*  BUN 20 20  CREATININE 0.72 0.70  CALCIUM  9.1 8.4*  GFRNONAA >60 >60  ANIONGAP 10 11   Hematology Recent Labs  Lab 03/23/24 1118 03/24/24 0238  WBC 9.0 8.2  RBC 5.01 4.56  HGB 16.9 15.7  HCT  50.1 45.6  MCV 100.0 100.0  MCH 33.7 34.4*  MCHC 33.7 34.4  RDW 13.2 13.1  PLT 154 129*   Radiology/Studies:  DG Chest 1 View Result Date: 03/23/2024 CLINICAL DATA:  Chest pain. EXAM: CHEST  1 VIEW COMPARISON:  January 25, 2014 FINDINGS: The heart size and mediastinal contours are within normal limits. Both lungs are clear. Multilevel degenerative changes are seen throughout the thoracic spine. IMPRESSION: No active disease. Electronically Signed   By: Suzen Dials M.D.   On: 03/23/2024 13:06   ECHOCARDIOGRAM COMPLETE Result Date: 03/20/2024    ECHOCARDIOGRAM REPORT   Patient Name:   Randle PSALM SCHAPPELL Date of Exam: 03/20/2024 Medical Rec #:  [id number]        Height:       69.0 in Accession #:    [id number]       Weight:       190.0 lb Date of Birth:  11/02/1940        BSA:          2.021 m Patient Age:    83 years         BP:           130/72 mmHg Patient Gender: M                HR:           51 bpm. Exam Location:  [address] Procedure: 2D Echo, 3D Echo, Cardiac Doppler and Color Doppler (Both Spectral            and Color Flow Doppler were utilized during procedure). Indications:    R07.9 Chest Pain  History:        Patient has prior history of Echocardiogram examinations, most                 recent 04/25/2018. Arrythmias:Atrial Fibrillation,                 Signs/Symptoms:Chest Pain; Risk Factors:Family History of                 Coronary Artery Disease, Hypertension and Former Smoker.                 Pre-Operative Eval For CABG, Hemochromatosis, Ascending Aortic                 Aneurysm.  Sonographer:    Heather Hawks RDCS Referring Phys: ELFRIEDA HAGGARD IMPRESSIONS  1. Left ventricular ejection fraction, by estimation, is 55 to 60%. Left ventricular ejection fraction by 3D volume is 65 %. The left ventricle has normal function. The left ventricle has no regional wall motion abnormalities. There is mild left ventricular hypertrophy. Left ventricular diastolic parameters are consistent with Grade III diastolic dysfunction (restrictive). Elevated left atrial pressure.  2. Right ventricular systolic function is low normal. The right ventricular size is normal. There is moderately elevated pulmonary artery systolic pressure. The estimated right ventricular systolic pressure is 56.9 mmHg.  3. Left atrial size was moderately dilated.  4. Right atrial size was mildly dilated.  5. The mitral valve is normal in structure. Trivial mitral valve regurgitation. No evidence of mitral stenosis.  6. The aortic valve is tricuspid. Aortic valve regurgitation is trivial. No aortic stenosis is present.  7. Aortic  dilatation noted. There is dilatation of the ascending aorta, measuring 44 mm. FINDINGS  Left Ventricle: Left ventricular ejection fraction, by estimation, is 55 to 60%. Left ventricular ejection fraction by 3D volume is 65 %.  The left ventricle has normal function. The left ventricle has no regional wall motion abnormalities. The left ventricular internal cavity size was normal in size. There is mild left ventricular hypertrophy. Left ventricular diastolic parameters are consistent with Grade III diastolic dysfunction (restrictive). Elevated left atrial pressure. Right Ventricle: The right ventricular size is normal. No increase in right ventricular wall thickness. Right ventricular systolic function is low normal. There is moderately elevated pulmonary artery systolic pressure. The tricuspid regurgitant velocity  is 3.67 m/s, and with an assumed right atrial pressure of 3 mmHg, the estimated right ventricular systolic pressure is 56.9 mmHg. Left Atrium: Left atrial size was moderately dilated. Right Atrium: Right atrial size was mildly dilated. Pericardium: There is no evidence of pericardial effusion. Mitral Valve: The mitral valve is normal in structure. Trivial mitral valve regurgitation. No evidence of mitral valve stenosis. Tricuspid Valve: The tricuspid valve is normal in structure. Tricuspid valve regurgitation is mild. Aortic Valve: The aortic valve is tricuspid. Aortic valve regurgitation is trivial. Aortic regurgitation PHT measures 665 msec. No aortic stenosis is present. Pulmonic Valve: The pulmonic valve was not well visualized. Pulmonic valve regurgitation is trivial. Aorta: The aortic root is normal in size and structure and aortic dilatation noted. There is dilatation of the ascending aorta, measuring 44 mm. IAS/Shunts: The interatrial septum was not well visualized. Additional Comments: 3D was performed not requiring image post processing on an independent workstation and was normal.  LEFT  VENTRICLE PLAX 2D LVIDd:         5.20 cm         Diastology LVIDs:         2.70 cm         LV e' medial:    5.93 cm/s LV PW:         1.10 cm         LV E/e' medial:  15.6 LV IVS:        1.10 cm         LV e' lateral:   10.09 cm/s LVOT diam:     2.60 cm         LV E/e' lateral: 9.2 LV SV:         114 LV SV Index:   56 LVOT Area:     5.31 cm        3D Volume EF                                LV 3D EF:    Left                                             ventricul                                             ar                                             ejection  fraction                                             by 3D                                             volume is                                             65 %.                                 3D Volume EF:                                3D EF:        65 %                                LV EDV:       160 ml                                LV ESV:       56 ml                                LV SV:        104 ml RIGHT VENTRICLE RV Basal diam:  3.90 cm    PULMONARY VEINS RV S prime:     9.70 cm/s  A Reversal Velocity: 32.30 cm/s TAPSE (M-mode): 1.9 cm     Diastolic Velocity:  65.80 cm/s RVSP:           61.9 mmHg  S/D Velocity:        0.40                            Systolic Velocity:   25.50 cm/s LEFT ATRIUM             Index        RIGHT ATRIUM           Index LA diam:        4.50 cm 2.23 cm/m   RA Pressure: 8.00 mmHg LA Vol (A2C):   94.4 ml 46.70 ml/m  RA Area:     24.10 cm LA Vol (A4C):   90.4 ml 44.72 ml/m  RA Volume:   79.10 ml  39.13 ml/m LA Biplane Vol: 93.4 ml 46.20 ml/m  AORTIC VALVE LVOT Vmax:   101.75 cm/s LVOT Vmean:  66.300 cm/s LVOT VTI:    0.214 m AI PHT:      665 msec  AORTA Ao Root diam: 3.80 cm Ao Asc diam:  4.25 cm MITRAL VALVE               TRICUSPID VALVE MV Area (PHT)  cm         TR Peak grad:   53.9 mmHg MV Decel Time: 252 msec    TR Vmax:        367.00 cm/s MV E velocity: 92.70 cm/s   Estimated RAP:  8.00 mmHg MV A velocity: 26.65 cm/s  RVSP:           61.9 mmHg MV E/A ratio:  3.48                            SHUNTS                            Systemic VTI:  0.21 m                            Systemic Diam: 2.60 cm Lonni Nanas MD Electronically signed by Lonni Nanas MD Signature Date/Time: 03/20/2024/5:19:27 PM    Final    Assessment and Plan:  KAYD LAUNER is a 83 y.o. male with a AF s/p multiple ablations, CAD with MVD, cerebral aneurysm s/p coil embolization, hemochromatosis, gout, DM2, and HTN who is admitted for CP and is here for expedited CABG evaluation.  Chest pain CAD -03/2024 LHC MVD.  EF normal, G3 DD.  RVSP 56.9. Initial plan for outpatient CABG.  Due to accelerating and progressive complaints of worsening chest pain he will be admitted to the hospital for inpatient CABG evaluation.  EKG with no acute ST-T wave changes, hsT flat (22->19) CTS consulted and planning tentatively Tuesday for CABG  Continue IV heparin , aspirin , continue carvedilol  6.25 mg twice daily. Has nitroglycerin  patch. No active chest pain. Lpa pending, LDL 35   PAF - History of multiple ablations.  Last at [facility] per his report. Normal maintained sinus rhythm and in sinus rhythm today. Last dose of Eliquis  was 12/19 AM. Continue IV heparin .  Continue carvedilol  6.25 mg twice daily.   Hemochromatosis 01/2023 cardiac MRI with no evidence of myocardial iron overload.  Stable disease. Follows hematology outpatient.  No longer requires routine phlebotomy.   Diabetes SSI here. A1c 6.8   Lower extremity edema Euvolemic otherwise    Hypertension Blood pressure well-controlled.   Continue amlodipine  10 mg and carvedilol  6.25 mg and irbesartan  300 mg (dont have valsartant).   Aortic aneurysm CTA chest 12/2023 showed ascending aortic dilatation measuring 42 mm. Defer management to CT surgery.    Bifascicular block Monitor for bradycardia postoperatively.   Risk  Assessment/Risk Scores:   CHA2DS2-VASc Score = 5  This indicates a 7.2% annual risk of stroke. The patient's score is based upon: CHF History: 0 HTN History: 1 Diabetes History: 1 Stroke History: 0 Vascular Disease History: 1 Age Score: 2 Gender Score: 0   Code Status: Full Code   For questions or updates, please contact [facility] Please consult [email or web address] for contact info under   Signed, Donnice DELENA Primus, MD  03/24/2024 2:21 PM     [1]  Allergies Allergen Reactions   Codeine Itching   Quinolones Other (See Comments)    Ascending thoracic aortic aneurysm    Sulfa Antibiotics Other (See Comments)    Reactions: nausea, shaking of the body   Wheat Rash   "

## 2024-03-24 NOTE — Plan of Care (Signed)

## 2024-03-24 NOTE — Progress Notes (Addendum)
 ANTICOAGULATION CONSULT NOTE  Pharmacy Consult for Heparin  Indication: chest pain/ACS and atrial fibrillation  Allergies[1]  Patient Measurements: Height: 5' 9 (175.3 cm) IBW/kg (Calculated) : 70.7 Heparin  Dosing Weight: 86.2 kg  Vital Signs: Temp: 97.8 F (36.6 C) (12/20 0910) Temp Source: Oral (12/20 0910) BP: 143/100 (12/20 1200) Pulse Rate: 71 (12/20 1200)  Labs: Recent Labs    03/23/24 1118 03/24/24 0238 03/24/24 1100  HGB 16.9 15.7  --   HCT 50.1 45.6  --   PLT 154 129*  --   APTT  --  48* 61*  HEPARINUNFRC  --  0.95*  --   CREATININE 0.72 0.70  --     Estimated Creatinine Clearance: 78.8 mL/min (by C-G formula based on SCr of 0.7 mg/dL).   Medical History: Past Medical History:  Diagnosis Date   Atrial fibrillation (HCC)    Onset 1991 in [place]    Cerebral aneurysm without rupture    treated with coil embolization 2006   Gout    Hemochromatosis    Has seen Norleen Hint   Hypertension     Medications:  (Not in a hospital admission)  Scheduled:   allopurinol   300 mg Oral Daily   amLODipine   10 mg Oral Daily   aspirin  EC  81 mg Oral Daily   atorvastatin   10 mg Oral Daily   carvedilol   6.25 mg Oral BID   insulin  aspart  0-15 Units Subcutaneous TID WC   irbesartan   300 mg Oral Daily   nitroGLYCERIN   1 inch Topical Q6H   Infusions:   heparin  1,400 Units/hr (03/24/24 0307)   PRN: acetaminophen , nitroGLYCERIN , ondansetron  (ZOFRAN ) IV  Assessment: 83 y.o. M presents with CP. Noted s/p recent cath 12/16 which showed severe multivessel CAD. Pt on Eliquis  pta for afib. Last dose 12/19 am. To hold apixaban  and CVTS to continue evaluation for CABG.  Utilizing aPTT monitoring due to likely falsely high anti-Xa level secondary to DOAC use.  Heparin  level monitoring: 12/20 0238 aPTT/HL 48/0.95 -- Heparin  level still elevated from apixaban  -- aPTT sub-therapeutic -- rate increased to 1400 units/hr 12/20 1100 aPTT 61 -- remains borderline  sub-therapeutic  No issues with infusion or with bleeding per RN.  Hgb 16.9>15.7; plt 154>129  Goal of Therapy:  Heparin  level 0.3-0.7 units/ml aPTT 66-102 seconds Monitor platelets by anticoagulation protocol: Yes   Plan:  Increase heparin  infusion to 1550 units/hr Check aPTT level at 8pm  Daily aPTT and heparin  level while on heparin  Continue to monitor via aPTT until levels are correlated Continue to monitor H&H and platelets  Dorn Buttner, PharmD, BCPS 03/24/2024 12:48 PM ED Clinical Pharmacist -  [phone]        [1]  Allergies Allergen Reactions   Codeine Itching   Quinolones Other (See Comments)    Ascending thoracic aortic aneurysm    Sulfa Antibiotics Other (See Comments)    Reactions: nausea, shaking of the body   Wheat Rash

## 2024-03-24 NOTE — Progress Notes (Signed)
 ANTICOAGULATION CONSULT NOTE  Pharmacy Consult for Heparin  Indication: chest pain/ACS and atrial fibrillation  Allergies[1]  Patient Measurements: Height: 5' 9 (175.3 cm) Weight: 86.2 kg (190 lb 0.6 oz) IBW/kg (Calculated) : 70.7 Heparin  Dosing Weight: 86.2 kg  Vital Signs: Temp: 98.3 F (36.8 C) (12/20 2009) Temp Source: Oral (12/20 2009) BP: 127/79 (12/20 2100) Pulse Rate: 65 (12/20 1757)  Labs: Recent Labs    03/23/24 1118 03/24/24 0238 03/24/24 1100 03/24/24 2035  HGB 16.9 15.7  --   --   HCT 50.1 45.6  --   --   PLT 154 129*  --   --   APTT  --  48* 61* 72*  HEPARINUNFRC  --  0.95*  --   --   CREATININE 0.72 0.70  --   --     Estimated Creatinine Clearance: 78.8 mL/min (by C-G formula based on SCr of 0.7 mg/dL).   Medical History: Past Medical History:  Diagnosis Date   Atrial fibrillation (HCC)    Onset 1991 in [place]    Cerebral aneurysm without rupture    treated with coil embolization 2006   Gout    Hemochromatosis    Has seen Norleen Hint   Hypertension     Medications:  Medications Prior to Admission  Medication Sig Dispense Refill Last Dose/Taking   acetaminophen  (TYLENOL ) 500 MG tablet Take 1,000 mg by mouth daily as needed for moderate pain (pain score 4-6) or mild pain (pain score 1-3).   03/23/2024   allopurinol  (ZYLOPRIM ) 300 MG tablet Take 300 mg by mouth daily.     03/23/2024   amLODipine  (NORVASC ) 10 MG tablet TAKE 1 TABLET DAILY 90 tablet 3 03/23/2024   apixaban  (ELIQUIS ) 5 MG TABS tablet TAKE 1 TABLET TWICE A DAY 180 tablet 2 03/23/2024 at  6:30 AM   Ascorbic Acid (VITAMIN C PO) Take 1 tablet by mouth 4 (four) times a week.   03/22/2024   atorvastatin  (LIPITOR) 10 MG tablet TAKE 1 TABLET BY MOUTH EVERY DAY 90 tablet 2 03/23/2024   carvedilol  (COREG ) 6.25 MG tablet Take 1 tablet (6.25 mg total) by mouth 2 (two) times daily. 180 tablet 3 03/23/2024   clobetasol cream (TEMOVATE) 0.05 % Apply 1 Application topically daily as needed  (irritation).   Unknown   FARXIGA 5 MG TABS tablet Take 5 mg by mouth at bedtime.   03/22/2024   ketoconazole (NIZORAL) 2 % cream Apply 1 Application topically daily as needed for irritation.   Unknown   Multiple Vitamins-Minerals (ONE A DAY MEN 50 PLUS) TABS Take 1 tablet by mouth 4 (four) times a week.   03/22/2024   nitroGLYCERIN  (NITROSTAT ) 0.4 MG SL tablet Place 1 tablet (0.4 mg total) under the tongue every 5 (five) minutes as needed for chest pain. 25 tablet 1 03/23/2024   Omega-3 Fatty Acids (FISH OIL PO) Take 1 capsule by mouth daily.   03/22/2024   valsartan (DIOVAN) 320 MG tablet Take 320 mg by mouth daily.   03/23/2024   Scheduled:   allopurinol   300 mg Oral Daily   amLODipine   10 mg Oral Daily   aspirin  EC  81 mg Oral Daily   atorvastatin   10 mg Oral Daily   carvedilol   6.25 mg Oral BID   insulin  aspart  0-15 Units Subcutaneous TID WC   irbesartan   300 mg Oral Daily   nitroGLYCERIN   1 inch Topical Q6H   Infusions:   heparin  1,550 Units/hr (03/24/24 1305)   PRN: acetaminophen , nitroGLYCERIN , ondansetron  (  ZOFRAN ) IV  Assessment: 83 y.o. M presents with CP. Noted s/p recent cath 12/16 which showed severe multivessel CAD. Pt on Eliquis  pta for afib. Last dose 12/19 am. To hold apixaban  and CVTS to continue evaluation for CABG.  Utilizing aPTT monitoring due to likely falsely high anti-Xa level secondary to DOAC use.  Heparin  drip 1550 uts/hr with aptt 72sec at goal No issues with infusion or with bleeding per RN.  Hgb 16.9>15.7; plt 154>129  Goal of Therapy:  Heparin  level 0.3-0.7 units/ml aPTT 66-102 seconds Monitor platelets by anticoagulation protocol: Yes   Plan:  Continue heparin  infusion at 1550 units/hr  Daily aPTT and heparin  level while on heparin  Continue to monitor via aPTT until levels are correlated Continue to monitor H&H and platelets    Olam Chalk Pharm.D. CPP, BCPS Clinical Pharmacist [phone] 03/24/2024 9:23 PM          [1]   Allergies Allergen Reactions   Codeine Itching   Quinolones Other (See Comments)    Ascending thoracic aortic aneurysm    Sulfa Antibiotics Other (See Comments)    Reactions: nausea, shaking of the body   Wheat Rash

## 2024-03-25 ENCOUNTER — Inpatient Hospital Stay (HOSPITAL_COMMUNITY)

## 2024-03-25 DIAGNOSIS — I10 Essential (primary) hypertension: Secondary | ICD-10-CM | POA: Diagnosis not present

## 2024-03-25 DIAGNOSIS — R079 Chest pain, unspecified: Secondary | ICD-10-CM | POA: Diagnosis not present

## 2024-03-25 DIAGNOSIS — I2510 Atherosclerotic heart disease of native coronary artery without angina pectoris: Secondary | ICD-10-CM | POA: Diagnosis not present

## 2024-03-25 DIAGNOSIS — I480 Paroxysmal atrial fibrillation: Secondary | ICD-10-CM | POA: Diagnosis not present

## 2024-03-25 LAB — GLUCOSE, CAPILLARY
Glucose-Capillary: 126 mg/dL — ABNORMAL HIGH (ref 70–99)
Glucose-Capillary: 130 mg/dL — ABNORMAL HIGH (ref 70–99)
Glucose-Capillary: 185 mg/dL — ABNORMAL HIGH (ref 70–99)
Glucose-Capillary: 138 mg/dL — ABNORMAL HIGH (ref 70–99)

## 2024-03-25 LAB — BASIC METABOLIC PANEL WITH GFR
Anion gap: 11 (ref 5–15)
BUN: 19 mg/dL (ref 8–23)
CO2: 23 mmol/L (ref 22–32)
Calcium: 8.6 mg/dL — ABNORMAL LOW (ref 8.9–10.3)
Chloride: 104 mmol/L (ref 98–111)
Creatinine, Ser: 0.7 mg/dL (ref 0.61–1.24)
GFR, Estimated: >60 mL/min
Glucose, Bld: 112 mg/dL — ABNORMAL HIGH (ref 70–99)
Potassium: 4 mmol/L (ref 3.5–5.1)
Sodium: 138 mmol/L (ref 135–145)

## 2024-03-25 LAB — APTT: aPTT: 91 s — ABNORMAL HIGH (ref 24–36)

## 2024-03-25 LAB — CBC
HCT: 44.7 % (ref 39.0–52.0)
Hemoglobin: 15.4 g/dL (ref 13.0–17.0)
MCH: 33.5 pg (ref 26.0–34.0)
MCHC: 34.5 g/dL (ref 30.0–36.0)
MCV: 97.2 fL (ref 80.0–100.0)
Platelets: 131 K/uL — ABNORMAL LOW (ref 150–400)
RBC: 4.6 MIL/uL (ref 4.22–5.81)
RDW: 12.9 % (ref 11.5–15.5)
WBC: 6.7 K/uL (ref 4.0–10.5)
nRBC: 0 % (ref 0.0–0.2)

## 2024-03-25 LAB — HEPARIN LEVEL (UNFRACTIONATED): Heparin Unfractionated: 0.63 [IU]/mL (ref 0.30–0.70)

## 2024-03-25 NOTE — Progress Notes (Signed)
 Brief Progress Note  No issues overnight Denies chest pain Ambulating independently without issues  Vitals:   03/25/24 0834 03/25/24 1204  BP: 134/76 124/77  Pulse: 66 65  Resp: 20 18  Temp: 98.6 F (37 C) 97.9 F (36.6 C)  SpO2: 96% 100%   Plan:  OR Tuesday for CABG.  Encouraged to walk as much as possible to stay strong prior to surgery - Continue heparin  gtt  Con Clunes, MD Cardiothoracic Surgery Pager: [phone]

## 2024-03-25 NOTE — Plan of Care (Signed)
   Problem: Metabolic: Goal: Ability to maintain appropriate glucose levels will improve Outcome: Progressing

## 2024-03-25 NOTE — Progress Notes (Signed)
 " Cardiology Consultation:   Patient ID: Randy Evans MRN: [id number]; DOB: Jan 09, 1941  Admit date: 03/23/2024 Date of Consult: 03/25/2024  Primary Care Provider: Regino Slater, MD [facility] Cardiologist: Lonni LITTIE Nanas, MD  [facility] Electrophysiologist:  Danelle Birmingham, MD   Patient Profile:   Randy Evans is a 83 y.o. male with a AF s/p multiple ablations, CAD with MVD, cerebral aneurysm s/p coil embolization, hemochromatosis, gout, DM2, and HTN who is admitted for CP and is here for expedited CABG evaluation.   History of Present Illness:   Chest pain-free over the past 24 hours and since admission.  Continues on heparin  gtt. and Nitropatch without any recurrent chest discomfort.  Last dose Eliquis  12/19/AM.  No changes today.  Pending CABG Monday-Tuesday.  Past Medical History:  Diagnosis Date   Atrial fibrillation (HCC)    Onset 1991 in [place]    Cerebral aneurysm without rupture    treated with coil embolization 2006   Gout    Hemochromatosis    Has seen Norleen Hint   Hypertension    Past Surgical History:  Procedure Laterality Date   ANEURYSM COILING  2006   bone spur removal  1951   below left knee   BRAIN SURGERY     CARDIAC ELECTROPHYSIOLOGY MAPPING AND ABLATION  01/2008; 07/2008   CARDIOVERSION  2000; 2008 03/2008; ;02/15/2011   2008; 2009 2012:    Surgeon: LELON Jacques Blanca Mickey., MD;  Location: [facility] OR;  Service: Cardiovascular;  Laterality: N/A;   CARDIOVERSION  08/05/2011   Procedure: CARDIOVERSION;  Surgeon: Elsie GORMAN Blanca, MD;  Location: [facility] OR;  Service: Cardiovascular;  Laterality: N/A;   CATARACT EXTRACTION W/ INTRAOCULAR LENS  IMPLANT, BILATERAL  2009-2010   IR ANGIO INTRA EXTRACRAN SEL COM CAROTID INNOMINATE UNI L MOD SED  11/09/2016   IR ANGIO INTRA EXTRACRAN SEL COM CAROTID INNOMINATE UNI R MOD SED  07/17/2019   IR ANGIO INTRA EXTRACRAN SEL INTERNAL CAROTID UNI R MOD SED  11/09/2016   IR US  GUIDE VASC ACCESS RIGHT  07/17/2019   LEFT HEART  CATH AND CORONARY ANGIOGRAPHY N/A 03/20/2024   Procedure: LEFT HEART CATH AND CORONARY ANGIOGRAPHY;  Surgeon: Elmira Newman JINNY, MD;  Location: [facility] INVASIVE CV LAB;  Service: Cardiovascular;  Laterality: N/A;   LIVER BIOPSY  late 1990's   TOE SURGERY     Inpatient Medications: Scheduled Meds:  allopurinol   300 mg Oral Daily   amLODipine   10 mg Oral Daily   aspirin  EC  81 mg Oral Daily   atorvastatin   10 mg Oral Daily   carvedilol   6.25 mg Oral BID   insulin  aspart  0-15 Units Subcutaneous TID WC   irbesartan   300 mg Oral Daily   nitroGLYCERIN   1 inch Topical Q6H   Continuous Infusions:  heparin  1,550 Units/hr (03/25/24 0414)   PRN Meds: acetaminophen , nitroGLYCERIN , ondansetron  (ZOFRAN ) IV  Allergies:   Allergies[1]  Social History:   Social History   Socioeconomic History   Marital status: Married    Spouse name: Not on file   Number of children: Not on file   Years of education: Not on file   Highest education level: Not on file  Occupational History   Not on file  Tobacco Use   Smoking status: Former    Types: Cigarettes, Pipe, Cigars   Smokeless tobacco: Never   Tobacco comments:    quit smoking 1966  Substance and Sexual Activity   Alcohol use: Yes    Alcohol/week: 2.0  standard drinks of alcohol    Types: 2 Glasses of wine per week    Comment: SOCIAL   Drug use: No   Sexual activity: Yes    Partners: Female    Comment: MARRIED  Other Topics Concern   Not on file  Social History Narrative   Retired from [employer].  Moved from [place] . Graduate of [employer].  Married.   Social Drivers of Health   Tobacco Use: Medium Risk (03/23/2024)   Patient History    Smoking Tobacco Use: Former    Smokeless Tobacco Use: Never    Passive Exposure: Not on Actuary Strain: Not on file  Food Insecurity: No Food Insecurity (03/24/2024)   Epic    Worried About Programme Researcher, Broadcasting/film/video in the Last Year: Never true    Ran Out of Food in the Last Year: Never true   Transportation Needs: No Transportation Needs (03/24/2024)   Epic    Lack of Transportation (Medical): No    Lack of Transportation (Non-Medical): No  Physical Activity: Not on file  Stress: Not on file  Social Connections: Socially Integrated (03/24/2024)   Social Connection and Isolation Panel    Frequency of Communication with Friends and Family: More than three times a week    Frequency of Social Gatherings with Friends and Family: More than three times a week    Attends Religious Services: More than 4 times per year    Active Member of [employer] or Organizations: Yes    Attends Banker Meetings: More than 4 times per year    Marital Status: Married  Catering Manager Violence: Not At Risk (03/24/2024)   Epic    Fear of Current or Ex-Partner: No    Emotionally Abused: No    Physically Abused: No    Sexually Abused: No  Depression (PHQ2-9): Low Risk (02/01/2023)   Depression (PHQ2-9)    PHQ-2 Score: 0  Alcohol Screen: Not on file  Housing: Low Risk (03/24/2024)   Epic    Unable to Pay for Housing in the Last Year: No    Number of Times Moved in the Last Year: 0    Homeless in the Last Year: No  Utilities: Not At Risk (03/24/2024)   Epic    Threatened with loss of utilities: No  Health Literacy: Not on file    Family History:   History reviewed. No pertinent family history.   ROS:  Review of Systems: [y] = yes, [ ]  = no      General: Weight gain [ ] ; Weight loss [ ] ; Anorexia [ ] ; Fatigue [ ] ; Fever [ ] ; Chills [ ] ; Weakness [ ]    Cardiac: Chest pain/pressure [ ] ; Resting SOB [ ] ; Exertional SOB [ ] ; Orthopnea [ ] ; Pedal Edema [ ] ; Palpitations [ ] ; Syncope [ ] ; Presyncope [ ] ; Paroxysmal nocturnal dyspnea [ ]    Pulmonary: Cough [ ] ; Wheezing [ ] ; Hemoptysis [ ] ; Sputum [ ] ; Snoring [ ]    GI: Vomiting [ ] ; Dysphagia [ ] ; Melena [ ] ; Hematochezia [ ] ; Heartburn [ ] ; Abdominal pain [ ] ; Constipation [ ] ; Diarrhea [ ] ; BRBPR [ ]    GU: Hematuria [ ] ; Dysuria [ ] ;  Nocturia [ ]  Vascular: Pain in legs with walking [ ] ; Pain in feet with lying flat [ ] ; Non-healing sores [ ] ; Stroke [ ] ; TIA [ ] ; Slurred speech [ ] ;   Neuro: Headaches [ ] ; Vertigo [ ] ; Seizures [ ] ; Paresthesias [ ] ;  Blurred vision [ ] ; Diplopia [ ] ; Vision changes [ ]    Ortho/Skin: Arthritis [ ] ; Joint pain [ ] ; Muscle pain [ ] ; Joint swelling [ ] ; Back Pain [ ] ; Rash [ ]    Psych: Depression [ ] ; Anxiety [ ]    Heme: Bleeding problems [ ] ; Clotting disorders [ ] ; Anemia [ ]    Endocrine: Diabetes [ ] ; Thyroid  dysfunction [ ]    Physical Exam/Data:   Vitals:   03/25/24 0006 03/25/24 0410 03/25/24 0418 03/25/24 0834  BP: 118/72   134/76  Pulse:    66  Resp: 20   20  Temp: 98.6 F (37 C) 98.2 F (36.8 C)  98.6 F (37 C)  TempSrc: Oral Oral  Oral  SpO2:    96%  Weight:   85.2 kg   Height:       Intake/Output Summary (Last 24 hours) at 03/25/2024 0911 Last data filed at 03/24/2024 2359 Gross per 24 hour  Intake 650.76 ml  Output --  Net 650.76 ml      03/25/2024    4:18 AM 03/24/2024   12:48 PM 03/20/2024    5:49 AM  Last 3 Weights  Weight (lbs) 187 lb 13.3 oz 190 lb 0.6 oz 190 lb  Weight (kg) 85.2 kg 86.2 kg 86.183 kg     Body mass index is 27.74 kg/m.  General:  Well nourished, well developed, in no acute distress HEENT: normal Cardiac:  normal S1, S2; RRR; no murmur  Lungs: clear to auscultation bilaterally, no wheezing, rhonchi or rales  Ext: no edema Musculoskeletal:  No deformities, BUE and BLE strength normal and equal Skin: warm and dry  Neuro:  CNs 2-12 intact, no focal abnormalities noted Psych:  Normal affect    ECG review 03/24/24: NSR 67, PR 256, QRS 159, QT/c 492/520, 1' AVB, RBBB/LAFB, isolated PVCs 03/23/24: NSR 60, PR 236, QRS 146, QT/c 486/486, 1' AVB, RBBB/LAFB 03/09/24: NSR 62, PR 236, QRS 144, QT/c 460/466, 1' AVB, RBBB   Telemetry: Telemetry was personally reviewed and demonstrates:  NSR 50-70s   Relevant CV Studies: TTE Result date:  03/20/24  1. Left ventricular ejection fraction, by estimation, is 55 to 60%. Left  ventricular ejection fraction by 3D volume is 65 %. The left ventricle has  normal function. The left ventricle has no regional wall motion  abnormalities. There is mild left  ventricular hypertrophy. Left ventricular diastolic parameters are  consistent with Grade III diastolic dysfunction (restrictive). Elevated  left atrial pressure.   2. Right ventricular systolic function is low normal. The right  ventricular size is normal. There is moderately elevated pulmonary artery  systolic pressure. The estimated right ventricular systolic pressure is  56.9 mmHg.   3. Left atrial size was moderately dilated.   4. Right atrial size was mildly dilated.   5. The mitral valve is normal in structure. Trivial mitral valve  regurgitation. No evidence of mitral stenosis.   6. The aortic valve is tricuspid. Aortic valve regurgitation is trivial.  No aortic stenosis is present.   7. Aortic dilatation noted. There is dilatation of the ascending aorta,  measuring 44 mm.   Laboratory Data:  Chemistry Recent Labs  Lab 03/23/24 1118 03/24/24 0238 03/25/24 0336  NA 136 136 138  K 4.5 4.3 4.0  CL 103 103 104  CO2 23 22 23   GLUCOSE 204* 108* 112*  BUN 20 20 19   CREATININE 0.72 0.70 0.70  CALCIUM  9.1 8.4* 8.6*  GFRNONAA >60 >60 >60  ANIONGAP 10 11 11     Hematology Recent Labs  Lab 03/23/24 1118 03/24/24 0238 03/25/24 0336  WBC 9.0 8.2 6.7  RBC 5.01 4.56 4.60  HGB 16.9 15.7 15.4  HCT 50.1 45.6 44.7  MCV 100.0 100.0 97.2  MCH 33.7 34.4* 33.5  MCHC 33.7 34.4 34.5  RDW 13.2 13.1 12.9  PLT 154 129* 131*   Radiology/Studies:  DG Chest 1 View Result Date: 03/23/2024 CLINICAL DATA:  Chest pain. EXAM: CHEST  1 VIEW COMPARISON:  January 25, 2014 FINDINGS: The heart size and mediastinal contours are within normal limits. Both lungs are clear. Multilevel degenerative changes are seen throughout the thoracic  spine. IMPRESSION: No active disease. Electronically Signed   By: Suzen Dials M.D.   On: 03/23/2024 13:06   Assessment and Plan:  Randy Evans is a 83 y.o. male with a AF s/p multiple ablations, CAD with MVD, cerebral aneurysm s/p coil embolization, hemochromatosis, gout, DM2, and HTN who is admitted for CP and is here for expedited CABG evaluation.   Chest pain CAD -03/2024 LHC MVD.  EF normal, G3 DD.  RVSP 56.9. Initial plan for outpatient CABG.  Due to accelerating and progressive complaints of worsening chest pain he will be admitted to the hospital for inpatient CABG evaluation.  EKG with no acute ST-T wave changes, hsT flat (22->19) CTS consulted and planning tentatively Tuesday for CABG  Continue IV heparin , aspirin , continue carvedilol  6.25 mg twice daily. Has nitroglycerin  patch. No active chest pain. Lpa pending, LDL 35   PAF - History of multiple ablations.  Last at [facility] per his report. Normal maintained sinus rhythm and in sinus rhythm today. Last dose of Eliquis  was 12/19 AM. Continue IV heparin .  Continue carvedilol  6.25 mg twice daily.   Hemochromatosis 01/2023 cardiac MRI with no evidence of myocardial iron overload.  Stable disease. Follows hematology outpatient.  No longer requires routine phlebotomy.   Diabetes SSI here. A1c 6.8   Lower extremity edema Euvolemic otherwise    Hypertension Blood pressure well-controlled.   Continue amlodipine  10 mg and carvedilol  6.25 mg and irbesartan  300 mg (dont have valsartant).   Aortic aneurysm CTA chest 12/2023 showed ascending aortic dilatation measuring 42 mm. Defer management to CT surgery.    Bifascicular block Monitor for bradycardia postoperatively.   Risk Assessment/Risk Scores:   CHA2DS2-VASc Score = 5  This indicates a 7.2% annual risk of stroke. The patient's score is based upon: CHF History: 0 HTN History: 1 Diabetes History: 1 Stroke History: 0 Vascular Disease History: 1 Age Score:  2 Gender Score: 0   Code Status: Full Code  Signed, Donnice DELENA Primus, MD  03/25/2024 9:11 AM     [1]  Allergies Allergen Reactions   Codeine Itching   Quinolones Other (See Comments)    Ascending thoracic aortic aneurysm    Sulfa Antibiotics Other (See Comments)    Reactions: nausea, shaking of the body   Wheat Rash   "

## 2024-03-25 NOTE — Plan of Care (Signed)
   Problem: Health Behavior/Discharge Planning: Goal: Ability to manage health-related needs will improve Outcome: Progressing

## 2024-03-25 NOTE — Plan of Care (Signed)
  Problem: Education: Goal: Understanding of cardiac disease, CV risk reduction, and recovery process will improve Outcome: Progressing Goal: Individualized Educational Video(s) Outcome: Progressing   Problem: Activity: Goal: Ability to tolerate increased activity will improve Outcome: Progressing   Problem: Cardiac: Goal: Ability to achieve and maintain adequate cardiovascular perfusion will improve Outcome: Progressing   

## 2024-03-25 NOTE — Progress Notes (Signed)
 ANTICOAGULATION CONSULT NOTE  Pharmacy Consult for Heparin  Indication: chest pain/ACS and atrial fibrillation  Allergies[1]  Patient Measurements: Height: 5' 9 (175.3 cm) Weight: 85.2 kg (187 lb 13.3 oz) IBW/kg (Calculated) : 70.7 Heparin  Dosing Weight: 86.2 kg  Vital Signs: Temp: 98.6 F (37 C) (12/21 0834) Temp Source: Oral (12/21 0834) BP: 134/76 (12/21 0834) Pulse Rate: 66 (12/21 0834)  Labs: Recent Labs    03/23/24 1118 03/23/24 1118 03/24/24 0238 03/24/24 1100 03/24/24 2035 03/25/24 0336  HGB 16.9  --  15.7  --   --  15.4  HCT 50.1  --  45.6  --   --  44.7  PLT 154  --  129*  --   --  131*  APTT  --    < > 48* 61* 72* 91*  HEPARINUNFRC  --   --  0.95*  --   --  0.63  CREATININE 0.72  --  0.70  --   --  0.70   < > = values in this interval not displayed.    Estimated Creatinine Clearance: 78.4 mL/min (by C-G formula based on SCr of 0.7 mg/dL).   Medical History: Past Medical History:  Diagnosis Date   Atrial fibrillation (HCC)    Onset 1991 in [place]    Cerebral aneurysm without rupture    treated with coil embolization 2006   Gout    Hemochromatosis    Has seen Norleen Hint   Hypertension     Medications:  Medications Prior to Admission  Medication Sig Dispense Refill Last Dose/Taking   acetaminophen  (TYLENOL ) 500 MG tablet Take 1,000 mg by mouth daily as needed for moderate pain (pain score 4-6) or mild pain (pain score 1-3).   03/23/2024   allopurinol  (ZYLOPRIM ) 300 MG tablet Take 300 mg by mouth daily.     03/23/2024   amLODipine  (NORVASC ) 10 MG tablet TAKE 1 TABLET DAILY 90 tablet 3 03/23/2024   apixaban  (ELIQUIS ) 5 MG TABS tablet TAKE 1 TABLET TWICE A DAY 180 tablet 2 03/23/2024 at  6:30 AM   Ascorbic Acid (VITAMIN C PO) Take 1 tablet by mouth 4 (four) times a week.   03/22/2024   atorvastatin  (LIPITOR) 10 MG tablet TAKE 1 TABLET BY MOUTH EVERY DAY 90 tablet 2 03/23/2024   carvedilol  (COREG ) 6.25 MG tablet Take 1 tablet (6.25 mg total) by mouth 2  (two) times daily. 180 tablet 3 03/23/2024   clobetasol cream (TEMOVATE) 0.05 % Apply 1 Application topically daily as needed (irritation).   Unknown   FARXIGA 5 MG TABS tablet Take 5 mg by mouth at bedtime.   03/22/2024   ketoconazole (NIZORAL) 2 % cream Apply 1 Application topically daily as needed for irritation.   Unknown   Multiple Vitamins-Minerals (ONE A DAY MEN 50 PLUS) TABS Take 1 tablet by mouth 4 (four) times a week.   03/22/2024   nitroGLYCERIN  (NITROSTAT ) 0.4 MG SL tablet Place 1 tablet (0.4 mg total) under the tongue every 5 (five) minutes as needed for chest pain. 25 tablet 1 03/23/2024   Omega-3 Fatty Acids (FISH OIL PO) Take 1 capsule by mouth daily.   03/22/2024   valsartan (DIOVAN) 320 MG tablet Take 320 mg by mouth daily.   03/23/2024   Scheduled:   allopurinol   300 mg Oral Daily   amLODipine   10 mg Oral Daily   aspirin  EC  81 mg Oral Daily   atorvastatin   10 mg Oral Daily   carvedilol   6.25 mg Oral BID  insulin  aspart  0-15 Units Subcutaneous TID WC   irbesartan   300 mg Oral Daily   nitroGLYCERIN   1 inch Topical Q6H   Infusions:   heparin  1,550 Units/hr (03/25/24 0414)   PRN: acetaminophen , nitroGLYCERIN , ondansetron  (ZOFRAN ) IV  Assessment: 83 y.o. M presents with CP. Noted s/p recent cath 12/16 which showed severe multivessel CAD. Pt on Eliquis  pta for afib. Last dose 12/19 am @ 0630. To hold apixaban  and CVTS to continue evaluation for CABG.  aPTT 91, heparin  level 0.63 (both therapeutic) on heparin  1550 units/hr Will convert to heparin  level monitoring as aPTT and heparin  level are now correlating Hgb stable (15.4), PLT low, stable (131) No bleeding or issues reported  Goal of Therapy:  Heparin  level 0.3-0.7 units/ml aPTT 66-102 seconds Monitor platelets by anticoagulation protocol: Yes   Plan:  Continue heparin  infusion at 1550 units/hr  Daily heparin  level while on heparin  Continue to monitor H&H and platelets F/u CVTS recs for CABG (tentative  Tuesday, 12/23)  Izetta Carl, PharmD PGY1 Pharmacy Resident           [1]  Allergies Allergen Reactions   Codeine Itching   Quinolones Other (See Comments)    Ascending thoracic aortic aneurysm    Sulfa Antibiotics Other (See Comments)    Reactions: nausea, shaking of the body   Wheat Rash

## 2024-03-26 ENCOUNTER — Telehealth: Payer: Self-pay | Admitting: *Deleted

## 2024-03-26 ENCOUNTER — Ambulatory Visit: Admitting: Surgery

## 2024-03-26 ENCOUNTER — Inpatient Hospital Stay (HOSPITAL_COMMUNITY)

## 2024-03-26 DIAGNOSIS — Z01810 Encounter for preprocedural cardiovascular examination: Secondary | ICD-10-CM | POA: Diagnosis not present

## 2024-03-26 DIAGNOSIS — I2583 Coronary atherosclerosis due to lipid rich plaque: Secondary | ICD-10-CM

## 2024-03-26 DIAGNOSIS — I2510 Atherosclerotic heart disease of native coronary artery without angina pectoris: Secondary | ICD-10-CM | POA: Diagnosis not present

## 2024-03-26 LAB — CBC
HCT: 45.7 % (ref 39.0–52.0)
Hemoglobin: 15.7 g/dL (ref 13.0–17.0)
MCH: 34.1 pg — ABNORMAL HIGH (ref 26.0–34.0)
MCHC: 34.4 g/dL (ref 30.0–36.0)
MCV: 99.1 fL (ref 80.0–100.0)
Platelets: 135 K/uL — ABNORMAL LOW (ref 150–400)
RBC: 4.61 MIL/uL (ref 4.22–5.81)
RDW: 12.9 % (ref 11.5–15.5)
WBC: 6.6 K/uL (ref 4.0–10.5)
nRBC: 0 % (ref 0.0–0.2)

## 2024-03-26 LAB — BASIC METABOLIC PANEL WITH GFR
Anion gap: 11 (ref 5–15)
BUN: 24 mg/dL — ABNORMAL HIGH (ref 8–23)
CO2: 22 mmol/L (ref 22–32)
Calcium: 8.7 mg/dL — ABNORMAL LOW (ref 8.9–10.3)
Chloride: 103 mmol/L (ref 98–111)
Creatinine, Ser: 0.76 mg/dL (ref 0.61–1.24)
GFR, Estimated: >60 mL/min
Glucose, Bld: 142 mg/dL — ABNORMAL HIGH (ref 70–99)
Potassium: 4 mmol/L (ref 3.5–5.1)
Sodium: 136 mmol/L (ref 135–145)

## 2024-03-26 LAB — GLUCOSE, CAPILLARY
Glucose-Capillary: 170 mg/dL — ABNORMAL HIGH (ref 70–99)
Glucose-Capillary: 125 mg/dL — ABNORMAL HIGH (ref 70–99)
Glucose-Capillary: 118 mg/dL — ABNORMAL HIGH (ref 70–99)
Glucose-Capillary: 166 mg/dL — ABNORMAL HIGH (ref 70–99)

## 2024-03-26 LAB — VAS US DOPPLER PRE CABG

## 2024-03-26 LAB — TYPE AND SCREEN
ABO/RH(D): O POS
Antibody Screen: NEGATIVE

## 2024-03-26 LAB — LIPOPROTEIN A (LPA): Lipoprotein (a): 31.7 nmol/L — ABNORMAL HIGH

## 2024-03-26 LAB — HEPARIN LEVEL (UNFRACTIONATED): Heparin Unfractionated: 0.45 [IU]/mL (ref 0.30–0.70)

## 2024-03-26 MED ORDER — HEPARIN 30,000 UNITS/1000 ML (OHS) CELLSAVER SOLUTION
Status: DC
  Filled 2024-03-26: qty 1000

## 2024-03-26 MED ORDER — NOREPINEPHRINE 4 MG/250ML-% IV SOLN
0.0000 ug/min | INTRAVENOUS | Status: DC
  Filled 2024-03-26: qty 250

## 2024-03-26 MED ORDER — CEFAZOLIN SODIUM-DEXTROSE 2-4 GM/100ML-% IV SOLN
2.0000 g | INTRAVENOUS | Status: AC
  Administered 2024-03-27 (×2): 2 g via INTRAVENOUS
  Filled 2024-03-26: qty 100

## 2024-03-26 MED ORDER — NITROGLYCERIN IN D5W 200-5 MCG/ML-% IV SOLN
2.0000 ug/min | INTRAVENOUS | Status: DC
  Filled 2024-03-26: qty 250

## 2024-03-26 MED ORDER — METOPROLOL TARTRATE 12.5 MG HALF TABLET
12.5000 mg | ORAL_TABLET | Freq: Once | ORAL | Status: AC
  Administered 2024-03-27: 12.5 mg via ORAL
  Filled 2024-03-26: qty 1

## 2024-03-26 MED ORDER — VANCOMYCIN HCL 1.5 G IV SOLR
1500.0000 mg | INTRAVENOUS | Status: AC
  Administered 2024-03-27: 1500 mg via INTRAVENOUS
  Filled 2024-03-26: qty 30

## 2024-03-26 MED ORDER — TEMAZEPAM 7.5 MG PO CAPS: 15.0000 mg | ORAL_CAPSULE | Freq: Once | ORAL | Status: DC | PRN

## 2024-03-26 MED ORDER — PLASMA-LYTE A IV SOLN
INTRAVENOUS | Status: DC
  Filled 2024-03-26: qty 2.5

## 2024-03-26 MED ORDER — TRANEXAMIC ACID (OHS) BOLUS VIA INFUSION
15.0000 mg/kg | INTRAVENOUS | Status: AC
  Administered 2024-03-27: 1288.5 mg via INTRAVENOUS
  Filled 2024-03-26: qty 1289

## 2024-03-26 MED ORDER — MAGNESIUM SULFATE 50 % IJ SOLN
40.0000 meq | INTRAMUSCULAR | Status: DC
  Filled 2024-03-26: qty 9.85

## 2024-03-26 MED ORDER — CHLORHEXIDINE GLUCONATE 0.12 % MT SOLN
15.0000 mL | Freq: Once | OROMUCOSAL | Status: AC
  Administered 2024-03-27: 15 mL via OROMUCOSAL
  Filled 2024-03-26: qty 15

## 2024-03-26 MED ORDER — TRANEXAMIC ACID (OHS) PUMP PRIME SOLUTION
2.0000 mg/kg | INTRAVENOUS | Status: DC
  Filled 2024-03-26: qty 1.72

## 2024-03-26 MED ORDER — PHENYLEPHRINE HCL-NACL 20-0.9 MG/250ML-% IV SOLN
30.0000 ug/min | INTRAVENOUS | Status: AC
  Administered 2024-03-27: 30 ug/min via INTRAVENOUS
  Filled 2024-03-26: qty 250

## 2024-03-26 MED ORDER — BISACODYL 5 MG PO TBEC
5.0000 mg | DELAYED_RELEASE_TABLET | Freq: Once | ORAL | Status: AC
  Administered 2024-03-26: 5 mg via ORAL
  Filled 2024-03-26: qty 1

## 2024-03-26 MED ORDER — CHLORHEXIDINE GLUCONATE CLOTH 2 % EX PADS
6.0000 | MEDICATED_PAD | Freq: Once | CUTANEOUS | Status: AC
  Administered 2024-03-26: 6 via TOPICAL

## 2024-03-26 MED ORDER — DEXMEDETOMIDINE HCL IN NACL 400 MCG/100ML IV SOLN
0.1000 ug/kg/h | INTRAVENOUS | Status: AC
  Administered 2024-03-27: .3 ug/kg/h via INTRAVENOUS
  Filled 2024-03-26: qty 100

## 2024-03-26 MED ORDER — DIAZEPAM 5 MG PO TABS
5.0000 mg | ORAL_TABLET | Freq: Once | ORAL | Status: AC
  Administered 2024-03-27: 5 mg via ORAL
  Filled 2024-03-26: qty 1

## 2024-03-26 MED ORDER — MILRINONE LACTATE IN DEXTROSE 20-5 MG/100ML-% IV SOLN
0.3000 ug/kg/min | INTRAVENOUS | Status: DC
  Filled 2024-03-26: qty 100

## 2024-03-26 MED ORDER — EPINEPHRINE HCL 5 MG/250ML IV SOLN IN NS
0.0000 ug/min | INTRAVENOUS | Status: DC
  Filled 2024-03-26: qty 250

## 2024-03-26 MED ORDER — CEFAZOLIN SODIUM-DEXTROSE 2-4 GM/100ML-% IV SOLN
2.0000 g | INTRAVENOUS | Status: DC
  Filled 2024-03-26: qty 100

## 2024-03-26 MED ORDER — TRANEXAMIC ACID 1000 MG/10ML IV SOLN
1.5000 mg/kg/h | INTRAVENOUS | Status: AC
  Administered 2024-03-27: 1.5 mg/kg/h via INTRAVENOUS
  Filled 2024-03-26: qty 25

## 2024-03-26 MED ORDER — POTASSIUM CHLORIDE 2 MEQ/ML IV SOLN
80.0000 meq | INTRAVENOUS | Status: DC
  Filled 2024-03-26: qty 40

## 2024-03-26 MED ORDER — INSULIN REGULAR(HUMAN) IN NACL 100-0.9 UT/100ML-% IV SOLN
INTRAVENOUS | Status: AC
  Administered 2024-03-27: 2.2 [IU]/h via INTRAVENOUS
  Filled 2024-03-26: qty 100

## 2024-03-26 MED ORDER — CHLORHEXIDINE GLUCONATE CLOTH 2 % EX PADS: 6.0000 | MEDICATED_PAD | Freq: Once | CUTANEOUS | Status: DC

## 2024-03-26 NOTE — Plan of Care (Signed)
  Problem: Education: Goal: Understanding of cardiac disease, CV risk reduction, and recovery process will improve Outcome: Progressing Goal: Individualized Educational Video(s) Outcome: Progressing   Problem: Activity: Goal: Ability to tolerate increased activity will improve Outcome: Progressing   

## 2024-03-26 NOTE — Progress Notes (Addendum)
 "  Progress Note  Patient Name: Randy Evans Date of Encounter: 03/26/2024 [facility] Cardiologist: Randy Evans Nanas, MD   Interval Summary    No chest pain overnight, has been up walking in the hallways frequently.   Vital Signs Vitals:   03/25/24 2324 03/26/24 0249 03/26/24 0547 03/26/24 0723  BP: (!) 111/56 130/84  120/78  Pulse: 67 65  63  Resp: 19 18  16   Temp: 98.3 F (36.8 C) 98.5 F (36.9 C)  98.5 F (36.9 C)  TempSrc: Oral Oral  Oral  SpO2: 98% 97%  98%  Weight:   85.9 kg   Height:        Intake/Output Summary (Last 24 hours) at 03/26/2024 1011 Last data filed at 03/25/2024 2306 Gross per 24 hour  Intake 358.16 ml  Output --  Net 358.16 ml      03/26/2024    5:47 AM 03/25/2024    4:18 AM 03/24/2024   12:48 PM  Last 3 Weights  Weight (lbs) 189 lb 6 oz 187 lb 13.3 oz 190 lb 0.6 oz  Weight (kg) 85.9 kg 85.2 kg 86.2 kg      Telemetry/ECG   Sinus rhythm - Personally Reviewed  Physical Exam  GEN: No acute distress.   Neck: No JVD Cardiac: RRR, no murmurs, rubs, or gallops.  Respiratory: Clear to auscultation bilaterally. GI: Soft, nontender, non-distended  MS: No edema  Assessment & Plan   83 y.o. male with a AF s/p multiple ablations, CAD with MVD, cerebral aneurysm s/p coil embolization, hemochromatosis, gout, DM2, and HTN who was admitted for CP and expedited CABG evaluation.   Chest pain CAD -- underwent cardiac cath 12/16 with multivessel CAD, initially planned for outpatient CABG evaluation but developed accelerating chest pain and presented to the ED -- seen by CT surgery, plans for CABG tomorrow  -- continue IV heparin , aspirin , continue carvedilol  6.25 mg twice daily, nitroglycerin  paste  Paroxsymal atrial fibrillation -- s/p prior ablations, last in 2020 at [facility] -- maintaining sinus rhythm -- on IV heparin , and coreg    Hemochromatosis -- 01/2023 cardiac MRI with no evidence of myocardial iron overload.  Stable  disease. Follows hematology outpatient.   -- No longer requires routine phlebotomy.   Diabetes -- SSI while inpatient -- Hgb A1c 6.8   Hypertension -- well controlled   -- Continue amlodipine  10 mg and carvedilol  6.25 mg. Stop ARB with plans for CABG, likely resume afterwards    Aortic aneurysm -- CTA chest 12/2023 showed ascending aortic dilatation measuring 42 mm.     For questions or updates, please contact [facility] Please consult [email or web address] for contact info under   Signed, Manuelita Rummer, NP   Agree with note by Manuelita Rummer NP-C  Patient had left heart cath last week revealing distal left main and proximal LAD disease.  He has surgical anatomy.  Other problems as outlined including PAF status post prior ablation at [facility] maintaining sinus rhythm.  He is on heparin  and Coreg .  He has a small stable ascending thoracic aortic aneurysm measuring 42 mm which Dr. Lucas follows he recently was going to have outpatient CABG but because of recurrent chest pain he was admitted for observation.  He is currently on a nitroglycerin  patch and IV heparin .  Anticipate CABG tomorrow.  Patient otherwise stable.  [name], M.D., FACP, [facility], LYNITA [facility] [address] [place], [place]  [address]  [phone] 03/26/2024 11:20 AM  "

## 2024-03-26 NOTE — Progress Notes (Signed)
 TCTS  Chart and cine reviewed, pt examined. He presented with unstable anginal symptoms and cath shows high grade distal LM stenosis involving the ostium of the LAD, large ramus and LCX. There is 90% proximal followed by 70% proximal to mid stenosis in the LAD. Echo shows normal LV systolic function with no significant valvular disease. I agree with need for CABG. I discussed the operative procedure with the patient including alternatives, benefits and risks; including but not limited to bleeding, blood transfusion, infection, stroke, myocardial infarction, graft failure, heart block requiring a permanent pacemaker, organ dysfunction, and death.  Randy Evans understands and agrees to proceed.  We will schedule surgery for tomorrow morning.  [name], MD

## 2024-03-26 NOTE — Progress Notes (Signed)
 ANTICOAGULATION CONSULT NOTE  Pharmacy Consult for Heparin  Indication: chest pain/ACS and atrial fibrillation  Allergies[1]  Patient Measurements: Height: 5' 9 (175.3 cm) Weight: 85.9 kg (189 lb 6 oz) IBW/kg (Calculated) : 70.7 Heparin  Dosing Weight: 86.2 kg  Vital Signs: Temp: 98.5 F (36.9 C) (12/22 0723) Temp Source: Oral (12/22 0723) BP: 120/78 (12/22 0723) Pulse Rate: 63 (12/22 0723)  Labs: Recent Labs    03/24/24 0238 03/24/24 1100 03/24/24 2035 03/25/24 0336 03/26/24 0342  HGB 15.7  --   --  15.4 15.7  HCT 45.6  --   --  44.7 45.7  PLT 129*  --   --  131* 135*  APTT 48* 61* 72* 91*  --   HEPARINUNFRC 0.95*  --   --  0.63 0.45  CREATININE 0.70  --   --  0.70 0.76    Estimated Creatinine Clearance: 78.7 mL/min (by C-G formula based on SCr of 0.76 mg/dL).   Medical History: Past Medical History:  Diagnosis Date   Atrial fibrillation (HCC)    Onset 1991 in [place]    Cerebral aneurysm without rupture    treated with coil embolization 2006   Gout    Hemochromatosis    Has seen Norleen Hint   Hypertension     Medications:  Medications Prior to Admission  Medication Sig Dispense Refill Last Dose/Taking   acetaminophen  (TYLENOL ) 500 MG tablet Take 1,000 mg by mouth daily as needed for moderate pain (pain score 4-6) or mild pain (pain score 1-3).   03/23/2024   allopurinol  (ZYLOPRIM ) 300 MG tablet Take 300 mg by mouth daily.     03/23/2024   amLODipine  (NORVASC ) 10 MG tablet TAKE 1 TABLET DAILY 90 tablet 3 03/23/2024   apixaban  (ELIQUIS ) 5 MG TABS tablet TAKE 1 TABLET TWICE A DAY 180 tablet 2 03/23/2024 at  6:30 AM   Ascorbic Acid (VITAMIN C PO) Take 1 tablet by mouth 4 (four) times a week.   03/22/2024   atorvastatin  (LIPITOR) 10 MG tablet TAKE 1 TABLET BY MOUTH EVERY DAY 90 tablet 2 03/23/2024   carvedilol  (COREG ) 6.25 MG tablet Take 1 tablet (6.25 mg total) by mouth 2 (two) times daily. 180 tablet 3 03/23/2024   clobetasol cream (TEMOVATE) 0.05 % Apply 1  Application topically daily as needed (irritation).   Unknown   FARXIGA 5 MG TABS tablet Take 5 mg by mouth at bedtime.   03/22/2024   ketoconazole (NIZORAL) 2 % cream Apply 1 Application topically daily as needed for irritation.   Unknown   Multiple Vitamins-Minerals (ONE A DAY MEN 50 PLUS) TABS Take 1 tablet by mouth 4 (four) times a week.   03/22/2024   nitroGLYCERIN  (NITROSTAT ) 0.4 MG SL tablet Place 1 tablet (0.4 mg total) under the tongue every 5 (five) minutes as needed for chest pain. 25 tablet 1 03/23/2024   Omega-3 Fatty Acids (FISH OIL PO) Take 1 capsule by mouth daily.   03/22/2024   valsartan (DIOVAN) 320 MG tablet Take 320 mg by mouth daily.   03/23/2024   Scheduled:   allopurinol   300 mg Oral Daily   amLODipine   10 mg Oral Daily   aspirin  EC  81 mg Oral Daily   atorvastatin   10 mg Oral Daily   carvedilol   6.25 mg Oral BID   insulin  aspart  0-15 Units Subcutaneous TID WC   irbesartan   300 mg Oral Daily   nitroGLYCERIN   1 inch Topical Q6H   Infusions:   heparin  1,550 Units/hr (03/25/24 1927)  PRN: acetaminophen , nitroGLYCERIN , ondansetron  (ZOFRAN ) IV  Assessment: 83 y.o. M presents with CP. Noted s/p recent cath 12/16 which showed severe multivessel CAD. Pt on Eliquis  pta for afib. Last dose 12/19 am @ 0630. To hold apixaban  and CVTS to continue evaluation for CABG.  Heparin  level therapeutic, CBC stable.  Goal of Therapy:  Heparin  level 0.3-0.7 units/ml aPTT 66-102 seconds Monitor platelets by anticoagulation protocol: Yes   Plan:  Continue heparin  infusion at 1550 units/hr  Daily heparin  level while on heparin   Ozell Jamaica, PharmD, BCPS, [facility] Clinical Pharmacist [phone] Please check AMION for all [facility] numbers 03/26/2024            [1]  Allergies Allergen Reactions   Codeine Itching   Quinolones Other (See Comments)    Ascending thoracic aortic aneurysm    Sulfa Antibiotics Other (See Comments)    Reactions: nausea, shaking of the  body   Wheat Rash

## 2024-03-26 NOTE — Telephone Encounter (Signed)
 Called and gave patient results. Per patient he would like to make Dr. Kate aware he is in hospital  and having heart surgery 12/23. Made patient aware that this will be forward to Dr. Kate to make him aware.

## 2024-03-26 NOTE — H&P (View-Only) (Signed)
 TCTS  Chart and cine reviewed, pt examined. He presented with unstable anginal symptoms and cath shows high grade distal LM stenosis involving the ostium of the LAD, large ramus and LCX. There is 90% proximal followed by 70% proximal to mid stenosis in the LAD. Echo shows normal LV systolic function with no significant valvular disease. I agree with need for CABG. I discussed the operative procedure with the patient including alternatives, benefits and risks; including but not limited to bleeding, blood transfusion, infection, stroke, myocardial infarction, graft failure, heart block requiring a permanent pacemaker, organ dysfunction, and death.  Randy Evans understands and agrees to proceed.  We will schedule surgery for tomorrow morning.  [name], MD

## 2024-03-26 NOTE — Progress Notes (Signed)
 CARDIAC REHAB PHASE I      CARDIAC REHAB PHASE I      Pre-op OHS education including OHS booklet, OHS handout, IS use, mobility importance, home needs at discharge and sternal precautions/move in the tube reviewed. Preop IS 2250. Patient independently ambulating and motivated to pursue CRP2. All questions and concerns addressed. Will continue to follow.   [id number] Randy JAYSON Liverpool, RN BSN 03/26/2024 11:11 AM

## 2024-03-26 NOTE — Progress Notes (Signed)
 Pre-CABG doppler has been completed.  Results can be found in chart review under CV Proc.  03/26/2024 6:29 PM  Edilia Elden Appl, RVT.

## 2024-03-27 ENCOUNTER — Inpatient Hospital Stay (HOSPITAL_COMMUNITY)
Admission: EM | Disposition: A | Discharge status: Home / Self Care | Payer: Self-pay | Source: Home / Self Care | Attending: Surgery

## 2024-03-27 ENCOUNTER — Inpatient Hospital Stay (HOSPITAL_COMMUNITY): Payer: Self-pay

## 2024-03-27 ENCOUNTER — Inpatient Hospital Stay (HOSPITAL_COMMUNITY)

## 2024-03-27 ENCOUNTER — Encounter (HOSPITAL_COMMUNITY): Payer: Self-pay | Admitting: Cardiology

## 2024-03-27 ENCOUNTER — Encounter (HOSPITAL_COMMUNITY): Payer: Self-pay

## 2024-03-27 DIAGNOSIS — I4891 Unspecified atrial fibrillation: Secondary | ICD-10-CM | POA: Diagnosis not present

## 2024-03-27 DIAGNOSIS — Z87891 Personal history of nicotine dependence: Secondary | ICD-10-CM | POA: Diagnosis not present

## 2024-03-27 DIAGNOSIS — I2510 Atherosclerotic heart disease of native coronary artery without angina pectoris: Secondary | ICD-10-CM

## 2024-03-27 DIAGNOSIS — I10 Essential (primary) hypertension: Secondary | ICD-10-CM | POA: Diagnosis not present

## 2024-03-27 DIAGNOSIS — I25119 Atherosclerotic heart disease of native coronary artery with unspecified angina pectoris: Secondary | ICD-10-CM | POA: Diagnosis not present

## 2024-03-27 DIAGNOSIS — I9789 Other postprocedural complications and disorders of the circulatory system, not elsewhere classified: Secondary | ICD-10-CM

## 2024-03-27 DIAGNOSIS — D6959 Other secondary thrombocytopenia: Secondary | ICD-10-CM

## 2024-03-27 DIAGNOSIS — I25110 Atherosclerotic heart disease of native coronary artery with unstable angina pectoris: Secondary | ICD-10-CM | POA: Diagnosis not present

## 2024-03-27 DIAGNOSIS — Z9911 Dependence on respirator [ventilator] status: Secondary | ICD-10-CM

## 2024-03-27 DIAGNOSIS — Z951 Presence of aortocoronary bypass graft: Principal | ICD-10-CM

## 2024-03-27 HISTORY — PX: INTRAOPERATIVE TRANSESOPHAGEAL ECHOCARDIOGRAM: SHX5062

## 2024-03-27 HISTORY — PX: CORONARY ARTERY BYPASS GRAFT: SHX141

## 2024-03-27 LAB — POCT I-STAT EG7
Acid-base deficit: 3 mmol/L — ABNORMAL HIGH (ref 0.0–2.0)
Bicarbonate: 22.4 mmol/L (ref 20.0–28.0)
Calcium, Ion: 1.07 mmol/L — ABNORMAL LOW (ref 1.15–1.40)
HCT: 37 % — ABNORMAL LOW (ref 39.0–52.0)
Hemoglobin: 12.6 g/dL — ABNORMAL LOW (ref 13.0–17.0)
O2 Saturation: 82 %
Potassium: 4.8 mmol/L (ref 3.5–5.1)
Sodium: 137 mmol/L (ref 135–145)
TCO2: 24 mmol/L (ref 22–32)
pCO2, Ven: 41.9 mmHg — ABNORMAL LOW (ref 44–60)
pH, Ven: 7.337 (ref 7.25–7.43)
pO2, Ven: 49 mmHg — ABNORMAL HIGH (ref 32–45)

## 2024-03-27 LAB — POCT I-STAT 7, (LYTES, BLD GAS, ICA,H+H)
Acid-Base Excess: 0 mmol/L (ref 0.0–2.0)
Acid-base deficit: 3 mmol/L — ABNORMAL HIGH (ref 0.0–2.0)
Acid-base deficit: 4 mmol/L — ABNORMAL HIGH (ref 0.0–2.0)
Acid-base deficit: 3 mmol/L — ABNORMAL HIGH (ref 0.0–2.0)
Acid-base deficit: 4 mmol/L — ABNORMAL HIGH (ref 0.0–2.0)
Acid-base deficit: 4 mmol/L — ABNORMAL HIGH (ref 0.0–2.0)
Acid-base deficit: 4 mmol/L — ABNORMAL HIGH (ref 0.0–2.0)
Bicarbonate: 22.4 mmol/L (ref 20.0–28.0)
Bicarbonate: 20.9 mmol/L (ref 20.0–28.0)
Bicarbonate: 25 mmol/L (ref 20.0–28.0)
Bicarbonate: 22 mmol/L (ref 20.0–28.0)
Bicarbonate: 23 mmol/L (ref 20.0–28.0)
Bicarbonate: 21.7 mmol/L (ref 20.0–28.0)
Bicarbonate: 21.6 mmol/L (ref 20.0–28.0)
Calcium, Ion: 1.24 mmol/L (ref 1.15–1.40)
Calcium, Ion: 1.03 mmol/L — ABNORMAL LOW (ref 1.15–1.40)
Calcium, Ion: 1.13 mmol/L — ABNORMAL LOW (ref 1.15–1.40)
Calcium, Ion: 1.15 mmol/L (ref 1.15–1.40)
Calcium, Ion: 1.16 mmol/L (ref 1.15–1.40)
Calcium, Ion: 1.19 mmol/L (ref 1.15–1.40)
Calcium, Ion: 1.2 mmol/L (ref 1.15–1.40)
HCT: 44 % (ref 39.0–52.0)
HCT: 34 % — ABNORMAL LOW (ref 39.0–52.0)
HCT: 35 % — ABNORMAL LOW (ref 39.0–52.0)
HCT: 37 % — ABNORMAL LOW (ref 39.0–52.0)
HCT: 41 % (ref 39.0–52.0)
HCT: 42 % (ref 39.0–52.0)
HCT: 44 % (ref 39.0–52.0)
Hemoglobin: 15 g/dL (ref 13.0–17.0)
Hemoglobin: 11.6 g/dL — ABNORMAL LOW (ref 13.0–17.0)
Hemoglobin: 11.9 g/dL — ABNORMAL LOW (ref 13.0–17.0)
Hemoglobin: 12.6 g/dL — ABNORMAL LOW (ref 13.0–17.0)
Hemoglobin: 13.9 g/dL (ref 13.0–17.0)
Hemoglobin: 14.3 g/dL (ref 13.0–17.0)
Hemoglobin: 15 g/dL (ref 13.0–17.0)
O2 Saturation: 100 %
O2 Saturation: 100 %
O2 Saturation: 100 %
O2 Saturation: 99 %
O2 Saturation: 98 %
O2 Saturation: 92 %
O2 Saturation: 94 %
Patient temperature: 36.6
Patient temperature: 36.6
Patient temperature: 36.8
Potassium: 4.4 mmol/L (ref 3.5–5.1)
Potassium: 5 mmol/L (ref 3.5–5.1)
Potassium: 5.3 mmol/L — ABNORMAL HIGH (ref 3.5–5.1)
Potassium: 5.3 mmol/L — ABNORMAL HIGH (ref 3.5–5.1)
Potassium: 4.2 mmol/L (ref 3.5–5.1)
Potassium: 4.4 mmol/L (ref 3.5–5.1)
Potassium: 4.4 mmol/L (ref 3.5–5.1)
Sodium: 138 mmol/L (ref 135–145)
Sodium: 136 mmol/L (ref 135–145)
Sodium: 138 mmol/L (ref 135–145)
Sodium: 137 mmol/L (ref 135–145)
Sodium: 139 mmol/L (ref 135–145)
Sodium: 140 mmol/L (ref 135–145)
Sodium: 140 mmol/L (ref 135–145)
TCO2: 24 mmol/L (ref 22–32)
TCO2: 22 mmol/L (ref 22–32)
TCO2: 26 mmol/L (ref 22–32)
TCO2: 23 mmol/L (ref 22–32)
TCO2: 24 mmol/L (ref 22–32)
TCO2: 23 mmol/L (ref 22–32)
TCO2: 23 mmol/L (ref 22–32)
pCO2 arterial: 42.1 mmHg (ref 32–48)
pCO2 arterial: 38 mmHg (ref 32–48)
pCO2 arterial: 41.6 mmHg (ref 32–48)
pCO2 arterial: 39.6 mmHg (ref 32–48)
pCO2 arterial: 45.9 mmHg (ref 32–48)
pCO2 arterial: 41.2 mmHg (ref 32–48)
pCO2 arterial: 39.9 mmHg (ref 32–48)
pH, Arterial: 7.333 — ABNORMAL LOW (ref 7.35–7.45)
pH, Arterial: 7.347 — ABNORMAL LOW (ref 7.35–7.45)
pH, Arterial: 7.388 (ref 7.35–7.45)
pH, Arterial: 7.354 (ref 7.35–7.45)
pH, Arterial: 7.305 — ABNORMAL LOW (ref 7.35–7.45)
pH, Arterial: 7.328 — ABNORMAL LOW (ref 7.35–7.45)
pH, Arterial: 7.341 — ABNORMAL LOW (ref 7.35–7.45)
pO2, Arterial: 287 mmHg — ABNORMAL HIGH (ref 83–108)
pO2, Arterial: 341 mmHg — ABNORMAL HIGH (ref 83–108)
pO2, Arterial: 325 mmHg — ABNORMAL HIGH (ref 83–108)
pO2, Arterial: 154 mmHg — ABNORMAL HIGH (ref 83–108)
pO2, Arterial: 110 mmHg — ABNORMAL HIGH (ref 83–108)
pO2, Arterial: 67 mmHg — ABNORMAL LOW (ref 83–108)
pO2, Arterial: 74 mmHg — ABNORMAL LOW (ref 83–108)

## 2024-03-27 LAB — CBC
HCT: 44.1 % (ref 39.0–52.0)
HCT: 41.3 % (ref 39.0–52.0)
HCT: 41.7 % (ref 39.0–52.0)
Hemoglobin: 15.4 g/dL (ref 13.0–17.0)
Hemoglobin: 14.2 g/dL (ref 13.0–17.0)
Hemoglobin: 14.5 g/dL (ref 13.0–17.0)
MCH: 34.2 pg — ABNORMAL HIGH (ref 26.0–34.0)
MCH: 34 pg (ref 26.0–34.0)
MCH: 34 pg (ref 26.0–34.0)
MCHC: 34.9 g/dL (ref 30.0–36.0)
MCHC: 34.4 g/dL (ref 30.0–36.0)
MCHC: 34.8 g/dL (ref 30.0–36.0)
MCV: 98 fL (ref 80.0–100.0)
MCV: 98.8 fL (ref 80.0–100.0)
MCV: 97.7 fL (ref 80.0–100.0)
Platelets: 126 K/uL — ABNORMAL LOW (ref 150–400)
Platelets: 108 K/uL — ABNORMAL LOW (ref 150–400)
Platelets: 118 K/uL — ABNORMAL LOW (ref 150–400)
RBC: 4.5 MIL/uL (ref 4.22–5.81)
RBC: 4.18 MIL/uL — ABNORMAL LOW (ref 4.22–5.81)
RBC: 4.27 MIL/uL (ref 4.22–5.81)
RDW: 12.8 % (ref 11.5–15.5)
RDW: 12.9 % (ref 11.5–15.5)
RDW: 13.1 % (ref 11.5–15.5)
WBC: 6.7 K/uL (ref 4.0–10.5)
WBC: 12.1 K/uL — ABNORMAL HIGH (ref 4.0–10.5)
WBC: 13.8 K/uL — ABNORMAL HIGH (ref 4.0–10.5)
nRBC: 0 % (ref 0.0–0.2)
nRBC: 0 % (ref 0.0–0.2)
nRBC: 0 % (ref 0.0–0.2)

## 2024-03-27 LAB — ABO/RH: ABO/RH(D): O POS

## 2024-03-27 LAB — POCT I-STAT, CHEM 8
BUN: 19 mg/dL (ref 8–23)
BUN: 19 mg/dL (ref 8–23)
BUN: 17 mg/dL (ref 8–23)
BUN: 18 mg/dL (ref 8–23)
BUN: 17 mg/dL (ref 8–23)
Calcium, Ion: 1.22 mmol/L (ref 1.15–1.40)
Calcium, Ion: 1.2 mmol/L (ref 1.15–1.40)
Calcium, Ion: 1.09 mmol/L — ABNORMAL LOW (ref 1.15–1.40)
Calcium, Ion: 1.12 mmol/L — ABNORMAL LOW (ref 1.15–1.40)
Calcium, Ion: 1.11 mmol/L — ABNORMAL LOW (ref 1.15–1.40)
Chloride: 105 mmol/L (ref 98–111)
Chloride: 105 mmol/L (ref 98–111)
Chloride: 103 mmol/L (ref 98–111)
Chloride: 105 mmol/L (ref 98–111)
Chloride: 106 mmol/L (ref 98–111)
Creatinine, Ser: 0.6 mg/dL — ABNORMAL LOW (ref 0.61–1.24)
Creatinine, Ser: 0.6 mg/dL — ABNORMAL LOW (ref 0.61–1.24)
Creatinine, Ser: 0.6 mg/dL — ABNORMAL LOW (ref 0.61–1.24)
Creatinine, Ser: 0.6 mg/dL — ABNORMAL LOW (ref 0.61–1.24)
Creatinine, Ser: 0.6 mg/dL — ABNORMAL LOW (ref 0.61–1.24)
Glucose, Bld: 156 mg/dL — ABNORMAL HIGH (ref 70–99)
Glucose, Bld: 159 mg/dL — ABNORMAL HIGH (ref 70–99)
Glucose, Bld: 144 mg/dL — ABNORMAL HIGH (ref 70–99)
Glucose, Bld: 173 mg/dL — ABNORMAL HIGH (ref 70–99)
Glucose, Bld: 190 mg/dL — ABNORMAL HIGH (ref 70–99)
HCT: 42 % (ref 39.0–52.0)
HCT: 42 % (ref 39.0–52.0)
HCT: 33 % — ABNORMAL LOW (ref 39.0–52.0)
HCT: 34 % — ABNORMAL LOW (ref 39.0–52.0)
HCT: 36 % — ABNORMAL LOW (ref 39.0–52.0)
Hemoglobin: 14.3 g/dL (ref 13.0–17.0)
Hemoglobin: 14.3 g/dL (ref 13.0–17.0)
Hemoglobin: 11.2 g/dL — ABNORMAL LOW (ref 13.0–17.0)
Hemoglobin: 11.6 g/dL — ABNORMAL LOW (ref 13.0–17.0)
Hemoglobin: 12.2 g/dL — ABNORMAL LOW (ref 13.0–17.0)
Potassium: 4.4 mmol/L (ref 3.5–5.1)
Potassium: 4.5 mmol/L (ref 3.5–5.1)
Potassium: 4.9 mmol/L (ref 3.5–5.1)
Potassium: 5.8 mmol/L — ABNORMAL HIGH (ref 3.5–5.1)
Potassium: 5.3 mmol/L — ABNORMAL HIGH (ref 3.5–5.1)
Sodium: 139 mmol/L (ref 135–145)
Sodium: 138 mmol/L (ref 135–145)
Sodium: 137 mmol/L (ref 135–145)
Sodium: 138 mmol/L (ref 135–145)
Sodium: 137 mmol/L (ref 135–145)
TCO2: 24 mmol/L (ref 22–32)
TCO2: 23 mmol/L (ref 22–32)
TCO2: 25 mmol/L (ref 22–32)
TCO2: 26 mmol/L (ref 22–32)
TCO2: 22 mmol/L (ref 22–32)

## 2024-03-27 LAB — GLUCOSE, CAPILLARY
Glucose-Capillary: 127 mg/dL — ABNORMAL HIGH (ref 70–99)
Glucose-Capillary: 141 mg/dL — ABNORMAL HIGH (ref 70–99)
Glucose-Capillary: 142 mg/dL — ABNORMAL HIGH (ref 70–99)
Glucose-Capillary: 142 mg/dL — ABNORMAL HIGH (ref 70–99)
Glucose-Capillary: 146 mg/dL — ABNORMAL HIGH (ref 70–99)
Glucose-Capillary: 149 mg/dL — ABNORMAL HIGH (ref 70–99)
Glucose-Capillary: 150 mg/dL — ABNORMAL HIGH (ref 70–99)
Glucose-Capillary: 159 mg/dL — ABNORMAL HIGH (ref 70–99)
Glucose-Capillary: 169 mg/dL — ABNORMAL HIGH (ref 70–99)
Glucose-Capillary: 174 mg/dL — ABNORMAL HIGH (ref 70–99)

## 2024-03-27 LAB — ECHO INTRAOPERATIVE TEE
AR max vel: 3.14 cm2
AV Area VTI: 3.59 cm2
AV Area mean vel: 3.88 cm2
AV Mean grad: 3 mmHg
AV Peak grad: 6.4 mmHg
Ao pk vel: 1.26 m/s
Area-P 1/2: 3.25 cm2
Height: 69 in
Weight: 3024 [oz_av]

## 2024-03-27 LAB — BASIC METABOLIC PANEL WITH GFR
Anion gap: 10 (ref 5–15)
Anion gap: 9 (ref 5–15)
BUN: 20 mg/dL (ref 8–23)
BUN: 16 mg/dL (ref 8–23)
CO2: 22 mmol/L (ref 22–32)
CO2: 21 mmol/L — ABNORMAL LOW (ref 22–32)
Calcium: 9 mg/dL (ref 8.9–10.3)
Calcium: 8 mg/dL — ABNORMAL LOW (ref 8.9–10.3)
Chloride: 105 mmol/L (ref 98–111)
Chloride: 104 mmol/L (ref 98–111)
Creatinine, Ser: 0.65 mg/dL (ref 0.61–1.24)
Creatinine, Ser: 0.57 mg/dL — ABNORMAL LOW (ref 0.61–1.24)
GFR, Estimated: >60 mL/min
GFR, Estimated: >60 mL/min
Glucose, Bld: 138 mg/dL — ABNORMAL HIGH (ref 70–99)
Glucose, Bld: 171 mg/dL — ABNORMAL HIGH (ref 70–99)
Potassium: 4.3 mmol/L (ref 3.5–5.1)
Potassium: 4.1 mmol/L (ref 3.5–5.1)
Sodium: 137 mmol/L (ref 135–145)
Sodium: 134 mmol/L — ABNORMAL LOW (ref 135–145)

## 2024-03-27 LAB — APTT: aPTT: 33 s (ref 24–36)

## 2024-03-27 LAB — HEPARIN LEVEL (UNFRACTIONATED): Heparin Unfractionated: 0.39 [IU]/mL (ref 0.30–0.70)

## 2024-03-27 LAB — PLATELET COUNT: Platelets: 100 K/uL — ABNORMAL LOW (ref 150–400)

## 2024-03-27 LAB — HEMOGLOBIN AND HEMATOCRIT, BLOOD
HCT: 34.7 % — ABNORMAL LOW (ref 39.0–52.0)
Hemoglobin: 12 g/dL — ABNORMAL LOW (ref 13.0–17.0)

## 2024-03-27 LAB — MAGNESIUM: Magnesium: 2.5 mg/dL — ABNORMAL HIGH (ref 1.7–2.4)

## 2024-03-27 LAB — PROTIME-INR
INR: 1.3 — ABNORMAL HIGH (ref 0.8–1.2)
Prothrombin Time: 16.6 s — ABNORMAL HIGH (ref 11.4–15.2)

## 2024-03-27 LAB — MRSA NEXT GEN BY PCR, NASAL: MRSA by PCR Next Gen: NOT DETECTED

## 2024-03-27 MED ORDER — ASPIRIN 81 MG PO CHEW
324.0000 mg | CHEWABLE_TABLET | Freq: Once | ORAL | Status: AC
  Administered 2024-03-27: 324 mg via ORAL
  Filled 2024-03-27: qty 4

## 2024-03-27 MED ORDER — MIDAZOLAM HCL (PF) 2 MG/2ML IJ SOLN: 2.0000 mg | INTRAMUSCULAR | Status: DC | PRN

## 2024-03-27 MED ORDER — FENTANYL CITRATE (PF) 250 MCG/5ML IJ SOLN
INTRAMUSCULAR | Status: AC
  Filled 2024-03-27: qty 5

## 2024-03-27 MED ORDER — ALBUMIN HUMAN 5 % IV SOLN: 250.0000 mL | INTRAVENOUS | Status: DC | PRN

## 2024-03-27 MED ORDER — PANTOPRAZOLE SODIUM 40 MG PO TBEC
40.0000 mg | DELAYED_RELEASE_TABLET | Freq: Every day | ORAL | Status: DC
  Administered 2024-03-29 – 2024-04-01 (×4): 40 mg via ORAL
  Filled 2024-03-27 (×4): qty 1

## 2024-03-27 MED ORDER — CHLORHEXIDINE GLUCONATE 0.12 % MT SOLN
15.0000 mL | OROMUCOSAL | Status: AC
  Administered 2024-03-27: 15 mL via OROMUCOSAL
  Filled 2024-03-27: qty 15

## 2024-03-27 MED ORDER — SODIUM CHLORIDE 0.45 % IV SOLN: INTRAVENOUS | Status: DC | PRN

## 2024-03-27 MED ORDER — HEMOSTATIC AGENTS (NO CHARGE) OPTIME
TOPICAL | Status: DC | PRN
  Administered 2024-03-27: 1 via TOPICAL

## 2024-03-27 MED ORDER — FENTANYL CITRATE (PF) 100 MCG/2ML IJ SOLN
INTRAMUSCULAR | Status: DC | PRN
  Administered 2024-03-27: 100 ug via INTRAVENOUS
  Administered 2024-03-27 (×3): 50 ug via INTRAVENOUS
  Administered 2024-03-27 (×3): 100 ug via INTRAVENOUS
  Administered 2024-03-27: 200 ug via INTRAVENOUS
  Administered 2024-03-27: 100 ug via INTRAVENOUS
  Administered 2024-03-27 (×2): 50 ug via INTRAVENOUS

## 2024-03-27 MED ORDER — PHENYLEPHRINE HCL (PRESSORS) 10 MG/ML IV SOLN
INTRAVENOUS | Status: DC | PRN
  Administered 2024-03-27 (×2): 80 ug via INTRAVENOUS

## 2024-03-27 MED ORDER — ROCURONIUM BROMIDE 100 MG/10ML IV SOLN
INTRAVENOUS | Status: DC | PRN
  Administered 2024-03-27: 50 mg via INTRAVENOUS
  Administered 2024-03-27: 100 mg via INTRAVENOUS
  Administered 2024-03-27: 50 mg via INTRAVENOUS
  Administered 2024-03-27: 30 mg via INTRAVENOUS

## 2024-03-27 MED ORDER — BISACODYL 5 MG PO TBEC
10.0000 mg | DELAYED_RELEASE_TABLET | Freq: Every day | ORAL | Status: DC
  Administered 2024-03-28 – 2024-03-29 (×2): 10 mg via ORAL
  Filled 2024-03-27 (×2): qty 2

## 2024-03-27 MED ORDER — BISACODYL 10 MG RE SUPP: 10.0000 mg | Freq: Every day | RECTAL | Status: DC

## 2024-03-27 MED ORDER — VANCOMYCIN HCL IN DEXTROSE 1-5 GM/200ML-% IV SOLN
1000.0000 mg | Freq: Once | INTRAVENOUS | Status: AC
  Administered 2024-03-27: 1000 mg via INTRAVENOUS
  Filled 2024-03-27: qty 200

## 2024-03-27 MED ORDER — NITROGLYCERIN IN D5W 200-5 MCG/ML-% IV SOLN: 0.0000 ug/min | INTRAVENOUS | Status: DC

## 2024-03-27 MED ORDER — ONDANSETRON HCL 4 MG/2ML IJ SOLN: 4.0000 mg | Freq: Four times a day (QID) | INTRAMUSCULAR | Status: DC | PRN

## 2024-03-27 MED ORDER — NITROGLYCERIN 0.2 MG/ML ON CALL CATH LAB
INTRAVENOUS | Status: DC | PRN
  Administered 2024-03-27: 40 ug via INTRAVENOUS

## 2024-03-27 MED ORDER — HEPARIN SODIUM (PORCINE) 1000 UNIT/ML IJ SOLN
INTRAMUSCULAR | Status: AC
  Filled 2024-03-27: qty 10

## 2024-03-27 MED ORDER — LACTATED RINGERS IV SOLN: INTRAVENOUS | Status: DC | PRN

## 2024-03-27 MED ORDER — LACTATED RINGERS IV SOLN: INTRAVENOUS | Status: AC

## 2024-03-27 MED ORDER — PROPOFOL 10 MG/ML IV BOLUS
INTRAVENOUS | Status: DC | PRN
  Administered 2024-03-27: 50 mg via INTRAVENOUS

## 2024-03-27 MED ORDER — METOPROLOL TARTRATE 12.5 MG HALF TABLET: 12.5000 mg | ORAL_TABLET | Freq: Two times a day (BID) | ORAL | Status: DC

## 2024-03-27 MED ORDER — DEXMEDETOMIDINE HCL IN NACL 400 MCG/100ML IV SOLN: 0.0000 ug/kg/h | INTRAVENOUS | Status: DC

## 2024-03-27 MED ORDER — MORPHINE SULFATE (PF) 2 MG/ML IV SOLN
1.0000 mg | INTRAVENOUS | Status: DC | PRN
  Administered 2024-03-27 (×3): 2 mg via INTRAVENOUS
  Filled 2024-03-27 (×3): qty 1

## 2024-03-27 MED ORDER — PROTAMINE SULFATE 10 MG/ML IV SOLN
INTRAVENOUS | Status: AC
  Filled 2024-03-27: qty 5

## 2024-03-27 MED ORDER — SODIUM CHLORIDE 0.9% FLUSH
3.0000 mL | Freq: Two times a day (BID) | INTRAVENOUS | Status: DC
  Administered 2024-03-28 – 2024-03-30 (×5): 3 mL via INTRAVENOUS

## 2024-03-27 MED ORDER — ACETAMINOPHEN 160 MG/5ML PO SOLN
650.0000 mg | Freq: Once | ORAL | Status: AC
  Administered 2024-03-27: 650 mg
  Filled 2024-03-27: qty 20.3

## 2024-03-27 MED ORDER — CHLORHEXIDINE GLUCONATE CLOTH 2 % EX PADS
6.0000 | MEDICATED_PAD | Freq: Every day | CUTANEOUS | Status: DC
  Administered 2024-03-27 – 2024-03-30 (×4): 6 via TOPICAL

## 2024-03-27 MED ORDER — DOCUSATE SODIUM 100 MG PO CAPS
200.0000 mg | ORAL_CAPSULE | Freq: Every day | ORAL | Status: DC
  Administered 2024-03-28 – 2024-03-29 (×2): 200 mg via ORAL
  Filled 2024-03-27 (×3): qty 2

## 2024-03-27 MED ORDER — ALBUMIN HUMAN 5 % IV SOLN: INTRAVENOUS | Status: DC | PRN

## 2024-03-27 MED ORDER — DEXTROSE 50 % IV SOLN: 0.0000 mL | INTRAVENOUS | Status: DC | PRN

## 2024-03-27 MED ORDER — EPHEDRINE SULFATE-NACL 50-0.9 MG/10ML-% IV SOSY
PREFILLED_SYRINGE | INTRAVENOUS | Status: DC | PRN
  Administered 2024-03-27: 2.5 mg via INTRAVENOUS

## 2024-03-27 MED ORDER — PHENYLEPHRINE HCL-NACL 20-0.9 MG/250ML-% IV SOLN: 0.0000 ug/min | INTRAVENOUS | Status: DC

## 2024-03-27 MED ORDER — ACETAMINOPHEN 500 MG PO TABS
1000.0000 mg | ORAL_TABLET | Freq: Four times a day (QID) | ORAL | Status: DC
  Administered 2024-03-27 – 2024-04-01 (×18): 1000 mg via ORAL
  Filled 2024-03-27 (×18): qty 2

## 2024-03-27 MED ORDER — THROMBIN 20000 UNITS EX SOLR: OROMUCOSAL | Status: DC | PRN

## 2024-03-27 MED ORDER — ASPIRIN 81 MG PO CHEW: 324.0000 mg | CHEWABLE_TABLET | Freq: Every day | ORAL | Status: DC

## 2024-03-27 MED ORDER — THROMBIN (RECOMBINANT) 20000 UNITS EX SOLR
CUTANEOUS | Status: AC
  Filled 2024-03-27: qty 20000

## 2024-03-27 MED ORDER — OXYCODONE HCL 5 MG PO TABS
5.0000 mg | ORAL_TABLET | ORAL | Status: DC | PRN
  Administered 2024-03-27 (×2): 5 mg via ORAL
  Administered 2024-03-28 (×2): 10 mg via ORAL
  Filled 2024-03-27 (×2): qty 1
  Filled 2024-03-27 (×2): qty 2

## 2024-03-27 MED ORDER — SUGAMMADEX SODIUM 200 MG/2ML IV SOLN
INTRAVENOUS | Status: DC | PRN
  Administered 2024-03-27: 200 mg via INTRAVENOUS

## 2024-03-27 MED ORDER — CEFAZOLIN SODIUM-DEXTROSE 2-4 GM/100ML-% IV SOLN
2.0000 g | Freq: Three times a day (TID) | INTRAVENOUS | Status: AC
  Administered 2024-03-27 – 2024-03-29 (×6): 2 g via INTRAVENOUS
  Filled 2024-03-27 (×6): qty 100

## 2024-03-27 MED ORDER — LIDOCAINE HCL (CARDIAC) PF 100 MG/5ML IV SOSY
PREFILLED_SYRINGE | INTRAVENOUS | Status: DC | PRN
  Administered 2024-03-27: 100 mg via INTRAVENOUS

## 2024-03-27 MED ORDER — LIDOCAINE 2% (20 MG/ML) 5 ML SYRINGE
INTRAMUSCULAR | Status: AC
  Filled 2024-03-27: qty 5

## 2024-03-27 MED ORDER — MIDAZOLAM HCL (PF) 10 MG/2ML IJ SOLN
INTRAMUSCULAR | Status: AC
  Filled 2024-03-27: qty 2

## 2024-03-27 MED ORDER — TRAMADOL HCL 50 MG PO TABS
50.0000 mg | ORAL_TABLET | ORAL | Status: DC | PRN
  Administered 2024-03-27 – 2024-03-28 (×3): 100 mg via ORAL
  Filled 2024-03-27 (×3): qty 2

## 2024-03-27 MED ORDER — ASPIRIN 325 MG PO TBEC
325.0000 mg | DELAYED_RELEASE_TABLET | Freq: Every day | ORAL | Status: DC
  Administered 2024-03-28 – 2024-03-30 (×3): 325 mg via ORAL
  Filled 2024-03-27 (×3): qty 1

## 2024-03-27 MED ORDER — MAGNESIUM SULFATE 4 GM/100ML IV SOLN
4.0000 g | Freq: Once | INTRAVENOUS | Status: AC
  Administered 2024-03-27: 4 g via INTRAVENOUS
  Filled 2024-03-27: qty 100

## 2024-03-27 MED ORDER — SODIUM CHLORIDE 0.9 % IV SOLN: INTRAVENOUS | Status: DC | PRN

## 2024-03-27 MED ORDER — EPINEPHRINE HCL 5 MG/250ML IV SOLN IN NS: 0.0000 ug/min | INTRAVENOUS | Status: DC

## 2024-03-27 MED ORDER — ROCURONIUM BROMIDE 10 MG/ML (PF) SYRINGE
PREFILLED_SYRINGE | INTRAVENOUS | Status: AC
  Filled 2024-03-27: qty 20

## 2024-03-27 MED ORDER — METOPROLOL TARTRATE 5 MG/5ML IV SOLN
2.5000 mg | INTRAVENOUS | Status: DC | PRN
  Administered 2024-03-27: 2.5 mg via INTRAVENOUS
  Filled 2024-03-27: qty 5

## 2024-03-27 MED ORDER — PANTOPRAZOLE SODIUM 40 MG IV SOLR
40.0000 mg | Freq: Every day | INTRAVENOUS | Status: AC
  Administered 2024-03-27 – 2024-03-28 (×2): 40 mg via INTRAVENOUS
  Filled 2024-03-27 (×2): qty 10

## 2024-03-27 MED ORDER — PLASMA-LYTE A IV SOLN: INTRAVENOUS | Status: DC | PRN

## 2024-03-27 MED ORDER — HEPARIN SODIUM (PORCINE) 1000 UNIT/ML IJ SOLN
INTRAMUSCULAR | Status: AC
  Filled 2024-03-27: qty 1

## 2024-03-27 MED ORDER — THROMBIN 20000 UNITS EX SOLR: CUTANEOUS | Status: DC | PRN

## 2024-03-27 MED ORDER — METOCLOPRAMIDE HCL 5 MG/ML IJ SOLN
10.0000 mg | Freq: Four times a day (QID) | INTRAMUSCULAR | Status: AC
  Administered 2024-03-27 – 2024-03-28 (×5): 10 mg via INTRAVENOUS
  Filled 2024-03-27 (×5): qty 2

## 2024-03-27 MED ORDER — MIDAZOLAM HCL (PF) 5 MG/ML IJ SOLN
INTRAMUSCULAR | Status: DC | PRN
  Administered 2024-03-27: 1 mg via INTRAVENOUS

## 2024-03-27 MED ORDER — 0.9 % SODIUM CHLORIDE (POUR BTL) OPTIME
TOPICAL | Status: DC | PRN
  Administered 2024-03-27: 5000 mL

## 2024-03-27 MED ORDER — PROTAMINE SULFATE 10 MG/ML IV SOLN
INTRAVENOUS | Status: DC | PRN
  Administered 2024-03-27: 270 mg via INTRAVENOUS
  Administered 2024-03-27: 30 mg via INTRAVENOUS

## 2024-03-27 MED ORDER — INSULIN REGULAR(HUMAN) IN NACL 100-0.9 UT/100ML-% IV SOLN: INTRAVENOUS | Status: DC

## 2024-03-27 MED ORDER — HEPARIN SODIUM (PORCINE) 1000 UNIT/ML IJ SOLN
INTRAMUSCULAR | Status: DC | PRN
  Administered 2024-03-27: 4000 [IU] via INTRAVENOUS
  Administered 2024-03-27: 30000 [IU] via INTRAVENOUS

## 2024-03-27 MED ORDER — POTASSIUM CHLORIDE 10 MEQ/50ML IV SOLN: 10.0000 meq | INTRAVENOUS | Status: AC

## 2024-03-27 MED ORDER — ACETAMINOPHEN 160 MG/5ML PO SOLN: 1000.0000 mg | Freq: Four times a day (QID) | ORAL | Status: DC

## 2024-03-27 MED ORDER — PROPOFOL 10 MG/ML IV BOLUS
INTRAVENOUS | Status: AC
  Filled 2024-03-27: qty 20

## 2024-03-27 MED ORDER — PROTAMINE SULFATE 10 MG/ML IV SOLN
INTRAVENOUS | Status: AC
  Filled 2024-03-27: qty 25

## 2024-03-27 MED ORDER — SODIUM CHLORIDE 0.9 % IV SOLN: INTRAVENOUS | Status: DC

## 2024-03-27 MED ORDER — METOPROLOL TARTRATE 25 MG/10 ML ORAL SUSPENSION: 12.5000 mg | Freq: Two times a day (BID) | ORAL | Status: DC

## 2024-03-27 MED ORDER — DIPHENHYDRAMINE HCL 25 MG PO CAPS: 25.0000 mg | ORAL_CAPSULE | ORAL | Status: DC | PRN

## 2024-03-27 MED ORDER — SODIUM CHLORIDE 0.9 % IV SOLN: 250.0000 mL | INTRAVENOUS | Status: AC

## 2024-03-27 MED ORDER — SODIUM CHLORIDE 0.9% FLUSH: 3.0000 mL | INTRAVENOUS | Status: DC | PRN

## 2024-03-27 MED FILL — Magnesium Sulfate Inj 50%: INTRAMUSCULAR | Qty: 10 | Status: AC

## 2024-03-27 MED FILL — Potassium Chloride Inj 2 mEq/ML: INTRAVENOUS | Qty: 40 | Status: AC

## 2024-03-27 MED FILL — Heparin Sodium (Porcine) Inj 1000 Unit/ML: Qty: 1000 | Status: AC

## 2024-03-27 NOTE — Progress Notes (Signed)
" °  Echocardiogram Echocardiogram Transesophageal has been performed.  Devora Ellouise SAUNDERS 03/27/2024, 8:15 AM "

## 2024-03-27 NOTE — Discharge Summary (Signed)
 "      [address] [place] [address]             [phone]        Physician Discharge Summary  Patient ID: Randy Evans MRN: [id number] DOB/AGE: 11-05-1940 83 y.o.  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, carrier of hereditary hemochromatosis    Cerebral aneurysm, nonruptured    Gout      Discharge Diagnoses:  Patient Active Problem List   Diagnosis Date Noted   Hyperglycemia 03/30/2024   Atrial fibrillation with RVR (HCC) 03/30/2024   Coronary artery disease 03/27/2024   S/P CABG x 3 03/27/2024   Chest pain 03/23/2024   ACD (adult celiac disease) 11/18/2021   Plantar fasciitis 01/24/2017   Atrial fibrillation (HCC)    Hypertension    Long term current use of anticoagulant therapy    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 Per patient, he had dizziness and fatigue that started around October of this year. He was seen by his cardiologist, Dr. Kate  and a workup was undertaken. More recently, patient had chest discomfort/tightness, which occurred 12/16 Tuesday evening, and he took a Nitroglycerin  with relief. On Wednesday 12/17 , he left his hat in short stay (had cardiac catheterization done earlier in the day) and upon walking in to get his hat, he had chest pain. Again, he took a Nitroglycerin  which relieved his pain. Thursday night 12/18, upon going to bed,  chest pain occurred again and again was relieved with Nitroglycerin . Ultimately, he had several more episodes of chest pain and took Nitroglycerin . He called Dr. Alvan office earlier this am 12/19 and was advised to go to the ED. He had a cardiac MRI October 2024 that showed LVEF to be 56%, mild hypokinesis of mid inferolateral wall,  basal and mid inferolateral wall LGE subendocardial and mid myocardial (most consistent with prior infarct), no evidence of myocardial iron overload,  mild dilation of sinus of Valsalva, 43 mm, mid ascending aorta 42 mm, and proximal descending aorta 36 mm, and mild mitral regurgitation. Stress PET December 2025 showed LAD ischemia, reduced myocardial blood flow reserve, high risk study. Echocardiogram done 03/20/2024 showed LVEF 55-60%, the left ventricle has no regional wall motion abnormalities, trivial MR and AI, and dilatation of the ascending aorta measuring 44 mm. Cardiac catheterization done on 03/20/2024 showed 60% left main disease, proximal 90% LAD and proximal septal 80% stenosis, no significant disease in the Circumflex and 40% in the  RCA.  Surgeon discussed the need for coronary artery bypass grafting surgery. Potential risks, benefits, and complications of the surgery were discussed with the patient and he agreed to proceed with surgery. Pre operative carotid duplex US  showed no significant internal carotid artery stenosis bilaterally.  Hospital Course: Patient underwent a CABG x 3. He was transported from the OR to [facility] ICU in stable condition. He  was extubated the afternoon of surgery. Norva Purl, a line, and chest tubes were removed on POD 1. He was restarted on Coreg  (as taken prior to surgery). On EKG, he had bifascicular block so EPW remained a few days. He was transitioned off the Insulin  drip. His pre op HGA1C was 6.8. He will be restarted on Farxiga later in his hospital course. He has a history of paroxysmal atrial fibrillation and will be restarted on Apixaban . Amiodarone  protocol was started.  He was converted to oral regimen of amiodarone .  His pacing wires were removed without difficulty.  He was felt stable for transfer to the progressive care unit on 03/30/2024.  He will be resumed on Eliquis  prior to discharge.  He is continued making excellent progress.  He is diuresing well but will require some further as an outpatient.  Incisions are healing well without evidence of infection.  He is tolerating diet and routine activities.  Suggested weaning maintains good saturations on room air.  He is maintaining sinus rhythm.  Overall at the time of discharge the patient felt quite stable.  Consults: cardiology  Significant Diagnostic Studies:  DG Chest Port 1 View Result Date: 03/29/2024 CLINICAL DATA:  Status post CABG. EXAM: PORTABLE CHEST 1 VIEW COMPARISON:  03/28/2024 FINDINGS: Low volume film. Interval removal of multiple thoracic drains. No evidence for pneumothorax. Pulmonary artery catheter is been removed in the interval with right IJ sheath still in place. Mild vascular congestion. Residual atelectasis noted at the left base although decreased in the interval. Telemetry leads overlie the chest. IMPRESSION: Interval removal of multiple thoracic drains and pulmonary artery catheter. No evidence for pneumothorax. Electronically Signed   By: Camellia Candle M.D.   On: 03/29/2024 07:55   DG Chest Port 1 View Result Date: 03/28/2024 EXAM: 1 VIEW(S) XRAY OF THE CHEST 03/28/2024 05:35:00 AM COMPARISON: 03/27/2024 CLINICAL HISTORY:  Pneumothorax FINDINGS: LINES, TUBES AND DEVICES: Right IJ PA catheter with tip overlying the expected area of the right main pulmonary artery. Endotracheal and enteric tubes removed. Mediastinal drain and left chest tube stable in place. LUNGS AND PLEURA: Interstitial opacities, likely pulmonary edema. Improving Left basilar atelectasis. No pleural effusion. No pneumothorax. HEART AND MEDIASTINUM: No acute abnormality of the cardiac and mediastinal silhouettes. BONES AND SOFT TISSUES: Median sternotomy noted. IMPRESSION: 1. No pneumothorax. 2. Interstitial opacities, likely pulmonary edema. 3. Improving left basilar atelectasis. Electronically signed by: Katheleen Faes MD 03/28/2024 12:28 PM EST RP Workstation: [id number]   DG Chest Port 1 View Result Date: 03/27/2024 CLINICAL DATA:  Pneumothorax. EXAM: PORTABLE CHEST 1 VIEW COMPARISON:  Chest CT dated 03/23/2024. FINDINGS: Endotracheal tube proximally 4.5 cm above the carina. Enteric tube extends below the diaphragm with tip in the left upper abdomen likely in the proximal stomach. Right IJ Swan-Ganz with tip right pulmonary artery. Inferiorly accessed mediastinal drain as well as left-sided chest tube. No detectable pneumothorax. There is cardiomegaly with vascular congestion and edema. Left lung base atelectasis. Pneumonia is not excluded. No acute osseous pathology. Median sternotomy wires. IMPRESSION: 1. Support devices as above. No detectable pneumothorax. 2. Cardiomegaly with vascular congestion and  edema. Electronically Signed   By: Vanetta Chou M.D.   On: 03/27/2024 15:33   ECHO INTRAOPERATIVE TEE Result Date: 03/27/2024  *INTRAOPERATIVE TRANSESOPHAGEAL REPORT *  Patient Name:   Randy Evans Date of Exam: 03/27/2024 Medical Rec #:  [id number]        Height:       69.0 in Accession #:    [id number]       Weight:       189.0 lb Date of Birth:  1940-10-06        BSA:          2.02 m Patient Age:    83 years         BP:           124/74 mmHg  Patient Gender: M                HR:           63 bpm. Exam Location:  Inpatient Transesophogeal exam was perform intraoperatively during surgical procedure. Patient was closely monitored under general anesthesia during the entirety of examination. Indications:     I25.110 Atherosclerotic heart disease of native coronary artery                  with unstable angina pectoris Performing Phys: [id number] DORISE K BARTLE Diagnosing Phys: Lynwood Cornea Complications: No known complications during this procedure. POST-OP IMPRESSIONS _ Left Ventricle: The left ventricle is unchanged from pre-bypass. Low normal systolic function, approximately 50%. _ Right Ventricle: The right ventricle appears unchanged from pre-bypass; normal function. _ Aorta: The aorta appears unchanged from pre-bypass. There is no dissection present in the aorta. _ Left Atrial Appendage: The left atrial appendage appears unchanged from pre-bypass. _ Aortic Valve: The aortic valve appears unchanged from pre-bypass. No stenosis present. _ Mitral Valve: The mitral valve appears unchanged from pre-bypass. There is mild regurgitation. _ Tricuspid Valve: The tricuspid valve appears unchanged from pre-bypass. There is mild regurgitation. _ Pulmonic Valve: The pulmonic valve appears unchanged from pre-bypass. _ Interatrial Septum: The interatrial septum appears unchanged from pre-bypass. _ Pericardium: The pericardium appears unchanged from pre-bypass. _ Comments: No significant pericardial effusion. Trace left pleural effusion. PRE-OP FINDINGS  Left Ventricle: The left ventricle has low normal systolic function, with an ejection fraction of 50-55%. The cavity size was normal. There is no left ventricular hypertrophy. Left ventricular diastolic parameters are consistent with Grade III diastolic  dysfunction (restrictive). Right Ventricle: The right ventricle has normal systolic function. The cavity was normal. There is no increase in right ventricular wall thickness.  Left Atrium: Left atrial size was not assessed. No left atrial/left atrial appendage thrombus was detected. Right Atrium: Right atrial size was not assessed. Interatrial Septum: No atrial level shunt detected by color flow Doppler. Pericardium: A small pericardial effusion is present. There is no pleural effusion. Mitral Valve: The mitral valve is normal in structure. Mitral valve regurgitation is mild to moderate by color flow Doppler. There is no evidence of mitral stenosis. There is moderate calcification present. Tricuspid Valve: The tricuspid valve was normal in structure. Tricuspid valve regurgitation is mild by color flow Doppler. No evidence of tricuspid stenosis is present. Aortic Valve: The aortic valve is normal in structure. Aortic valve regurgitation is trivial by color flow Doppler. There is no stenosis of the aortic valve, with a calculated valve area of 3.59 cm. Pulmonic Valve: The pulmonic valve was normal in structure No evidence of pumonic stenosis. Pulmonic valve regurgitation is  trivial by color flow Doppler. +--------------+--------++ LEFT VENTRICLE          +----------------+---------++ +--------------+--------++  Diastology                PLAX 2D                 +----------------+---------++ +--------------+--------++  LV e' lateral:  6.36 cm/s LVOT diam:    2.70 cm   +----------------+---------++ +--------------+--------++  LV E/e' lateral:15.4      LVOT Area:    5.73 cm  +----------------+---------++ +--------------+--------++                        +--------------+--------++  +------------+---------++                             3D Volume EF                                      +------------+---------++                             LV 3D EDV:  140.20 ml                             +------------+---------++                             LV 3D ESV:  70.50 ml                              +------------+---------++ +---------------+------+-------+  RIGHT VENTRICLE              +---------------+------+-------+ TAPSE (M-mode):2.2 cm2.37 cm +---------------+------+-------+ +------------------+-----------++ AORTIC VALVE                  +------------------+-----------++ AV Area (Vmax):   3.14 cm    +------------------+-----------++ AV Area (Vmean):  3.88 cm    +------------------+-----------++ AV Area (VTI):    3.59 cm    +------------------+-----------++ AV Vmax:          126.00 cm/s +------------------+-----------++ AV Vmean:         72.400 cm/s +------------------+-----------++ AV VTI:           0.255 m     +------------------+-----------++ AV Peak Grad:     6.4 mmHg    +------------------+-----------++ AV Mean Grad:     3.0 mmHg    +------------------+-----------++ LVOT Vmax:        69.10 cm/s  +------------------+-----------++ LVOT Vmean:       49.000 cm/s +------------------+-----------++ LVOT VTI:         0.160 m     +------------------+-----------++ LVOT/AV VTI ratio:0.63        +------------------+-----------++ +--------------+----------++ MITRAL VALVE              +--------------+-------+ +--------------+----------++  SHUNTS                MV Area (PHT):3.25 cm    +--------------+-------+ +--------------+----------++  Systemic VTI: 0.16 m  MV PHT:       67.71 msec  +--------------+-------+ +--------------+----------++  Systemic Diam:2.70 cm MV Decel Time:234 msec    +--------------+-------+ +--------------+----------++ +--------------+----------++ MV E velocity:97.90 cm/s +--------------+----------++ MV A  velocity:27.00 cm/s +--------------+----------++ MV E/A ratio: 3.63       +--------------+----------++  Lynwood Cornea Electronically signed by Lynwood Cornea Signature Date/Time: 03/27/2024/2:16:02 PM    Final    LONG TERM MONITOR (3-14 DAYS) Result Date: 03/27/2024   1 episode of NSVT, lasting 5 beats   39 episodes of SVT, longest lasting 14 seconds Patch  Wear Time:  14 days and 0 hours (2025-11-08T08:35:12-498 to 2025-11-22T08:35:12-498) Patient had a min HR of 46 bpm, max HR of 160 bpm, and avg HR of 65 bpm. Predominant underlying rhythm was Sinus Rhythm. Bundle Branch Block/IVCD was present. First Degree AV Block was present. 1 Ventricular Tachycardia runs occurred, the run with the fastest interval lasting 5 beats with a max rate of 141 bpm (avg 108 bpm); the run with the fastest interval was also the longest. 39 Supraventricular Tachycardia runs occurred, the run with the fastest interval lasting 10 beats with a max rate of 160 bpm, the longest lasting 14.3 secs with an avg rate of 108 bpm. Isolated SVEs were rare (<1.0%), SVE Couplets were rare (<1.0%), and SVE Triplets were rare (<1.0%). Isolated VEs were rare (<1.0%), VE Couplets were rare (<1.0%), and no VE Triplets were present. Ventricular Bigeminy and Trigeminy were present.   VAS US  DOPPLER PRE CABG Result Date: 03/26/2024 PREOPERATIVE VASCULAR EVALUATION Patient Name:  Randy Evans  Date of Exam:   03/26/2024 Medical Rec #: [id number]         Accession #:    [id number] Date of Birth: 1940/05/26         Patient Gender: M Patient Age:   67 years Exam Location:  [facility] Procedure:      VAS US  DOPPLER PRE CABG Referring Phys: CON SU --------------------------------------------------------------------------------  Indications:      Pre-CABG. Risk Factors:     Hypertension, past history of smoking, coronary artery                   disease. Other Factors:    A-fib. Comparison Study: No prior exam. Performing Technologist: Edilia Elden Appl  Examination Guidelines: A complete evaluation includes B-mode imaging, spectral Doppler, color Doppler, and power Doppler as needed of all accessible portions of each vessel. Bilateral testing is considered an integral part of a complete examination. Limited examinations for reoccurring indications may be performed as noted.  Right Carotid  Findings: +----------+--------+--------+--------+-------------------------+--------+           PSV cm/sEDV cm/sStenosisDescribe                 Comments +----------+--------+--------+--------+-------------------------+--------+ CCA Prox  94      14                                                +----------+--------+--------+--------+-------------------------+--------+ CCA Distal103     18                                                +----------+--------+--------+--------+-------------------------+--------+ ICA Prox  125     14              heterogenous and calcific         +----------+--------+--------+--------+-------------------------+--------+ ICA Mid   115     37                                                +----------+--------+--------+--------+-------------------------+--------+  ICA Distal96      28                                                +----------+--------+--------+--------+-------------------------+--------+ ECA       111     21                                                +----------+--------+--------+--------+-------------------------+--------+ +----------+--------+-------+----------------+------------+           PSV cm/sEDV cmsDescribe        Arm Pressure +----------+--------+-------+----------------+------------+ Subclavian78             Multiphasic, [id number]          +----------+--------+-------+----------------+------------+ +---------+--------+--+--------+-+---------+ VertebralPSV cm/s36EDV cm/s7Antegrade +---------+--------+--+--------+-+---------+ Left Carotid Findings: +----------+--------+--------+--------+-------------------------+--------+           PSV cm/sEDV cm/sStenosisDescribe                 Comments +----------+--------+--------+--------+-------------------------+--------+ CCA Prox  107     16                                                 +----------+--------+--------+--------+-------------------------+--------+ CCA Distal101     16                                                +----------+--------+--------+--------+-------------------------+--------+ ICA Prox  59      15              heterogenous and calcific         +----------+--------+--------+--------+-------------------------+--------+ ICA Mid   58      14                                                +----------+--------+--------+--------+-------------------------+--------+ ICA Distal64      20                                                +----------+--------+--------+--------+-------------------------+--------+ ECA       104     15                                                +----------+--------+--------+--------+-------------------------+--------+ +----------+--------+--------+----------------+------------+ SubclavianPSV cm/sEDV cm/sDescribe        Arm Pressure +----------+--------+--------+----------------+------------+           99      17      Multiphasic, [id number]          +----------+--------+--------+----------------+------------+ +---------+--------+--+--------+--+---------+ VertebralPSV cm/s46EDV cm/s14Antegrade +---------+--------+--+--------+--+---------+  ABI Findings: +------------------+-----+---------+ Rt Pressure (mmHg)IndexWaveform  +------------------+-----+---------+ 124  triphasic +------------------+-----+---------+ 163               1.29 triphasic +------------------+-----+---------+ 160               1.27 triphasic +------------------+-----+---------+ 146               1.16 Normal    +------------------+-----+---------+ +------------------+-----+---------+ Lt Pressure (mmHg)IndexWaveform  +------------------+-----+---------+ 126                    triphasic +------------------+-----+---------+ 172               1.37 triphasic +------------------+-----+---------+  162               1.29 triphasic +------------------+-----+---------+ 149               1.18 Normal    +------------------+-----+---------+ +-------+---------------+ ABI/TBIToday's ABI/TBI +-------+---------------+ Right  1.29/ 1.16      +-------+---------------+ Left   1.37/ 1.18      +-------+---------------+  Right Doppler Findings: +--------+--------+---------+ Site    PressureDoppler   +--------+--------+---------+ [id number]     triphasic +--------+--------+---------+ Radial          triphasic +--------+--------+---------+ Ulnar           triphasic +--------+--------+---------+  Left Doppler Findings: +--------+--------+---------+ Site    PressureDoppler   +--------+--------+---------+ [id number]     triphasic +--------+--------+---------+ Radial          triphasic +--------+--------+---------+ Ulnar           triphasic +--------+--------+---------+   Summary: Right Carotid: Velocities in the right ICA are consistent with a 1-39% stenosis. Left Carotid: Velocities in the left ICA are consistent with a 1-39% stenosis. Vertebrals:  Bilateral vertebral arteries demonstrate antegrade flow. Subclavians: Normal flow hemodynamics were seen in bilateral subclavian              arteries. Right ABI: Resting right ankle-brachial index is within normal range. The right toe-brachial index is normal. Left ABI: Resting left ankle-brachial index is within normal range. The left toe-brachial index is normal. Right Upper Extremity: Doppler waveform obliterate with right radial compression. Doppler waveform obliterate with right ulnar compression. Left Upper Extremity: Doppler waveforms decrease 50% with left radial compression. Doppler waveforms decrease >50% with left ulnar compression.  Electronically signed by Fonda Rim on 03/26/2024 at 9:10:22 PM.    Final    DG Chest 1 View Result Date: 03/23/2024 CLINICAL DATA:  Chest pain. EXAM: CHEST  1 VIEW COMPARISON:  January 25, 2014 FINDINGS: The heart size and mediastinal contours are within normal limits. Both lungs are clear. Multilevel degenerative changes are seen throughout the thoracic spine. IMPRESSION: No active disease. Electronically Signed   By: Suzen Dials M.D.   On: 03/23/2024 13:06   ECHOCARDIOGRAM COMPLETE Result Date: 03/20/2024    ECHOCARDIOGRAM REPORT   Patient Name:   Randy Evans Date of Exam: 03/20/2024 Medical Rec #:  [id number]        Height:       69.0 in Accession #:    [id number]       Weight:       190.0 lb Date of Birth:  1941-02-10        BSA:          2.021 m Patient Age:    83 years         BP:           130/72 mmHg Patient Gender: M  HR:           51 bpm. Exam Location:  [address] Procedure: 2D Echo, 3D Echo, Cardiac Doppler and Color Doppler (Both Spectral            and Color Flow Doppler were utilized during procedure). Indications:    R07.9 Chest Pain  History:        Patient has prior history of Echocardiogram examinations, most                 recent 04/25/2018. Arrythmias:Atrial Fibrillation,                 Signs/Symptoms:Chest Pain; Risk Factors:Family History of                 Coronary Artery Disease, Hypertension and Former Smoker.                 Pre-Operative Eval For CABG, Hemochromatosis, Ascending Aortic                 Aneurysm.  Sonographer:    Heather Hawks RDCS Referring Phys: ELFRIEDA HAGGARD IMPRESSIONS  1. Left ventricular ejection fraction, by estimation, is 55 to 60%. Left ventricular ejection fraction by 3D volume is 65 %. The left ventricle has normal function. The left ventricle has no regional wall motion abnormalities. There is mild left ventricular hypertrophy. Left ventricular diastolic parameters are consistent with Grade III diastolic dysfunction (restrictive). Elevated left atrial pressure.  2. Right ventricular systolic function is low normal. The right ventricular size is normal. There is moderately elevated pulmonary artery systolic  pressure. The estimated right ventricular systolic pressure is 56.9 mmHg.  3. Left atrial size was moderately dilated.  4. Right atrial size was mildly dilated.  5. The mitral valve is normal in structure. Trivial mitral valve regurgitation. No evidence of mitral stenosis.  6. The aortic valve is tricuspid. Aortic valve regurgitation is trivial. No aortic stenosis is present.  7. Aortic dilatation noted. There is dilatation of the ascending aorta, measuring 44 mm. FINDINGS  Left Ventricle: Left ventricular ejection fraction, by estimation, is 55 to 60%. Left ventricular ejection fraction by 3D volume is 65 %. The left ventricle has normal function. The left ventricle has no regional wall motion abnormalities. The left ventricular internal cavity size was normal in size. There is mild left ventricular hypertrophy. Left ventricular diastolic parameters are consistent with Grade III diastolic dysfunction (restrictive). Elevated left atrial pressure. Right Ventricle: The right ventricular size is normal. No increase in right ventricular wall thickness. Right ventricular systolic function is low normal. There is moderately elevated pulmonary artery systolic pressure. The tricuspid regurgitant velocity  is 3.67 m/s, and with an assumed right atrial pressure of 3 mmHg, the estimated right ventricular systolic pressure is 56.9 mmHg. Left Atrium: Left atrial size was moderately dilated. Right Atrium: Right atrial size was mildly dilated. Pericardium: There is no evidence of pericardial effusion. Mitral Valve: The mitral valve is normal in structure. Trivial mitral valve regurgitation. No evidence of mitral valve stenosis. Tricuspid Valve: The tricuspid valve is normal in structure. Tricuspid valve regurgitation is mild. Aortic Valve: The aortic valve is tricuspid. Aortic valve regurgitation is trivial. Aortic regurgitation PHT measures 665 msec. No aortic stenosis is present. Pulmonic Valve: The pulmonic valve was not well  visualized. Pulmonic valve regurgitation is trivial. Aorta: The aortic root is normal in size and structure and aortic dilatation noted. There is dilatation of the ascending aorta, measuring 44 mm. IAS/Shunts: The interatrial septum  was not well visualized. Additional Comments: 3D was performed not requiring image post processing on an independent workstation and was normal.  LEFT VENTRICLE PLAX 2D LVIDd:         5.20 cm         Diastology LVIDs:         2.70 cm         LV e' medial:    5.93 cm/s LV PW:         1.10 cm         LV E/e' medial:  15.6 LV IVS:        1.10 cm         LV e' lateral:   10.09 cm/s LVOT diam:     2.60 cm         LV E/e' lateral: 9.2 LV SV:         114 LV SV Index:   56 LVOT Area:     5.31 cm        3D Volume EF                                LV 3D EF:    Left                                             ventricul                                             ar                                             ejection                                             fraction                                             by 3D                                             volume is                                             65 %.                                 3D Volume EF:  3D EF:        65 %                                LV EDV:       160 ml                                LV ESV:       56 ml                                LV SV:        104 ml RIGHT VENTRICLE RV Basal diam:  3.90 cm    PULMONARY VEINS RV S prime:     9.70 cm/s  A Reversal Velocity: 32.30 cm/s TAPSE (M-mode): 1.9 cm     Diastolic Velocity:  65.80 cm/s RVSP:           61.9 mmHg  S/D Velocity:        0.40                            Systolic Velocity:   25.50 cm/s LEFT ATRIUM             Index        RIGHT ATRIUM           Index LA diam:        4.50 cm 2.23 cm/m   RA Pressure: 8.00 mmHg LA Vol (A2C):   94.4 ml 46.70 ml/m  RA Area:     24.10 cm LA Vol (A4C):   90.4 ml 44.72 ml/m  RA Volume:   79.10 ml  39.13  ml/m LA Biplane Vol: 93.4 ml 46.20 ml/m  AORTIC VALVE LVOT Vmax:   101.75 cm/s LVOT Vmean:  66.300 cm/s LVOT VTI:    0.214 m AI PHT:      665 msec  AORTA Ao Root diam: 3.80 cm Ao Asc diam:  4.25 cm MITRAL VALVE               TRICUSPID VALVE MV Area (PHT)  cm         TR Peak grad:   53.9 mmHg MV Decel Time: 252 msec    TR Vmax:        367.00 cm/s MV E velocity: 92.70 cm/s  Estimated RAP:  8.00 mmHg MV A velocity: 26.65 cm/s  RVSP:           61.9 mmHg MV E/A ratio:  3.48                            SHUNTS                            Systemic VTI:  0.21 m                            Systemic Diam: 2.60 cm Lonni Nanas MD Electronically signed by Lonni Nanas MD Signature Date/Time: 03/20/2024/5:19:27 PM    Final    CARDIAC CATHETERIZATION Result Date: 03/20/2024 Images from the original result were not included. Coronary angiography 03/20/2024: LM: Distal, eccentric 60% calcified stenosis  LAD: Prox 90%, prox-mid 70% stenoses          Proximal septal 80% stenosis Ramus: Medium to large caliber vessel              Prox 20% disease Lcx: Large vessel. No significant disease RCA: Dominant vessel. Prox 40% disease LVEDP 14 mmHg Conclusion: Severe multivessel CAD, including distal LM and prox-mid LAD disease (Consistent with PET/CT findings of anterior ischemia and TID 1.38) Good surgical targets to LAD, ramus and Lcx Recommendation: Surgical consult for CABG Patient is primary caregiver to his wife, does not have any immediate family member that live nearby. He does have good friend and social support system. I mentioned to the patient that he may need short term nursing post op and caregiver for his wife, or caregiver for both him and and his wife 23 X 7. Patient is going to look at all care giving options, but feels confident he could have the necessary support. Separately, he will also need an echocardiogram before surgical consult. Until then, continue current medial therapy and avoid physical  exertion. Manish JINNY Lawrence, MD  NM PET CT CARDIAC PERFUSION MULTI W/ABSOLUTE BLOODFLOW Addendum Date: 03/06/2024   Medium, moderate, reversible defect in the mid septum into apex consistent with LAD ischemia. TID is present. MBFR is reduced globally, but more pronounced in the LAD territory (1.38). Severe LAD calcifications with mild to moderate in the LCX/RCA. Findings are concerning for LAD disease and possibly multi-vessel CAD.   LV perfusion is abnormal. There is evidence of ischemia. There is no evidence of infarction. Defect 1: There is a medium defect with moderate reduction in uptake present in the apical to mid septal and apex location(s) that is reversible. There is abnormal wall motion in the defect area. Consistent with ischemia. The defect is consistent with abnormal perfusion in the LAD territory.   Rest left ventricular function is normal. Rest EF: 50%. Stress left ventricular function is normal. Stress EF: 51%. End diastolic cavity size is mildly enlarged.   Myocardial blood flow was computed to be 0.52ml/g/min at rest and 0.94ml/g/min at stress. Global myocardial blood flow reserve was 1.81 and was mildly abnormal.   Coronary calcium  was present on the attenuation correction CT images. Severe coronary calcifications were present. Coronary calcifications were present in the left anterior descending artery, left circumflex artery and right coronary artery distribution(s).   Findings are consistent with ischemia. The study is high risk.   RV radiotracer uptake noted concerning for pulmonary hypertension.   Electronically signed by Darryle Decent, MD EXAM: OVER-READ OF NON-CARDIAC STRUCTURES ON CARDIAC PET-CT 03/06/2024 08:18:01 AM TECHNIQUE: Cardiac PET CT was performed without iv contrast. Reporting of cardiac CT and cardiac PET findings under separate report by cardiologist. COMPARISON: None available. CLINICAL HISTORY: chest pain and CAD FINDINGS: Perfusion: Cardiac findings are reported  separately by a cardiologist. Function: Cardiac findings are reported separately by a cardiologist. VASCULATURE: Aortic atherosclerosis and coronary artery calcifications. The main pulmonary artery measures 3.8 cm. Unchanged increased caliber of the ascending thoracic aorta measuring 4.2 cm. LUNGS AND PLEURA: Diffuse bronchial wall thickening identified. No pleural fluid, consolidation or signs of pneumothorax. UPPER ABDOMEN: No acute abnormality within the imaged portions of the upper abdomen. Cirrhotic morphology of the liver again noted. OSSEOUS STRUCTURES: No acute osseous findings. CHEST WALL: No chest wall mass. IMPRESSION: 1. Aortic atherosclerosis and coronary artery calcifications. 2. Unchanged dilation of the ascending thoracic aorta measuring 4.2 cm.Recommend annual imaging followup by CTA or MRA. This  recommendation follows 2010 ACCF/AHA/AATS/ACR/ASA/SCA/SCAI/SIR/STS/SVM Guidelines for the Diagnosis and Management of Patients with Thoracic Aortic Disease. Circulation. 2010; 121: Z733-z630. Aortic aneurysm NOS (ICD10-I71.9) 3. Main pulmonary artery measures 3.8 cm, suggestive of pulmonary arterial hypertension. 4. Diffuse bronchial wall thickening. No pleural fluid, consolidation, or pneumothorax. Electronically signed by: Waddell Calk MD 03/06/2024 09:05 AM EST RP Workstation: [id number]   Result Date: 03/06/2024   Medium, moderate, reversible defect in the mid septum into apex consistent with LAD ischemia. TID is present. MBFR is reduced globally, but more pronounced in the LAD territory (1.38). Severe LAD calcifications with mild to moderate in the LCX/RCA. Findings are concerning for LAD disease and possibly multi-vessel CAD.   LV perfusion is abnormal. There is evidence of ischemia. There is no evidence of infarction. Defect 1: There is a medium defect with moderate reduction in uptake present in the apical to mid septal and apex location(s) that is reversible. There is abnormal wall motion in the  defect area. Consistent with ischemia. The defect is consistent with abnormal perfusion in the LAD territory.   Rest left ventricular function is normal. Rest EF: 50%. Stress left ventricular function is normal. Stress EF: 51%. End diastolic cavity size is mildly enlarged.   Myocardial blood flow was computed to be 0.52ml/g/min at rest and 0.94ml/g/min at stress. Global myocardial blood flow reserve was 1.81 and was mildly abnormal.   Coronary calcium  was present on the attenuation correction CT images. Severe coronary calcifications were present. Coronary calcifications were present in the left anterior descending artery, left circumflex artery and right coronary artery distribution(s).   Findings are consistent with ischemia. The study is high risk.   Electronically signed by Darryle Decent, MD EXAM: OVER-READ OF NON-CARDIAC STRUCTURES ON CARDIAC PET-CT 03/06/2024 08:18:01 AM TECHNIQUE: Cardiac PET CT was performed without iv contrast. Reporting of cardiac CT and cardiac PET findings under separate report by cardiologist. COMPARISON: None available. CLINICAL HISTORY: chest pain and CAD FINDINGS: Perfusion: Cardiac findings are reported separately by a cardiologist. Function: Cardiac findings are reported separately by a cardiologist. VASCULATURE: Aortic atherosclerosis and coronary artery calcifications. The main pulmonary artery measures 3.8 cm. Unchanged increased caliber of the ascending thoracic aorta measuring 4.2 cm. LUNGS AND PLEURA: Diffuse bronchial wall thickening identified. No pleural fluid, consolidation or signs of pneumothorax. UPPER ABDOMEN: No acute abnormality within the imaged portions of the upper abdomen. Cirrhotic morphology of the liver again noted. OSSEOUS STRUCTURES: No acute osseous findings. CHEST WALL: No chest wall mass. IMPRESSION: 1. Aortic atherosclerosis and coronary artery calcifications. 2. Unchanged dilation of the ascending thoracic aorta measuring 4.2 cm.Recommend annual  imaging followup by CTA or MRA. This recommendation follows 2010 ACCF/AHA/AATS/ACR/ASA/SCA/SCAI/SIR/STS/SVM Guidelines for the Diagnosis and Management of Patients with Thoracic Aortic Disease. Circulation. 2010; 121: Z733-z630. Aortic aneurysm NOS (ICD10-I71.9) 3. Main pulmonary artery measures 3.8 cm, suggestive of pulmonary arterial hypertension. 4. Diffuse bronchial wall thickening. No pleural fluid, consolidation, or pneumothorax. Electronically signed by: Waddell Calk MD 03/06/2024 09:05 AM EST RP Workstation: [id number]    Results for orders placed or performed during the hospital encounter of 03/23/24 (from the past 48 hours)  Glucose, capillary     Status: Abnormal   Collection Time: 03/30/24 12:15 PM  Result Value Ref Range   Glucose-Capillary 209 (H) 70 - 99 mg/dL    Comment: Glucose reference range applies only to samples taken after fasting for at least 8 hours.  Glucose, capillary     Status: Abnormal   Collection Time: 03/30/24  4:08 PM  Result Value Ref Range   Glucose-Capillary 131 (H) 70 - 99 mg/dL    Comment: Glucose reference range applies only to samples taken after fasting for at least 8 hours.  Glucose, capillary     Status: Abnormal   Collection Time: 03/30/24 10:48 PM  Result Value Ref Range   Glucose-Capillary 122 (H) 70 - 99 mg/dL    Comment: Glucose reference range applies only to samples taken after fasting for at least 8 hours.   Comment 1 Notify RN    Comment 2 Document in Chart   Basic metabolic panel with GFR     Status: Abnormal   Collection Time: 03/31/24  5:01 AM  Result Value Ref Range   Sodium 139 135 - 145 mmol/L   Potassium 3.9 3.5 - 5.1 mmol/L   Chloride 102 98 - 111 mmol/L   CO2 27 22 - 32 mmol/L   Glucose, Bld 108 (H) 70 - 99 mg/dL    Comment: Glucose reference range applies only to samples taken after fasting for at least 8 hours.   BUN 15 8 - 23 mg/dL   Creatinine, Ser 9.31 0.61 - 1.24 mg/dL   Calcium  8.5 (L) 8.9 - 10.3 mg/dL   GFR,  Estimated >39 >39 mL/min    Comment: (NOTE) Calculated using the CKD-EPI Creatinine Equation (2021)    Anion gap 10 5 - 15    Comment: Performed at [facility], [address], [place], [place] [address]  Glucose, capillary     Status: Abnormal   Collection Time: 03/31/24  6:08 AM  Result Value Ref Range   Glucose-Capillary 112 (H) 70 - 99 mg/dL    Comment: Glucose reference range applies only to samples taken after fasting for at least 8 hours.   Comment 1 Notify RN    Comment 2 Document in Chart   Glucose, capillary     Status: Abnormal   Collection Time: 03/31/24  9:06 AM  Result Value Ref Range   Glucose-Capillary 170 (H) 70 - 99 mg/dL    Comment: Glucose reference range applies only to samples taken after fasting for at least 8 hours.  Glucose, capillary     Status: Abnormal   Collection Time: 03/31/24 11:26 AM  Result Value Ref Range   Glucose-Capillary 152 (H) 70 - 99 mg/dL    Comment: Glucose reference range applies only to samples taken after fasting for at least 8 hours.  Glucose, capillary     Status: Abnormal   Collection Time: 03/31/24  2:07 PM  Result Value Ref Range   Glucose-Capillary 195 (H) 70 - 99 mg/dL    Comment: Glucose reference range applies only to samples taken after fasting for at least 8 hours.  Glucose, capillary     Status: Abnormal   Collection Time: 03/31/24  5:59 PM  Result Value Ref Range   Glucose-Capillary 110 (H) 70 - 99 mg/dL    Comment: Glucose reference range applies only to samples taken after fasting for at least 8 hours.  Glucose, capillary     Status: Abnormal   Collection Time: 03/31/24  9:03 PM  Result Value Ref Range   Glucose-Capillary 187 (H) 70 - 99 mg/dL    Comment: Glucose reference range applies only to samples taken after fasting for at least 8 hours.   Comment 1 Notify RN    Comment 2 Document in Chart   Glucose, capillary     Status: Abnormal   Collection Time: 04/01/24  5:57 AM  Result Value Ref Range    Glucose-Capillary 125 (H) 70 - 99 mg/dL    Comment: Glucose reference range applies only to samples taken after fasting for at least 8 hours.   Comment 1 Notify RN    Comment 2 Document in Chart      Treatments: surgery:  Median Sternotomy Extracorporeal circulation 3.   Coronary artery bypass grafting x 3   Left internal mammary artery graft to the LAD SVG to Ramus SVG to OM3   4.   Endoscopic vein harvest from the right leg by Dr. Lucas on 03/27/2024.  Discharge Exam: Blood pressure 127/74, pulse 67, temperature 98.7 F (37.1 C), temperature source Oral, resp. rate 19, height 5' 9 (1.753 m), weight 86.8 kg, SpO2 95%.    Disch General appearance: alert, cooperative, and no distress Heart: regular rate and rhythm Lungs: clear to auscultation bilaterally Abdomen: benign Extremities: minor lower ext edema Wound: incis healing wellarge Medications:  The patient has been discharged on:   1.Beta Blocker:  Yes [ x  ]                              No   [   ]                              If No, reason:  2.Ace Inhibitor/ARB: Yes [   ]                                     No  [    ]                                     If No, reason:  3.Statin:   Yes [ x  ]                  No  [   ]                  If No, reason:  4.Ecasa:  Yes  [  x ]                  No   [   ]                  If No, reason:  Patient had ACS upon admission:Y  Plavix/P2Y12 inhibitor: Yes [   ]                                      No  [ N  ]     Discharge Instructions     Amb Referral to Cardiac Rehabilitation   Complete by: As directed    Diagnosis: CABG   CABG X ___: 3   After initial evaluation and assessments completed: Virtual Based Care may be provided alone or in conjunction with Phase 2 Cardiac Rehab based on patient barriers.: Yes   Intensive Cardiac Rehabilitation (ICR) [facility] location only OR Traditional Cardiac Rehabilitation (TCR) *If criteria for ICR are not met will enroll in TCR  ([facility] only): Yes      Allergies as of 04/01/2024  Reactions   Codeine Itching   Quinolones Other (See Comments)   Ascending thoracic aortic aneurysm    Sulfa Antibiotics Other (See Comments)   Reactions: nausea, shaking of the body   Wheat Rash        Medication List     STOP taking these medications    amLODipine  10 MG tablet Commonly known as: NORVASC    nitroGLYCERIN  0.4 MG SL tablet Commonly known as: Nitrostat    valsartan 320 MG tablet Commonly known as: DIOVAN       TAKE these medications    acetaminophen  500 MG tablet Commonly known as: TYLENOL  Take 1,000 mg by mouth daily as needed for moderate pain (pain score 4-6) or mild pain (pain score 1-3).   allopurinol  300 MG tablet Commonly known as: ZYLOPRIM  Take 300 mg by mouth daily.   amiodarone  400 MG tablet Commonly known as: PACERONE  Take 1 tablet (400 mg total) by mouth 2 (two) times daily. For 5 days then take 400 mg once daily   aspirin  EC 81 MG tablet Take 1 tablet (81 mg total) by mouth daily. Swallow whole.   atorvastatin  10 MG tablet Commonly known as: LIPITOR TAKE 1 TABLET BY MOUTH EVERY DAY   carvedilol  6.25 MG tablet Commonly known as: COREG  Take 1 tablet (6.25 mg total) by mouth 2 (two) times daily.   clobetasol cream 0.05 % Commonly known as: TEMOVATE Apply 1 Application topically daily as needed (irritation).   Eliquis  5 MG Tabs tablet Generic drug: apixaban  TAKE 1 TABLET TWICE A DAY   Farxiga 5 MG Tabs tablet Generic drug: dapagliflozin propanediol Take 5 mg by mouth at bedtime.   FISH OIL PO Take 1 capsule by mouth daily.   furosemide  40 MG tablet Commonly known as: LASIX  Take 1 tablet (40 mg total) by mouth daily. Start taking on: April 02, 2024   ketoconazole 2 % cream Commonly known as: NIZORAL Apply 1 Application topically daily as needed for irritation.   One A Day Men 50 Plus Tabs Take 1 tablet by mouth 4 (four) times a week.   oxyCODONE  5 MG  immediate release tablet Commonly known as: Oxy IR/ROXICODONE  Take 1 tablet (5 mg total) by mouth every 6 (six) hours as needed for up to 7 days for severe pain (pain score 7-10).   potassium chloride  SA 20 MEQ tablet Commonly known as: KLOR-CON  M Take 1 tablet (20 mEq total) by mouth daily.   VITAMIN C PO Take 1 tablet by mouth 4 (four) times a week.        Follow-up Information     Kate Lonni CROME, MD. Go on 04/09/2024.   Specialties: Cardiology, Radiology Why: Appointment time is at 3:40 pm Contact information: [address] [place] [address] [phone]         Regino Slater, MD. Call.   Specialty: Family Medicine Why: for a follow up for 2-3 weeks regarding further surveillance of HGA1C 6.8 Contact information: [address] [place] [address] [phone]         [facility] Dept. of [employer] [employer]. Go on 04/09/2024.   Specialty: Cardiothoracic Surgery Why: Appointment is with nurse only for chest tube suture removal only. Appointment time is at 11:30 am Contact information: [address] [place]  [address] [phone]        Rutha Manuelita HERO, [place]. Go on 04/18/2024.   Specialties: Physician Assistant, Thoracic Surgery  Why: Please arrive by 1:00 pm in order to have a PA/LAT CXR taken PRIOR to office appointment. CXR located in the same building on the SECOND floor. Appointment time is at 2:00 pm Contact information: [address] [place] [address] [phone]                 Signed:  Lemond FORBES Cera, PA-C  04/01/2024, 9:39 AM  "

## 2024-03-27 NOTE — Anesthesia Procedure Notes (Signed)
 Central Venous Catheter Insertion Performed by: Keneth Lynwood POUR, MD, anesthesiologist Start/End12/23/2025 7:00 AM, 03/27/2024 7:15 AM Patient location: Pre-op. Preanesthetic checklist: patient identified, IV checked, site marked, risks and benefits discussed, surgical consent, monitors and equipment checked, pre-op evaluation, timeout performed and anesthesia consent Lidocaine  1% used for infiltration and patient sedated Hand hygiene performed  and maximum sterile barriers used  Catheter size: 8.5 Fr Total catheter length 80. MAC introducer Swan type:thermodilution PA Cath depth:50 Procedure performed using ultrasound to evaluate access site. Ultrasound Notes:relevant anatomy identified, ultrasound used to visualize needle entry, vessel patent under ultrasound and image(s) printed for medical record. Attempts: 1 Following insertion, line sutured and dressing applied. Post procedure assessment: blood return through all ports, free fluid flow and no air  Patient tolerated the procedure well with no immediate complications.

## 2024-03-27 NOTE — Progress Notes (Signed)
" °  TCTS Evening Rounds:   Hemodynamically stable  CI = 2.4  Extubated and awake, neuro intact.  Urine output good  CT output low  CBC    Component Value Date/Time   WBC 12.1 (H) 03/27/2024 1331   RBC 4.18 (L) 03/27/2024 1331   HGB 15.0 03/27/2024 1546   HGB 17.5 03/09/2024 1251   HCT 44.0 03/27/2024 1546   HCT 52.2 (H) 03/09/2024 1251   PLT 108 (L) 03/27/2024 1331   PLT 179 03/09/2024 1251   MCV 98.8 03/27/2024 1331   MCV 102 (H) 03/09/2024 1251   MCH 34.0 03/27/2024 1331   MCHC 34.4 03/27/2024 1331   RDW 12.9 03/27/2024 1331   RDW 13.2 03/09/2024 1251   LYMPHSABS 0.8 02/28/2024 0814   LYMPHSABS 1.1 01/05/2023 1032   MONOABS 0.8 02/28/2024 0814   EOSABS 0.2 02/28/2024 0814   EOSABS 0.1 01/05/2023 1032   BASOSABS 0.0 02/28/2024 0814   BASOSABS 0.0 01/05/2023 1032     BMET    Component Value Date/Time   NA 140 03/27/2024 1546   NA 138 03/09/2024 1251   K 4.4 03/27/2024 1546   CL 106 03/27/2024 1200   CO2 22 03/27/2024 0453   GLUCOSE 190 (H) 03/27/2024 1200   BUN 17 03/27/2024 1200   BUN 18 03/09/2024 1251   CREATININE 0.60 (L) 03/27/2024 1200   CREATININE 0.68 02/28/2024 0814   CALCIUM  9.0 03/27/2024 0453   EGFR 90 03/09/2024 1251   GFRNONAA >60 03/27/2024 0453   GFRNONAA >60 02/28/2024 0814     A/P:  Stable postop course. Continue current plans  "

## 2024-03-27 NOTE — Anesthesia Procedure Notes (Signed)
 Procedure Name: Intubation Date/Time: 03/27/2024 7:37 AM  Performed by: Lamar Lucie DASEN, CRNAPre-anesthesia Checklist: Patient identified, Emergency Drugs available, Suction available and Patient being monitored Patient Re-evaluated:Patient Re-evaluated prior to induction Oxygen Delivery Method: Circle system utilized Preoxygenation: Pre-oxygenation with 100% oxygen Induction Type: IV induction Ventilation: Mask ventilation without difficulty and Oral airway inserted - appropriate to patient size Laryngoscope Size: Mac and 4 Grade View: Grade I Tube type: Oral Tube size: 8.0 mm Number of attempts: 1 Airway Equipment and Method: Stylet and Oral airway Placement Confirmation: ETT inserted through vocal cords under direct vision, positive ETCO2 and breath sounds checked- equal and bilateral Secured at: 23 cm Tube secured with: Tape Dental Injury: Teeth and Oropharynx as per pre-operative assessment

## 2024-03-27 NOTE — Brief Op Note (Signed)
 03/23/2024 - 03/27/2024  9:27 AM  PATIENT:  Randy Evans  83 y.o. male  PRE-OPERATIVE DIAGNOSIS:  CORONARY ARTERY DISEASE  POST-OPERATIVE DIAGNOSIS:  CORONARY ARTERY DISEASE  PROCEDURE: INTRAOPERATIVE TRANSESOPHAGEAL ECHOCARDIOGRAM,  CORONARY ARTERY BYPASS GRAFTING TIMES THREE (LIMA to LAD, SVG to DISTAL OM, SVG to RAMUS INTERMEDIATE)  USING LEFT INTERNAL MAMMARY ARTERY AND ENDOSCOPIC HARVESTED RIGHT GREATER SAPHAENOUS VEIN   Vein harvest time: 20 min Vein prep time: 14 min  SURGEON:  Surgeons and Role:    Lucas Dorise POUR, MD - Primary  PHYSICIAN ASSISTANT: Kyla Donald PA-C  ASSISTANT: Mariel Mayotte RNFA  ANESTHESIA:   general  EBL:  Per anesthesia, perfusion record  DRAINS: Chest tubes placed in the mediastinal and pleural spaces    COUNTS CORRECT:  YES  DICTATION: .Dragon Dictation  PLAN OF CARE: Admit to inpatient   PATIENT DISPOSITION:  ICU - intubated and hemodynamically stable.   Delay start of Pharmacological VTE agent (>24hrs) due to surgical blood loss or risk of bleeding: no  BASELINE WEIGHT: 85.7 kg

## 2024-03-27 NOTE — Anesthesia Preprocedure Evaluation (Signed)
"                                    Anesthesia Evaluation  Patient identified by MRN, date of birth, ID band Patient awake    Reviewed: Allergy & Precautions, NPO status , Patient's Chart, lab work & pertinent test results, reviewed documented beta blocker date and time   History of Anesthesia Complications Negative for: history of anesthetic complications  Airway Mallampati: II  TM Distance: >3 FB     Dental no notable dental hx.    Pulmonary neg COPD, former smoker   breath sounds clear to auscultation       Cardiovascular hypertension, + angina  + CAD  + dysrhythmias (-) pacemaker(-) Valvular Problems/Murmurs Rhythm:Regular Rate:Normal  IMPRESSIONS     1. Left ventricular ejection fraction, by estimation, is 55 to 60%. Left  ventricular ejection fraction by 3D volume is 65 %. The left ventricle has  normal function. The left ventricle has no regional wall motion  abnormalities. There is mild left  ventricular hypertrophy. Left ventricular diastolic parameters are  consistent with Grade III diastolic dysfunction (restrictive). Elevated  left atrial pressure.   2. Right ventricular systolic function is low normal. The right  ventricular size is normal. There is moderately elevated pulmonary artery  systolic pressure. The estimated right ventricular systolic pressure is  56.9 mmHg.   3. Left atrial size was moderately dilated.   4. Right atrial size was mildly dilated.   5. The mitral valve is normal in structure. Trivial mitral valve  regurgitation. No evidence of mitral stenosis.   6. The aortic valve is tricuspid. Aortic valve regurgitation is trivial.  No aortic stenosis is present.   7. Aortic dilatation noted. There is dilatation of the ascending aorta,  measuring 44 mm.   Coronary angiography 03/20/2024: LM: Distal, eccentric 60% calcified stenosis LAD: Prox 90%, prox-mid 70% stenoses          Proximal septal 80% stenosis Ramus: Medium to large  caliber vessel              Prox 20% disease Lcx: Large vessel. No significant disease RCA: Dominant vessel. Prox 40% disease     Neuro/Psych neg Seizures    GI/Hepatic ,,,(+) neg Cirrhosis        Endo/Other    Renal/GU Renal disease     Musculoskeletal   Abdominal   Peds  Hematology  (+) Blood dyscrasia thrombocytopenia   Anesthesia Other Findings   Reproductive/Obstetrics                              Anesthesia Physical Anesthesia Plan  ASA: 4  Anesthesia Plan: General   Post-op Pain Management:    Induction: Intravenous  PONV Risk Score and Plan: 2 and Ondansetron  and Dexamethasone  Airway Management Planned: Oral ETT  Additional Equipment: Arterial line, CVP, PA Cath, 3D TEE and Ultrasound Guidance Line Placement  Intra-op Plan:   Post-operative Plan: Post-operative intubation/ventilation  Informed Consent: I have reviewed the patients History and Physical, chart, labs and discussed the procedure including the risks, benefits and alternatives for the proposed anesthesia with the patient or authorized representative who has indicated his/her understanding and acceptance.     Dental advisory given  Plan Discussed with: CRNA  Anesthesia Plan Comments:          Anesthesia Quick Evaluation  "

## 2024-03-27 NOTE — Op Note (Signed)
 "                                                                                            CARDIOVASCULAR SURGERY OPERATIVE NOTE  03/27/2024  Surgeon:  [name], MD  First Assistant: Kyla Donald,  PA-C:   An experienced assistant was required given the complexity of this surgery and the standard of surgical care. The assistant was needed for endoscopic vein harvest, exposure, dissection, suctioning, retraction of delicate tissues and sutures, instrument exchange and for overall help during this procedure.   Preoperative Diagnosis:  Severe left main and multi-vessel coronary artery disease   Postoperative Diagnosis:  Same   Procedure:  Median Sternotomy Extracorporeal circulation 3.   Coronary artery bypass grafting x 3  Left internal mammary artery graft to the LAD SVG to Ramus SVG to OM3  4.   Endoscopic vein harvest from the right leg   Anesthesia:  General Endotracheal   Clinical History/Surgical Indication:   He presented with unstable anginal symptoms and cath shows high grade distal LM stenosis involving the ostium of the LAD, large ramus and LCX. There is 90% proximal followed by 70% proximal to mid stenosis in the LAD. Echo shows normal LV systolic function with no significant valvular disease. I agree with need for CABG. I discussed the operative procedure with the patient including alternatives, benefits and risks; including but not limited to bleeding, blood transfusion, infection, stroke, myocardial infarction, graft failure, heart block requiring a permanent pacemaker, organ dysfunction, and death.  Randy Evans understands and agrees to proceed.    Preparation:  The patient was seen in the preoperative holding area and the correct patient, correct operation were confirmed with the patient after reviewing the medical record and catheterization. The consent was signed by me. Preoperative antibiotics were given. A pulmonary arterial line and radial  arterial line were placed by the anesthesia team. The patient was taken back to the operating room and positioned supine on the operating room table. After being placed under general endotracheal anesthesia by the anesthesia team a foley catheter was placed. The neck, chest, abdomen, and both legs were prepped with betadine soap and solution and draped in the usual sterile manner. A surgical time-out was taken and the correct patient and operative procedure were confirmed with the nursing and anesthesia staff.   Cardiopulmonary Bypass:  A median sternotomy was performed. The pericardium was opened in the midline. Right ventricular function appeared normal. The ascending aorta was of normal size and had no palpable plaque. There were no contraindications to aortic cannulation or cross-clamping. The patient was fully systemically heparinized and the ACT was maintained > 400 sec. The proximal aortic arch was cannulated with a 20 F aortic cannula for arterial inflow. Venous cannulation was performed via the right atrial appendage using a two-staged venous cannula. An antegrade cardioplegia/vent cannula was inserted into the mid-ascending aorta. Aortic occlusion was performed with a single cross-clamp. Systemic cooling to 32 degrees Centigrade and topical cooling of the heart with iced saline were used. Hyperkalemic antegrade cold blood cardioplegia was used to induce diastolic arrest and was then given  at about 20 minute intervals throughout the period of arrest to maintain myocardial temperature at or below 10 degrees centigrade. A temperature probe was inserted into the interventricular septum and an insulating pad was placed in the pericardium.   Left internal mammary artery harvest:  The left side of the sternum was retracted using the Rultract retractor. The left internal mammary artery was harvested as a pedicle graft. All side branches were clipped. It was a medium-sized vessel of good quality with  excellent blood flow. It was ligated distally and divided. It was sprayed with topical papaverine solution to prevent vasospasm.   Endoscopic vein harvest: Performed by Kyla Donald, PA-C  The right greater saphenous vein was harvested endoscopically through a 2 cm incision medial to the right knee. It was harvested from the upper thigh to below the knee. It was a medium-sized vein of good quality. The side branches were all ligated with 4-0 silk ties.    Coronary arteries:  The coronary arteries were examined.  LAD:  Medium caliber vessel that was diseased throughout the proximal and mid portions but distal vessel graftable. LCX:  Large intramyocardial Ramus that was located in the mid portion and free of disease. The OM1 was small, OM2 and OM3 were large. RCA:  no stenosis on cath.   Grafts: Assisted by Kyla Donald, PA-C  LIMA to the LAD: 1.75 mm. It was sewn end to side using 8-0 prolene continuous suture. SVG to Ramus:  2.5 mm. It was sewn end to side using 7-0 prolene continuous suture. SVG to OM3:  2.0 mm. It was sewn end to side using 7-0 prolene continuous suture.   The proximal vein graft anastomoses were performed to the mid-ascending aorta using continuous 6-0 prolene suture. Graft markers were placed around the proximal anastomoses.   Completion:  The patient was rewarmed to 37 degrees Centigrade. The clamp was removed from the LIMA pedicle and there was rapid warming of the septum and return of ventricular fibrillation. The crossclamp was removed with a time of 70 minutes. There was spontaneous return of sinus rhythm. The distal and proximal anastomoses were checked for hemostasis. The position of the grafts was satisfactory. Two temporary epicardial pacing wires were placed on the right atrium and two on the right ventricle. The patient was weaned from CPB without difficulty on no inotropes. CPB time was 90 minutes. Cardiac output was 4.5 LPM. TEE showed  unchanged LV systolic function with an EF of 45-50%, unchanged mild MR. Heparin  was fully reversed with protamine  and the aortic and venous cannulas removed. Hemostasis was achieved. Mediastinal and left pleural drainage tubes were placed. The sternum was closed with double #6 stainless steel wires. The fascia was closed with continuous # 1 vicryl suture. The subcutaneous tissue was closed with 2-0 vicryl continuous suture. The skin was closed with 3-0 vicryl subcuticular suture. All sponge, needle, and instrument counts were reported correct at the end of the case. Dry sterile dressings were placed over the incisions and around the chest tubes which were connected to pleurevac suction. The patient was then transported to the surgical intensive care unit in stable condition.    "

## 2024-03-27 NOTE — Progress Notes (Signed)
.. ° ° °  PROCEDURAL EXPEDITER PROGRESS NOTE  Patient Name: Randy Evans  DOB:1940-07-20 Date of Admission: 03/23/2024  Date of Assessment:03/27/2024   -------------------------------------------------------------------------------------------------------------------   Brief clinical summary: Pt to the OR today for CABG  Orders in place:  Yes   Communication with surgical team if no orders: N/A  Labs, test, and orders reviewed: Y  Requires surgical clearance:  No  Barriers noted: N/A   Intervention provided by [facility] team: N/A  Barrier resolved:  not applicable   -------------------------------------------------------------------------------------------------------------------  [employer], [place], [place] Please contact us  directly via secure chat (search for [facility]) or by calling us  at [phone] [facility]).

## 2024-03-27 NOTE — Hospital Course (Addendum)
 HPI: History of Present Illness:     This is an 83 year old male with a past medical history of paroxysmal atrial fibrillation/flutter, very remote tobacco abuse (quit 1966), hypertension, hyperlipidemia, diabetes mellitus, ATAA 4.2 cm (by CTA 12/2023), hemochromatosis, gout, cerebral aneurysm (s/p coil embolization 06') who presented today to [facility] ED with complaints of chest pain, despite taking Nitroglycerin . Per patient, he had dizziness and fatigue that started around October of this year. He was seen by his cardiologist, Dr. Kate and a workup was undertaken. More recently, patient had chest discomfort/tightness, which occurred 12/16 Tuesday evening, and he took a Nitroglycerin  with relief. On Wednesday 12/17 , he left his hat in short stay (had cardiac catheterization done earlier in the day) and upon walking in to get his hat, he had chest pain. Again, he took a Nitroglycerin  which relieved his pain. Thursday night 12/18, upon going to bed,  chest pain occurred again and again was relieved with Nitroglycerin . Ultimately, he had several more episodes of chest pain and took Nitroglycerin . He called Dr. Alvan office earlier this am 12/19 and was advised to go to the ED. He had a cardiac MRI October 2024 that showed LVEF to be 56%, mild hypokinesis of mid inferolateral wall,  basal and mid inferolateral wall LGE subendocardial and mid myocardial (most consistent with prior infarct), no evidence of myocardial iron overload,  mild dilation of sinus of Valsalva, 43 mm, mid ascending aorta 42 mm, and proximal descending aorta 36 mm, and mild mitral regurgitation. Stress PET December 2025 showed LAD ischemia, reduced myocardial blood flow reserve, high risk study. Echocardiogram done 03/20/2024 showed LVEF 55-60%, the left ventricle has no regional wall motion abnormalities, trivial MR and AI, and dilatation of the ascending aorta measuring 44 mm. Cardiac catheterization done on 03/20/2024 showed 60% left  main disease, proximal 90% LAD and proximal septal 80% stenosis, no significant disease in the Circumflex and 40% in the RCA.  Surgeon discussed the need for coronary artery bypass grafting surgery. Potential risks, benefits, and complications of the surgery were discussed with the patient and he agreed to proceed with surgery. Pre operative carotid duplex US  showed no significant internal carotid artery stenosis bilaterally.  Hospital Course: Patient underwent a CABG x 3. He was transported from the OR to [facility] ICU in stable condition. He was extubated the afternoon of surgery. Norva Purl, a line, and chest tubes were removed on POD 1. He was restarted on Coreg  (as taken prior to surgery). On EKG, he had bifascicular block so EPW remained a few days. He was transitioned off the Insulin  drip. His pre op HGA1C was 6.8. He will be restarted on Farxiga later in his hospital course. He has a history of paroxysmal atrial fibrillation and will be restarted on Apixaban . Amiodarone  protocol was started.  He was converted to oral regimen of amiodarone .  His pacing wires were removed without difficulty.  He was felt stable for transfer to the progressive care unit on 03/30/2024.  He will be resumed on Eliquis  prior to discharge.  He is continued making excellent progress.  He is diuresing well but will require some further as an outpatient.  Incisions are healing well without evidence of infection.  He is tolerating diet and routine activities.  Suggested weaning maintains good saturations on room air.  He is maintaining sinus rhythm.  Overall at the time of discharge the patient felt quite stable.

## 2024-03-27 NOTE — Procedures (Signed)
 Extubation Procedure Note  Patient Details:   Name: Randy Evans DOB: 06/24/1940 MRN: [id number]   Airway Documentation:    Vent end date: 03/27/24 Vent end time: 1500   Evaluation  O2 sats: stable throughout Complications: No apparent complications Patient did tolerate procedure well. Bilateral Breath Sounds: Clear   Yes  Pt achieved NIF of -25 and VC of 1.1. with great pt effort on all attempts.   Pt extubated to 2L [place], per protocol. Pt tolerated well, cuff leak present, RN at bedside.     Waddell CHRISTELLA Hint 03/27/2024, 3:31 PM

## 2024-03-27 NOTE — Anesthesia Postprocedure Evaluation (Signed)
"   Anesthesia Post Note  Patient: Randy Evans  Procedure(s) Performed: CORONARY ARTERY BYPASS GRAFTING TIMES THREE, USING LEFT INTERNAL MAMMARY ARTERY AND ENDOSCOPIC HARVESTED RIGHT GREATER SAPHAENOUS VEIN (Chest) ECHOCARDIOGRAM, TRANSESOPHAGEAL, INTRAOPERATIVE     Patient location during evaluation: ICU Anesthesia Type: General Level of consciousness: sedated Pain management: pain level controlled Vital Signs Assessment: post-procedure vital signs reviewed and stable Respiratory status: patient remains intubated per anesthesia plan and patient on ventilator - see flowsheet for VS Cardiovascular status: stable Postop Assessment: no apparent nausea or vomiting Anesthetic complications: no   No notable events documented.  Last Vitals:  Vitals:   03/27/24 1510 03/27/24 1515  BP:    Pulse: 80 80  Resp: 19 20  Temp: 36.7 C 36.7 C  SpO2: 93% 93%    Last Pain:  Vitals:   03/27/24 1637  TempSrc:   PainSc: 6                  Randy Evans      "

## 2024-03-27 NOTE — Plan of Care (Signed)

## 2024-03-27 NOTE — Plan of Care (Signed)
" °  Problem: Education: Goal: Understanding of cardiac disease, CV risk reduction, and recovery process will improve Outcome: Progressing Goal: Individualized Educational Video(s) Outcome: Progressing   Problem: Activity: Goal: Ability to tolerate increased activity will improve Outcome: Progressing   Problem: Cardiac: Goal: Ability to achieve and maintain adequate cardiovascular perfusion will improve Outcome: Progressing   Problem: Health Behavior/Discharge Planning: Goal: Ability to safely manage health-related needs after discharge will improve Outcome: Progressing   Problem: Education: Goal: Ability to describe self-care measures that may prevent or decrease complications (Diabetes Survival Skills Education) will improve Outcome: Progressing Goal: Individualized Educational Video(s) Outcome: Progressing   Problem: Coping: Goal: Ability to adjust to condition or change in health will improve Outcome: Progressing   Problem: Fluid Volume: Goal: Ability to maintain a balanced intake and output will improve Outcome: Progressing   Problem: Health Behavior/Discharge Planning: Goal: Ability to identify and utilize available resources and services will improve Outcome: Progressing Goal: Ability to manage health-related needs will improve Outcome: Progressing   Problem: Metabolic: Goal: Ability to maintain appropriate glucose levels will improve Outcome: Progressing   Problem: Nutritional: Goal: Maintenance of adequate nutrition will improve Outcome: Progressing Goal: Progress toward achieving an optimal weight will improve Outcome: Progressing   Problem: Skin Integrity: Goal: Risk for impaired skin integrity will decrease Outcome: Progressing   Problem: Tissue Perfusion: Goal: Adequacy of tissue perfusion will improve Outcome: Progressing   Problem: Education: Goal: Knowledge of General Education information will improve Description: Including pain rating scale,  medication(s)/side effects and non-pharmacologic comfort measures Outcome: Progressing   Problem: Health Behavior/Discharge Planning: Goal: Ability to manage health-related needs will improve Outcome: Progressing   Problem: Clinical Measurements: Goal: Ability to maintain clinical measurements within normal limits will improve Outcome: Progressing Goal: Will remain free from infection Outcome: Progressing Goal: Diagnostic test results will improve Outcome: Progressing Goal: Respiratory complications will improve Outcome: Progressing Goal: Cardiovascular complication will be avoided Outcome: Progressing   Problem: Activity: Goal: Risk for activity intolerance will decrease Outcome: Progressing   Problem: Nutrition: Goal: Adequate nutrition will be maintained Outcome: Progressing   Problem: Coping: Goal: Level of anxiety will decrease Outcome: Progressing   Problem: Elimination: Goal: Will not experience complications related to bowel motility Outcome: Progressing Goal: Will not experience complications related to urinary retention Outcome: Progressing   Problem: Pain Managment: Goal: General experience of comfort will improve and/or be controlled Outcome: Progressing   Problem: Safety: Goal: Ability to remain free from injury will improve Outcome: Progressing   Problem: Skin Integrity: Goal: Risk for impaired skin integrity will decrease Outcome: Progressing   Problem: Education: Goal: Will demonstrate proper wound care and an understanding of methods to prevent future damage Outcome: Progressing Goal: Knowledge of disease or condition will improve Outcome: Progressing Goal: Knowledge of the prescribed therapeutic regimen will improve Outcome: Progressing Goal: Individualized Educational Video(s) Outcome: Progressing   Problem: Activity: Goal: Risk for activity intolerance will decrease Outcome: Progressing   Problem: Cardiac: Goal: Will achieve and/or  maintain hemodynamic stability Outcome: Progressing   Problem: Clinical Measurements: Goal: Postoperative complications will be avoided or minimized Outcome: Progressing   Problem: Respiratory: Goal: Respiratory status will improve Outcome: Progressing   Problem: Skin Integrity: Goal: Wound healing without signs and symptoms of infection Outcome: Progressing Goal: Risk for impaired skin integrity will decrease Outcome: Progressing   Problem: Urinary Elimination: Goal: Ability to achieve and maintain adequate renal perfusion and functioning will improve Outcome: Progressing   "

## 2024-03-27 NOTE — Transfer of Care (Signed)
 Immediate Anesthesia Transfer of Care Note  Patient: Randy Evans  Procedure(s) Performed: CORONARY ARTERY BYPASS GRAFTING TIMES THREE, USING LEFT INTERNAL MAMMARY ARTERY AND ENDOSCOPIC HARVESTED RIGHT GREATER SAPHAENOUS VEIN (Chest) ECHOCARDIOGRAM, TRANSESOPHAGEAL, INTRAOPERATIVE  Patient Location: ICU  Anesthesia Type:General  Level of Consciousness: sedated and Patient remains intubated per anesthesia plan  Airway & Oxygen Therapy: Patient remains intubated per anesthesia plan and Patient placed on Ventilator (see vital sign flow sheet for setting)  Post-op Assessment: Report given to RN and Post -op Vital signs reviewed and stable  Post vital signs: Reviewed and stable  Last Vitals:  Vitals Value Taken Time  BP 109/62   Temp 36.7 C 03/27/24 13:14  Pulse 80 03/27/24 13:14  Resp 16 03/27/24 13:14  SpO2 92 % 03/27/24 13:14  Vitals shown include unfiled device data.  Last Pain:  Vitals:   03/27/24 0645  TempSrc:   PainSc: 0-No pain      Patients Stated Pain Goal: 0 (03/27/24 0645)  Complications: No notable events documented.  Patient transported to ICU with standard monitors (HR, BP, SPO2, RR) and emergency drugs/equipment. Controlled ventilation maintained via ambu bag. Report given to bedside RN and respiratory therapist. Pt connected to ICU monitor and ventilator. All questions answered and vital signs stable before leaving. Dr. Keneth at bedside

## 2024-03-27 NOTE — Interval H&P Note (Signed)
 History and Physical Interval Note:  03/27/2024 6:20 AM  Randy Evans  has presented today for surgery, with the diagnosis of CAD.  The various methods of treatment have been discussed with the patient and family. After consideration of risks, benefits and other options for treatment, the patient has consented to  Procedures: CORONARY ARTERY BYPASS GRAFTING (CABG) (N/A) ECHOCARDIOGRAM, TRANSESOPHAGEAL, INTRAOPERATIVE (N/A) as a surgical intervention.  The patient's history has been reviewed, patient examined, no change in status, stable for surgery.  I have reviewed the patient's chart and labs.  Questions were answered to the patient's satisfaction.     Allen Egerton K Jaber Dunlow

## 2024-03-27 NOTE — Consult Note (Signed)
 "  NAME:  Randy Evans, MRN:  [id number], DOB:  [birth year], LOS: 4 ADMISSION DATE:  03/23/2024, CONSULTATION DATE:  03/27/24 REFERRING MD:  Lucas NOVAK, CHIEF COMPLAINT:  s/p CABG x3   History of Present Illness:  Randy Evans is a 83 yo male with past medical history significant for AF s/p multiple ablations, CAD w/ MVD, cerebral aneurysm s/p coil embolization, hemochromatosis, gout, DM2, HTN who initially underwent cardiac cath 12/16 with multivessel CAD, planned for outpatient CABG evaluation, however developed worsening chest pain and presented to the ED 12/22. Patient underwent CABG x3 with Dr. Lucas. PCCM consulted for post-op medical management.  Intra-op: 700 cellsaver CBP time: 90 minutes  Pertinent Medical History:   Past Medical History:  Diagnosis Date   Atrial fibrillation (HCC)    Onset 1991 in [place]    Cerebral aneurysm without rupture    treated with coil embolization 2006   Gout    Hemochromatosis    Has seen Norleen Hint   Hypertension    Significant Hospital Events: Including procedures, antibiotic start and stop dates in addition to other pertinent events   12/22: ED worsening chest pain 12/23: CABG x3 w/ Dr. Lucas  Interim History / Subjective:  PCCM consulted for post op management  Objective    Blood pressure 124/74, pulse (!) 59, temperature 98.4 F (36.9 C), temperature source Oral, resp. rate 13, height 5' 9 (1.753 m), weight 85.7 kg, SpO2 96%. PAP: (46-53)/(9-16) 46/12      Intake/Output Summary (Last 24 hours) at 03/27/2024 1306 Last data filed at 03/27/2024 1244 Gross per 24 hour  Intake 2060 ml  Output 2205 ml  Net -145 ml   Filed Weights   03/26/24 0547 03/27/24 0500 03/27/24 0634  Weight: 85.9 kg 85.6 kg 85.7 kg   Examination: Post-op exam General: acute on chronically-ill male post-op, in NAD, intubated and sedated HEENT: AT/[place], PERRL, 2mm bilaterally Pulm: ETT, ventilator-assisted breaths CV: AAI paced @80 ; CT in place with  sanguineous output, sternal dressing without strikethrough, c/d/i GI: soft, non distended, non tender to palpation Neuro: GCS3TS E1 V1 M1 , intubated sedated   Resolved Problem List:   Assessment and Plan:   Multivessel CAD s/p CABG x3 (L internal mammary to LAD; SVG to Ramus; SVG to OM3) -TCTS Dr. Lucas -Con't weaning pressors as able to maintain MAP >70-90. Albumin  boluses prn for low SV.  -Avoid acidemia, coagulopathy, hypocalcemia, hypothermia -Insulin  gtt for BG 100-180 -Tele monitoring/ pacing prn -Continue aspirin /statin -Continue metoprolol  -Mediastinal and left pleural CT per TCTS -MMPR per protocol- oxycodone , tramadol , morphine  with bowel regimen -Complete post-op antibiotics -Monitor electrolytes, replete PRN  Post bypass vasoplegia -Wean vasopressors for MAP 70-90, albumin  prn   Post-operative vent management -Rapid wean per TCTS protocol -VAP/ PPI -CXR/ ABG now  Expected post-operative ABLA Expected post-operative consumptive thrombocytopenia  -Transfuse for hgb <8 or significant bleeding  -Correct coagulopathy with significant bleeding    Paroxsymal Afib s/p prior ablations (most recent 2020 @ [facility]) -Hold AC post-op  Ascending aortic aneurysm -CTA 12/2023 42mm  Hemochromatosis-stable -01/2023 cardiac MRI no evidence of iron overload -Follows hematology outpatient  DM2 -On insulin  gtt post-op per TCTS -A1c 6.8  Labs:  CBC: Recent Labs  Lab 03/23/24 1118 03/24/24 0238 03/25/24 0336 03/26/24 0342 03/27/24 0453 03/27/24 0801 03/27/24 1100 03/27/24 1112 03/27/24 1124 03/27/24 1156 03/27/24 1200  WBC 9.0 8.2 6.7 6.6 6.7  --   --   --   --   --   --  HGB 16.9 15.7 15.4 15.7 15.4   < > 11.9* 12.0* 11.2* 12.6* 12.2*  HCT 50.1 45.6 44.7 45.7 44.1   < > 35.0* 34.7* 33.0* 37.0* 36.0*  MCV 100.0 100.0 97.2 99.1 98.0  --   --   --   --   --   --   PLT 154 129* 131* 135* 126*  --   --  100*  --   --   --    < > = values in this interval not  displayed.    Basic Metabolic Panel: Recent Labs  Lab 03/23/24 1118 03/24/24 0238 03/25/24 0336 03/26/24 0342 03/27/24 0453 03/27/24 0801 03/27/24 0812 03/27/24 0940 03/27/24 1014 03/27/24 1036 03/27/24 1100 03/27/24 1124 03/27/24 1156 03/27/24 1200  NA 136 136 138 136 137   < > 139 138   < > 138 138 137 137 137  K 4.5 4.3 4.0 4.0 4.3   < > 4.4 4.5   < > 4.9 5.3* 5.8* 5.3* 5.3*  CL 103 103 104 103 105  --  105 105  --  103  --  105  --  106  CO2 23 22 23 22 22   --   --   --   --   --   --   --   --   --   GLUCOSE 204* 108* 112* 142* 138*  --  156* 159*  --  144*  --  173*  --  190*  BUN 20 20 19  24* 20  --  19 19  --  18  --  17  --  17  CREATININE 0.72 0.70 0.70 0.76 0.65  --  0.60* 0.60*  --  0.60*  --  0.60*  --  0.60*  CALCIUM  9.1 8.4* 8.6* 8.7* 9.0  --   --   --   --   --   --   --   --   --    < > = values in this interval not displayed.   GFR: Estimated Creatinine Clearance: 78.6 mL/min (A) (by C-G formula based on SCr of 0.6 mg/dL (L)). Recent Labs  Lab 03/24/24 0238 03/25/24 0336 03/26/24 0342 03/27/24 0453  WBC 8.2 6.7 6.6 6.7    Liver Function Tests: No results for input(s): AST, ALT, ALKPHOS, BILITOT, PROT, ALBUMIN  in the last 168 hours. No results for input(s): LIPASE, AMYLASE in the last 168 hours. No results for input(s): AMMONIA in the last 168 hours.  ABG    Component Value Date/Time   PHART 7.354 03/27/2024 1156   PCO2ART 39.6 03/27/2024 1156   PO2ART 154 (H) 03/27/2024 1156   HCO3 22.0 03/27/2024 1156   TCO2 22 03/27/2024 1200   ACIDBASEDEF 3.0 (H) 03/27/2024 1156   O2SAT 99 03/27/2024 1156     Coagulation Profile: No results for input(s): INR, PROTIME in the last 168 hours.  Cardiac Enzymes: No results for input(s): CKTOTAL, CKMB, CKMBINDEX, TROPONINI in the last 168 hours.  HbA1C: Hgb A1c MFr Bld  Date/Time Value Ref Range Status  03/24/2024 02:38 AM 6.8 (H) 4.8 - 5.6 % Final    Comment:     (NOTE) Diagnosis of Diabetes The following HbA1c ranges recommended by the American Diabetes Association (ADA) may be used as an aid in the diagnosis of diabetes mellitus.  Hemoglobin             Suggested A1C NGSP%  Diagnosis  <5.7                   Non Diabetic  5.7-6.4                Pre-Diabetic  >6.4                   Diabetic  <7.0                   Glycemic control for                       adults with diabetes.      CBG: Recent Labs  Lab 03/26/24 0611 03/26/24 1110 03/26/24 1718 03/26/24 2056 03/27/24 0559  GLUCAP 170* 125* 118* 166* 142*    Review of Systems:   Unable to assess; patient post-op intubated/sedated  Past Medical History:  He,  has a past medical history of Atrial fibrillation (HCC), Cerebral aneurysm without rupture, Gout, Hemochromatosis, and Hypertension.   Surgical History:   Past Surgical History:  Procedure Laterality Date   ANEURYSM COILING  2006   bone spur removal  1951   below left knee   BRAIN SURGERY     CARDIAC ELECTROPHYSIOLOGY MAPPING AND ABLATION  01/2008; 07/2008   CARDIOVERSION  2000; 2008 03/2008; ;02/15/2011   2008; 2009 2012:    Surgeon: LELON Jacques Blanca Mickey., MD;  Location: [facility] OR;  Service: Cardiovascular;  Laterality: N/A;   CARDIOVERSION  08/05/2011   Procedure: CARDIOVERSION;  Surgeon: Elsie GORMAN Blanca, MD;  Location: [facility] OR;  Service: Cardiovascular;  Laterality: N/A;   CATARACT EXTRACTION W/ INTRAOCULAR LENS  IMPLANT, BILATERAL  2009-2010   IR ANGIO INTRA EXTRACRAN SEL COM CAROTID INNOMINATE UNI L MOD SED  11/09/2016   IR ANGIO INTRA EXTRACRAN SEL COM CAROTID INNOMINATE UNI R MOD SED  07/17/2019   IR ANGIO INTRA EXTRACRAN SEL INTERNAL CAROTID UNI R MOD SED  11/09/2016   IR US  GUIDE VASC ACCESS RIGHT  07/17/2019   LEFT HEART CATH AND CORONARY ANGIOGRAPHY N/A 03/20/2024   Procedure: LEFT HEART CATH AND CORONARY ANGIOGRAPHY;  Surgeon: Elmira Newman PARAS, MD;  Location: [facility] INVASIVE CV LAB;  Service:  Cardiovascular;  Laterality: N/A;   LIVER BIOPSY  late 1990's   TOE SURGERY       Social History:   reports that he has quit smoking. His smoking use included cigarettes, pipe, and cigars. He has never used smokeless tobacco. He reports current alcohol use of about 2.0 standard drinks of alcohol per week. He reports that he does not use drugs.   Family History:  His family history is not on file.   Allergies Allergies[1]   Home Medications  Prior to Admission medications  Medication Sig Start Date End Date Taking? Authorizing Provider  acetaminophen  (TYLENOL ) 500 MG tablet Take 1,000 mg by mouth daily as needed for moderate pain (pain score 4-6) or mild pain (pain score 1-3).   Yes [provider]  allopurinol  (ZYLOPRIM ) 300 MG tablet Take 300 mg by mouth daily.     Yes [provider]  amLODipine  (NORVASC ) 10 MG tablet TAKE 1 TABLET DAILY 06/30/23  Yes Waddell Danelle LELON, MD  apixaban  (ELIQUIS ) 5 MG TABS tablet TAKE 1 TABLET TWICE A DAY 06/29/23  Yes Kate Lonni CROME, MD  Ascorbic Acid (VITAMIN C PO) Take 1 tablet by mouth 4 (four) times a week.   Yes [provider]  atorvastatin  (LIPITOR) 10 MG  tablet TAKE 1 TABLET BY MOUTH EVERY DAY 01/24/24  Yes Kate Lonni CROME, MD  carvedilol  (COREG ) 6.25 MG tablet Take 1 tablet (6.25 mg total) by mouth 2 (two) times daily. 12/13/23  Yes Kate Lonni CROME, MD  clobetasol cream (TEMOVATE) 0.05 % Apply 1 Application topically daily as needed (irritation). 09/03/21  Yes [provider]  FARXIGA 5 MG TABS tablet Take 5 mg by mouth at bedtime. 08/23/23  Yes [provider]  ketoconazole (NIZORAL) 2 % cream Apply 1 Application topically daily as needed for irritation. 06/16/21  Yes [provider]  Multiple Vitamins-Minerals (ONE A DAY MEN 50 PLUS) TABS Take 1 tablet by mouth 4 (four) times a week.   Yes [provider]  nitroGLYCERIN  (NITROSTAT ) 0.4 MG SL tablet Place 1 tablet (0.4  mg total) under the tongue every 5 (five) minutes as needed for chest pain. 03/09/24  Yes Kate Lonni CROME, MD  Omega-3 Fatty Acids (FISH OIL PO) Take 1 capsule by mouth daily.   Yes [provider]  valsartan (DIOVAN) 320 MG tablet Take 320 mg by mouth daily. 07/27/20  Yes [provider]    Critical care time: 40 minutes   Alver Leete, DNP, AGACNP-BC [facility]  Please see [email or web address] for pager details.  From 7A-7P if no response, please call [phone]. After hours, please call ELink [phone].     [1]  Allergies Allergen Reactions   Codeine Itching   Quinolones Other (See Comments)    Ascending thoracic aortic aneurysm    Sulfa Antibiotics Other (See Comments)    Reactions: nausea, shaking of the body   Wheat Rash   "

## 2024-03-27 NOTE — Discharge Instructions (Signed)

## 2024-03-27 NOTE — Anesthesia Procedure Notes (Signed)
 Arterial Line Insertion Start/End12/23/2025 7:10 AM Performed by: Lamar Lucie DASEN, CRNA, CRNA  Patient location: Pre-op. Preanesthetic checklist: patient identified, IV checked, site marked, risks and benefits discussed, surgical consent, monitors and equipment checked, pre-op evaluation, timeout performed and anesthesia consent Lidocaine  1% used for infiltration Left, radial was placed Catheter size: 20 G Hand hygiene performed  and maximum sterile barriers used   Attempts: 2 Following insertion, dressing applied and Biopatch. Post procedure assessment: normal and unchanged

## 2024-03-28 ENCOUNTER — Inpatient Hospital Stay (HOSPITAL_COMMUNITY)

## 2024-03-28 ENCOUNTER — Encounter (HOSPITAL_COMMUNITY): Payer: Self-pay | Admitting: Surgery

## 2024-03-28 DIAGNOSIS — Z9889 Other specified postprocedural states: Secondary | ICD-10-CM

## 2024-03-28 DIAGNOSIS — I2510 Atherosclerotic heart disease of native coronary artery without angina pectoris: Secondary | ICD-10-CM | POA: Diagnosis not present

## 2024-03-28 DIAGNOSIS — E119 Type 2 diabetes mellitus without complications: Secondary | ICD-10-CM

## 2024-03-28 DIAGNOSIS — I480 Paroxysmal atrial fibrillation: Secondary | ICD-10-CM | POA: Diagnosis not present

## 2024-03-28 DIAGNOSIS — Z87891 Personal history of nicotine dependence: Secondary | ICD-10-CM | POA: Diagnosis not present

## 2024-03-28 DIAGNOSIS — Z951 Presence of aortocoronary bypass graft: Secondary | ICD-10-CM | POA: Diagnosis not present

## 2024-03-28 DIAGNOSIS — I7121 Aneurysm of the ascending aorta, without rupture: Secondary | ICD-10-CM | POA: Diagnosis not present

## 2024-03-28 LAB — BASIC METABOLIC PANEL WITH GFR
Anion gap: 9 (ref 5–15)
Anion gap: 10 (ref 5–15)
BUN: 15 mg/dL (ref 8–23)
BUN: 18 mg/dL (ref 8–23)
CO2: 21 mmol/L — ABNORMAL LOW (ref 22–32)
CO2: 23 mmol/L (ref 22–32)
Calcium: 8.3 mg/dL — ABNORMAL LOW (ref 8.9–10.3)
Calcium: 8.7 mg/dL — ABNORMAL LOW (ref 8.9–10.3)
Chloride: 105 mmol/L (ref 98–111)
Chloride: 99 mmol/L (ref 98–111)
Creatinine, Ser: 0.59 mg/dL — ABNORMAL LOW (ref 0.61–1.24)
Creatinine, Ser: 0.8 mg/dL (ref 0.61–1.24)
GFR, Estimated: >60 mL/min
GFR, Estimated: >60 mL/min
Glucose, Bld: 131 mg/dL — ABNORMAL HIGH (ref 70–99)
Glucose, Bld: 197 mg/dL — ABNORMAL HIGH (ref 70–99)
Potassium: 4.5 mmol/L (ref 3.5–5.1)
Potassium: 4.4 mmol/L (ref 3.5–5.1)
Sodium: 135 mmol/L (ref 135–145)
Sodium: 132 mmol/L — ABNORMAL LOW (ref 135–145)

## 2024-03-28 LAB — CBC
HCT: 41.7 % (ref 39.0–52.0)
HCT: 42.8 % (ref 39.0–52.0)
Hemoglobin: 14.4 g/dL (ref 13.0–17.0)
Hemoglobin: 14.3 g/dL (ref 13.0–17.0)
MCH: 33.7 pg (ref 26.0–34.0)
MCH: 34 pg (ref 26.0–34.0)
MCHC: 34.5 g/dL (ref 30.0–36.0)
MCHC: 33.4 g/dL (ref 30.0–36.0)
MCV: 97.7 fL (ref 80.0–100.0)
MCV: 101.7 fL — ABNORMAL HIGH (ref 80.0–100.0)
Platelets: 125 K/uL — ABNORMAL LOW (ref 150–400)
Platelets: 140 K/uL — ABNORMAL LOW (ref 150–400)
RBC: 4.27 MIL/uL (ref 4.22–5.81)
RBC: 4.21 MIL/uL — ABNORMAL LOW (ref 4.22–5.81)
RDW: 13.2 % (ref 11.5–15.5)
RDW: 13.3 % (ref 11.5–15.5)
WBC: 14.3 K/uL — ABNORMAL HIGH (ref 4.0–10.5)
WBC: 17.5 K/uL — ABNORMAL HIGH (ref 4.0–10.5)
nRBC: 0 % (ref 0.0–0.2)
nRBC: 0 % (ref 0.0–0.2)

## 2024-03-28 LAB — MAGNESIUM
Magnesium: 2.2 mg/dL (ref 1.7–2.4)
Magnesium: 2 mg/dL (ref 1.7–2.4)

## 2024-03-28 LAB — GLUCOSE, CAPILLARY
Glucose-Capillary: 128 mg/dL — ABNORMAL HIGH (ref 70–99)
Glucose-Capillary: 133 mg/dL — ABNORMAL HIGH (ref 70–99)
Glucose-Capillary: 140 mg/dL — ABNORMAL HIGH (ref 70–99)
Glucose-Capillary: 151 mg/dL — ABNORMAL HIGH (ref 70–99)
Glucose-Capillary: 152 mg/dL — ABNORMAL HIGH (ref 70–99)
Glucose-Capillary: 152 mg/dL — ABNORMAL HIGH (ref 70–99)
Glucose-Capillary: 154 mg/dL — ABNORMAL HIGH (ref 70–99)
Glucose-Capillary: 186 mg/dL — ABNORMAL HIGH (ref 70–99)
Glucose-Capillary: 196 mg/dL — ABNORMAL HIGH (ref 70–99)
Glucose-Capillary: 203 mg/dL — ABNORMAL HIGH (ref 70–99)

## 2024-03-28 MED ORDER — ENOXAPARIN SODIUM 40 MG/0.4ML IJ SOSY
40.0000 mg | PREFILLED_SYRINGE | Freq: Every day | INTRAMUSCULAR | Status: AC
  Administered 2024-03-28 – 2024-03-30 (×3): 40 mg via SUBCUTANEOUS
  Filled 2024-03-28 (×3): qty 0.4

## 2024-03-28 MED ORDER — INSULIN ASPART 100 UNIT/ML IJ SOLN
0.0000 [IU] | INTRAMUSCULAR | Status: DC
  Administered 2024-03-28: 2 [IU] via SUBCUTANEOUS
  Administered 2024-03-28 (×2): 4 [IU] via SUBCUTANEOUS
  Administered 2024-03-28: 8 [IU] via SUBCUTANEOUS
  Administered 2024-03-29: 4 [IU] via SUBCUTANEOUS
  Administered 2024-03-29: 8 [IU] via SUBCUTANEOUS
  Administered 2024-03-29: 4 [IU] via SUBCUTANEOUS
  Administered 2024-03-29 (×2): 2 [IU] via SUBCUTANEOUS
  Filled 2024-03-28 (×2): qty 4
  Filled 2024-03-28 (×2): qty 8
  Filled 2024-03-28 (×2): qty 2
  Filled 2024-03-28: qty 4
  Filled 2024-03-28: qty 2
  Filled 2024-03-28: qty 4

## 2024-03-28 MED ORDER — CARVEDILOL 3.125 MG PO TABS
3.1250 mg | ORAL_TABLET | Freq: Two times a day (BID) | ORAL | Status: DC
  Administered 2024-03-28: 3.125 mg via ORAL
  Filled 2024-03-28: qty 1

## 2024-03-28 MED ORDER — CARVEDILOL 6.25 MG PO TABS
6.2500 mg | ORAL_TABLET | Freq: Two times a day (BID) | ORAL | Status: DC
  Administered 2024-03-28 – 2024-04-01 (×8): 6.25 mg via ORAL
  Filled 2024-03-28 (×8): qty 1

## 2024-03-28 MED ORDER — INSULIN GLARGINE 100 UNIT/ML ~~LOC~~ SOLN
20.0000 [IU] | Freq: Every day | SUBCUTANEOUS | Status: DC
  Administered 2024-03-28 – 2024-04-01 (×5): 20 [IU] via SUBCUTANEOUS
  Filled 2024-03-28 (×5): qty 0.2

## 2024-03-28 MED ORDER — FUROSEMIDE 10 MG/ML IJ SOLN
40.0000 mg | Freq: Two times a day (BID) | INTRAMUSCULAR | Status: AC
  Administered 2024-03-28 (×2): 40 mg via INTRAVENOUS
  Filled 2024-03-28 (×2): qty 4

## 2024-03-28 MED ORDER — FENTANYL CITRATE (PF) 50 MCG/ML IJ SOSY
25.0000 ug | PREFILLED_SYRINGE | Freq: Once | INTRAMUSCULAR | Status: AC
  Administered 2024-03-28: 25 ug via INTRAVENOUS
  Filled 2024-03-28: qty 1

## 2024-03-28 MED FILL — Thrombin (Recombinant) For Soln 20000 Unit: CUTANEOUS | Qty: 1 | Status: AC

## 2024-03-28 NOTE — Progress Notes (Signed)
 EVENING ROUNDS NOTE :     [address] [employer] [address]             [phone]               1 Day Post-Op Procedures (LRB): CORONARY ARTERY BYPASS GRAFTING TIMES THREE, USING LEFT INTERNAL MAMMARY ARTERY AND ENDOSCOPIC HARVESTED RIGHT GREATER SAPHAENOUS VEIN (N/A) ECHOCARDIOGRAM, TRANSESOPHAGEAL, INTRAOPERATIVE (N/A)   Total Length of Stay:  LOS: 5 days  Events:   No events Stable day    BP 137/74   Pulse 75   Temp 99.5 F (37.5 C)   Resp (!) 21   Ht 5' 9 (1.753 m)   Wt 85 kg   SpO2 94%   BMI 27.67 kg/m   PAP: (42-61)/(21-35) 45/29 CO:  [4.9 L/min-6.5 L/min] 4.9 L/min CI:  [2.4 L/min/m2-3.24 L/min/m2] 2.4 L/min/m2      sodium chloride       ceFAZolin  (ANCEF ) IV Stopped (03/28/24 1501)   nitroGLYCERIN       I/O last 3 completed shifts: In: 4844.3 [P.O.:240; I.V.:2091.1; Blood:710; IV Piggyback:1803.3] Out: 4605 [Urine:2705; Blood:1480; Chest Tube:420]      Latest Ref Rng & Units 03/28/2024    4:38 PM 03/28/2024    4:40 AM 03/27/2024    9:30 PM  CBC  WBC 4.0 - 10.5 K/uL 17.5  14.3  13.8   Hemoglobin 13.0 - 17.0 g/dL 85.6  85.5  85.4   Hematocrit 39.0 - 52.0 % 42.8  41.7  41.7   Platelets 150 - 400 K/uL 140  125  118        Latest Ref Rng & Units 03/28/2024    4:38 PM 03/28/2024    4:40 AM 03/27/2024    9:30 PM  BMP  Glucose 70 - 99 mg/dL 802  868  828   BUN 8 - 23 mg/dL 18  15  16    Creatinine 0.61 - 1.24 mg/dL 9.19  9.40  9.42   Sodium 135 - 145 mmol/L 132  135  134   Potassium 3.5 - 5.1 mmol/L 4.4  4.5  4.1   Chloride 98 - 111 mmol/L 99  105  104   CO2 22 - 32 mmol/L 23  21  21    Calcium  8.9 - 10.3 mg/dL 8.7  8.3  8.0     ABG    Component Value Date/Time   PHART 7.341 (L) 03/27/2024 1546   PCO2ART 39.9 03/27/2024 1546   PO2ART 74 (L) 03/27/2024 1546   HCO3 21.6 03/27/2024 1546   TCO2 23 03/27/2024 1546   ACIDBASEDEF 4.0 (H) 03/27/2024 1546   O2SAT 94 03/27/2024 1546       Randy Rayas,  MD 03/28/2024 8:59 PM

## 2024-03-28 NOTE — Plan of Care (Signed)
" °  Problem: Education: Goal: Understanding of cardiac disease, CV risk reduction, and recovery process will improve Outcome: Progressing   Problem: Activity: Goal: Ability to tolerate increased activity will improve Outcome: Progressing   Problem: Cardiac: Goal: Ability to achieve and maintain adequate cardiovascular perfusion will improve Outcome: Progressing   Problem: Health Behavior/Discharge Planning: Goal: Ability to safely manage health-related needs after discharge will improve Outcome: Progressing   Problem: Education: Goal: Ability to describe self-care measures that may prevent or decrease complications (Diabetes Survival Skills Education) will improve Outcome: Progressing   Problem: Coping: Goal: Ability to adjust to condition or change in health will improve Outcome: Progressing   Problem: Fluid Volume: Goal: Ability to maintain a balanced intake and output will improve Outcome: Progressing   Problem: Health Behavior/Discharge Planning: Goal: Ability to identify and utilize available resources and services will improve Outcome: Progressing Goal: Ability to manage health-related needs will improve Outcome: Progressing   Problem: Metabolic: Goal: Ability to maintain appropriate glucose levels will improve Outcome: Progressing   Problem: Nutritional: Goal: Maintenance of adequate nutrition will improve Outcome: Progressing Goal: Progress toward achieving an optimal weight will improve Outcome: Progressing   Problem: Skin Integrity: Goal: Risk for impaired skin integrity will decrease Outcome: Progressing   Problem: Tissue Perfusion: Goal: Adequacy of tissue perfusion will improve Outcome: Progressing   Problem: Education: Goal: Knowledge of General Education information will improve Description: Including pain rating scale, medication(s)/side effects and non-pharmacologic comfort measures Outcome: Progressing   Problem: Health Behavior/Discharge  Planning: Goal: Ability to manage health-related needs will improve Outcome: Progressing   Problem: Clinical Measurements: Goal: Ability to maintain clinical measurements within normal limits will improve Outcome: Progressing Goal: Will remain free from infection Outcome: Progressing Goal: Diagnostic test results will improve Outcome: Progressing Goal: Respiratory complications will improve Outcome: Progressing Goal: Cardiovascular complication will be avoided Outcome: Progressing   Problem: Activity: Goal: Risk for activity intolerance will decrease Outcome: Progressing   Problem: Nutrition: Goal: Adequate nutrition will be maintained Outcome: Progressing   Problem: Coping: Goal: Level of anxiety will decrease Outcome: Progressing   Problem: Elimination: Goal: Will not experience complications related to bowel motility Outcome: Progressing Goal: Will not experience complications related to urinary retention Outcome: Progressing   Problem: Pain Managment: Goal: General experience of comfort will improve and/or be controlled Outcome: Progressing   Problem: Safety: Goal: Ability to remain free from injury will improve Outcome: Progressing   Problem: Skin Integrity: Goal: Risk for impaired skin integrity will decrease Outcome: Progressing   Problem: Education: Goal: Will demonstrate proper wound care and an understanding of methods to prevent future damage Outcome: Progressing Goal: Knowledge of disease or condition will improve Outcome: Progressing Goal: Knowledge of the prescribed therapeutic regimen will improve Outcome: Progressing   Problem: Activity: Goal: Risk for activity intolerance will decrease Outcome: Progressing   Problem: Cardiac: Goal: Will achieve and/or maintain hemodynamic stability Outcome: Progressing   Problem: Clinical Measurements: Goal: Postoperative complications will be avoided or minimized Outcome: Progressing   Problem:  Respiratory: Goal: Respiratory status will improve Outcome: Progressing   Problem: Skin Integrity: Goal: Wound healing without signs and symptoms of infection Outcome: Progressing Goal: Risk for impaired skin integrity will decrease Outcome: Progressing   Problem: Urinary Elimination: Goal: Ability to achieve and maintain adequate renal perfusion and functioning will improve Outcome: Progressing   "

## 2024-03-28 NOTE — Progress Notes (Signed)
 "  NAME:  Randy Evans, MRN:  [id number], DOB:  Jun 25, 1940, LOS: 5 ADMISSION DATE:  03/23/2024, CONSULTATION DATE:  03/27/24 REFERRING MD:  Lucas NOVAK, CHIEF COMPLAINT:  s/p CABG x3   History of Present Illness:  Randy Evans is a 83 yo male with past medical history significant for AF s/p multiple ablations, CAD w/ MVD, cerebral aneurysm s/p coil embolization, hemochromatosis, gout, DM2, HTN who initially underwent cardiac cath 12/16 with multivessel CAD, planned for outpatient CABG evaluation, however developed worsening chest pain and presented to the ED 12/22. Patient underwent CABG x3 with Dr. Lucas. PCCM consulted for post-op medical management.  Intra-op: 700 cellsaver CBP time: 90 minutes  Pertinent Medical History:   Past Medical History:  Diagnosis Date   Atrial fibrillation (HCC)    Onset 1991 in [place]    Cerebral aneurysm without rupture    treated with coil embolization 2006   Gout    Hemochromatosis    Has seen Norleen Hint   Hypertension    Significant Hospital Events: Including procedures, antibiotic start and stop dates in addition to other pertinent events   12/22: ED worsening chest pain 12/23: CABG x3 w/ Dr. Lucas 12/24: Patient improving, extubated yesterday afternoon, no events overnight, epicardial wires in place, chest tube removed along with PA catheter   Interim History / Subjective:  Patient improving   Hx of Afib with 4 ablations- currently sinus, but having ectopy   Objective    Blood pressure 132/81, pulse 76, temperature 98 F (36.7 C), temperature source Oral, resp. rate 17, height 5' 9 (1.753 m), weight 85 kg, SpO2 93%. PAP: (26-61)/(13-35) 45/29 CO:  [3.3 L/min-6.5 L/min] 4.9 L/min CI:  [1.6 L/min/m2-3.24 L/min/m2] 2.4 L/min/m2  Vent Mode: PSV;CPAP FiO2 (%):  [40 %-50 %] 40 % Set Rate:  [4 bmp-16 bmp] 4 bmp Vt Set:  [560 mL] 560 mL PEEP:  [5 cmH20] 5 cmH20 Pressure Support:  [10 cmH20] 10 cmH20   Intake/Output Summary (Last 24  hours) at 03/28/2024 1253 Last data filed at 03/28/2024 1000 Gross per 24 hour  Intake 2544.34 ml  Output 1825 ml  Net 719.34 ml   Filed Weights   03/27/24 0500 03/27/24 0634 03/28/24 0500  Weight: 85.6 kg 85.7 kg 85 kg   Examination: Post-op exam General: acute on chronic older adult male, in NAD  HEENT: Normocephalic, PERRLA intact,Pink MM CV: s1,s2, RRR, no MRG, No JVD, sternotomy dressing intact, epicardial wires  pulm: clear, diminished, no distress  Abs: bs active, soft  Extremities: no edema, no deformity, moves all extremities on command  Skin: no rash  Neuro: Rass 0, oriented x 4 GU: foley intact   Resolved Problem List:  Post bypass vasoplegia Post op vent management Assessment and Plan:   Multivessel CAD s/p CABG x3 (L internal mammary to LAD; SVG to Ramus; SVG to OM3) -TCTS Dr. Lucas Patient off pressors, coreg  restarted Patient off insulin  gtt, maintain on SSI Continue tele monitoring Keep epicardial wires  Continue ASA and Statin  Mediastinal and left pleural CT per TCTs  Continue pain management per protocol- oxy, tramadol , morphine   Complete post-op abx  Expected post-operative ABLA Expected post-operative consumptive thrombocytopenia  Hgb 14.4, plts 125 P: Continue to trend hgb and plts  Currently no need for transfusions Monitor for signs of bleeding   Paroxsymal Afib s/p prior ablations (most recent 2020 @ [facility]) Hold AC post-op- plans to be restarted prior to DC, per TCTs P: Continue cardiac tele  If proceeds into Afib  RVR, recommend using amio bolus  Continue coreg    Ascending aortic aneurysm -CTA 12/2023 42mm P: Follow up outpatient  Hemochromatosis-stable -01/2023 cardiac MRI no evidence of iron overload -Follows hematology outpatient P: Continue to follow outpatient   DM2 -A1c 6.8 P: Off insulin  gtt  Continue SSI  CBG goal 140-180    Labs:  CBC: Recent Labs  Lab 03/26/24 0342 03/27/24 0453 03/27/24 0801  03/27/24 1112 03/27/24 1124 03/27/24 1331 03/27/24 1459 03/27/24 1546 03/27/24 2130 03/28/24 0440  WBC 6.6 6.7  --   --   --  12.1*  --   --  13.8* 14.3*  HGB 15.7 15.4   < > 12.0*   < > 14.2 14.3 15.0 14.5 14.4  HCT 45.7 44.1   < > 34.7*   < > 41.3 42.0 44.0 41.7 41.7  MCV 99.1 98.0  --   --   --  98.8  --   --  97.7 97.7  PLT 135* 126*  --  100*  --  108*  --   --  118* 125*   < > = values in this interval not displayed.    Basic Metabolic Panel: Recent Labs  Lab 03/25/24 0336 03/26/24 0342 03/27/24 0453 03/27/24 0801 03/27/24 1036 03/27/24 1100 03/27/24 1124 03/27/24 1156 03/27/24 1200 03/27/24 1323 03/27/24 1459 03/27/24 1546 03/27/24 2130 03/28/24 0440  NA 138 136 137   < > 138   < > 137   < > 137 139 140 140 134* 135  K 4.0 4.0 4.3   < > 4.9   < > 5.8*   < > 5.3* 4.2 4.4 4.4 4.1 4.5  CL 104 103 105   < > 103  --  105  --  106  --   --   --  104 105  CO2 23 22 22   --   --   --   --   --   --   --   --   --  21* 21*  GLUCOSE 112* 142* 138*   < > 144*  --  173*  --  190*  --   --   --  171* 131*  BUN 19 24* 20   < > 18  --  17  --  17  --   --   --  16 15  CREATININE 0.70 0.76 0.65   < > 0.60*  --  0.60*  --  0.60*  --   --   --  0.57* 0.59*  CALCIUM  8.6* 8.7* 9.0  --   --   --   --   --   --   --   --   --  8.0* 8.3*  MG  --   --   --   --   --   --   --   --   --   --   --   --  2.5* 2.2   < > = values in this interval not displayed.   GFR: Estimated Creatinine Clearance: 78.3 mL/min (A) (by C-G formula based on SCr of 0.59 mg/dL (L)). Recent Labs  Lab 03/27/24 0453 03/27/24 1331 03/27/24 2130 03/28/24 0440  WBC 6.7 12.1* 13.8* 14.3*    Liver Function Tests: No results for input(s): AST, ALT, ALKPHOS, BILITOT, PROT, ALBUMIN  in the last 168 hours. No results for input(s): LIPASE, AMYLASE in the last 168 hours. No results for input(s): AMMONIA in the last 168 hours.  ABG  Component Value Date/Time   PHART 7.341 (L) 03/27/2024 1546    PCO2ART 39.9 03/27/2024 1546   PO2ART 74 (L) 03/27/2024 1546   HCO3 21.6 03/27/2024 1546   TCO2 23 03/27/2024 1546   ACIDBASEDEF 4.0 (H) 03/27/2024 1546   O2SAT 94 03/27/2024 1546     Coagulation Profile: Recent Labs  Lab 03/27/24 1331  INR 1.3*    Cardiac Enzymes: No results for input(s): CKTOTAL, CKMB, CKMBINDEX, TROPONINI in the last 168 hours.  HbA1C: Hgb A1c MFr Bld  Date/Time Value Ref Range Status  03/24/2024 02:38 AM 6.8 (H) 4.8 - 5.6 % Final    Comment:    (NOTE) Diagnosis of Diabetes The following HbA1c ranges recommended by the American Diabetes Association (ADA) may be used as an aid in the diagnosis of diabetes mellitus.  Hemoglobin             Suggested A1C NGSP%              Diagnosis  <5.7                   Non Diabetic  5.7-6.4                Pre-Diabetic  >6.4                   Diabetic  <7.0                   Glycemic control for                       adults with diabetes.      CBG: Recent Labs  Lab 03/28/24 0210 03/28/24 0437 03/28/24 0648 03/28/24 0806 03/28/24 1125  GLUCAP 133* 128* 152* 140* 151*    Review of Systems:   Unable to assess; patient post-op intubated/sedated  Past Medical History:  He,  has a past medical history of Atrial fibrillation (HCC), Cerebral aneurysm without rupture, Gout, Hemochromatosis, and Hypertension.   Surgical History:   Past Surgical History:  Procedure Laterality Date   ANEURYSM COILING  2006   bone spur removal  1951   below left knee   BRAIN SURGERY     CARDIAC ELECTROPHYSIOLOGY MAPPING AND ABLATION  01/2008; 07/2008   CARDIOVERSION  2000; 2008 03/2008; ;02/15/2011   2008; 2009 2012:    Surgeon: LELON Jacques Blanca Mickey., MD;  Location: [facility] OR;  Service: Cardiovascular;  Laterality: N/A;   CARDIOVERSION  08/05/2011   Procedure: CARDIOVERSION;  Surgeon: Elsie GORMAN Blanca, MD;  Location: [facility] OR;  Service: Cardiovascular;  Laterality: N/A;   CATARACT EXTRACTION W/ INTRAOCULAR LENS   IMPLANT, BILATERAL  2009-2010   CORONARY ARTERY BYPASS GRAFT N/A 03/27/2024   Procedure: CORONARY ARTERY BYPASS GRAFTING TIMES THREE, USING LEFT INTERNAL MAMMARY ARTERY AND ENDOSCOPIC HARVESTED RIGHT GREATER SAPHAENOUS VEIN;  Surgeon: Lucas Dorise POUR, MD;  Location: [facility] OR;  Service: Open Heart Surgery;  Laterality: N/A;   INTRAOPERATIVE TRANSESOPHAGEAL ECHOCARDIOGRAM N/A 03/27/2024   Procedure: ECHOCARDIOGRAM, TRANSESOPHAGEAL, INTRAOPERATIVE;  Surgeon: Lucas Dorise POUR, MD;  Location: [facility] OR;  Service: Open Heart Surgery;  Laterality: N/A;   IR ANGIO INTRA EXTRACRAN SEL COM CAROTID INNOMINATE UNI L MOD SED  11/09/2016   IR ANGIO INTRA EXTRACRAN SEL COM CAROTID INNOMINATE UNI R MOD SED  07/17/2019   IR ANGIO INTRA EXTRACRAN SEL INTERNAL CAROTID UNI R MOD SED  11/09/2016   IR US  GUIDE VASC ACCESS RIGHT  07/17/2019   LEFT HEART  CATH AND CORONARY ANGIOGRAPHY N/A 03/20/2024   Procedure: LEFT HEART CATH AND CORONARY ANGIOGRAPHY;  Surgeon: Elmira Newman PARAS, MD;  Location: [facility] INVASIVE CV LAB;  Service: Cardiovascular;  Laterality: N/A;   LIVER BIOPSY  late 1990's   TOE SURGERY       Social History:   reports that he has quit smoking. His smoking use included cigarettes, pipe, and cigars. He has never used smokeless tobacco. He reports current alcohol use of about 2.0 standard drinks of alcohol per week. He reports that he does not use drugs.   Family History:  His family history is not on file.   Allergies Allergies[1]   Home Medications  Prior to Admission medications  Medication Sig Start Date End Date Taking? Authorizing Provider  acetaminophen  (TYLENOL ) 500 MG tablet Take 1,000 mg by mouth daily as needed for moderate pain (pain score 4-6) or mild pain (pain score 1-3).   Yes [provider]  allopurinol  (ZYLOPRIM ) 300 MG tablet Take 300 mg by mouth daily.     Yes [provider]  amLODipine  (NORVASC ) 10 MG tablet TAKE 1 TABLET DAILY 06/30/23  Yes Waddell Danelle ORN, MD  apixaban   (ELIQUIS ) 5 MG TABS tablet TAKE 1 TABLET TWICE A DAY 06/29/23  Yes Kate Lonni CROME, MD  Ascorbic Acid (VITAMIN C PO) Take 1 tablet by mouth 4 (four) times a week.   Yes [provider]  atorvastatin  (LIPITOR) 10 MG tablet TAKE 1 TABLET BY MOUTH EVERY DAY 01/24/24  Yes Kate Lonni CROME, MD  carvedilol  (COREG ) 6.25 MG tablet Take 1 tablet (6.25 mg total) by mouth 2 (two) times daily. 12/13/23  Yes Kate Lonni CROME, MD  clobetasol cream (TEMOVATE) 0.05 % Apply 1 Application topically daily as needed (irritation). 09/03/21  Yes [provider]  FARXIGA 5 MG TABS tablet Take 5 mg by mouth at bedtime. 08/23/23  Yes [provider]  ketoconazole (NIZORAL) 2 % cream Apply 1 Application topically daily as needed for irritation. 06/16/21  Yes [provider]  Multiple Vitamins-Minerals (ONE A DAY MEN 50 PLUS) TABS Take 1 tablet by mouth 4 (four) times a week.   Yes [provider]  nitroGLYCERIN  (NITROSTAT ) 0.4 MG SL tablet Place 1 tablet (0.4 mg total) under the tongue every 5 (five) minutes as needed for chest pain. 03/09/24  Yes Kate Lonni CROME, MD  Omega-3 Fatty Acids (FISH OIL PO) Take 1 capsule by mouth daily.   Yes [provider]  valsartan (DIOVAN) 320 MG tablet Take 320 mg by mouth daily. 07/27/20  Yes [provider]    Total time: 40 minutes   Christian Maty Zeisler AGACNP-BC   [facility] 03/28/2024, 1:12 PM  Please see [email or web address] for pager details.  From 7A-7P if no response, please call [phone]. After hours, please call ELink [phone].     [1]  Allergies Allergen Reactions   Codeine Itching   Quinolones Other (See Comments)    Ascending thoracic aortic aneurysm    Sulfa Antibiotics Other (See Comments)    Reactions: nausea, shaking of the body   Wheat Rash   "

## 2024-03-28 NOTE — Progress Notes (Signed)
 1 Day Post-Op Procedures (LRB): CORONARY ARTERY BYPASS GRAFTING TIMES THREE, USING LEFT INTERNAL MAMMARY ARTERY AND ENDOSCOPIC HARVESTED RIGHT GREATER SAPHAENOUS VEIN (N/A) ECHOCARDIOGRAM, TRANSESOPHAGEAL, INTRAOPERATIVE (N/A) Subjective: No complaints. Stable night.  Objective: Vital signs in last 24 hours: Temp:  [97.7 F (36.5 C)-99.5 F (37.5 C)] 98.8 F (37.1 C) (12/24 0600) Pulse Rate:  [59-86] 80 (12/24 0600) Cardiac Rhythm: Atrial paced (12/23 2000) Resp:  [13-32] 20 (12/24 0600) BP: (124)/(74) 124/74 (12/23 0715) SpO2:  [91 %-99 %] 93 % (12/24 0600) Arterial Line BP: (78-253)/(53-245) 162/75 (12/24 0600) FiO2 (%):  [40 %-50 %] 40 % (12/23 1442) Weight:  [85 kg] 85 kg (12/24 0500)  Hemodynamic parameters for last 24 hours: PAP: (26-61)/(9-35) 50/27 CO:  [3.3 L/min-6.5 L/min] 4.9 L/min CI:  [1.6 L/min/m2-3.24 L/min/m2] 2.4 L/min/m2  Intake/Output from previous day: 12/23 0701 - 12/24 0700 In: 4527 [I.V.:2015.4; Blood:710; IV Piggyback:1801.6] Out: 3665 [Urine:1805; Blood:1480; Chest Tube:380] Intake/Output this shift: No intake/output data recorded.  General appearance: alert and cooperative Neurologic: intact Heart: regular rate and rhythm Lungs: clear to auscultation bilaterally Extremities: edema mild Wound: dressings dry  Lab Results: Recent Labs    03/27/24 2130 03/28/24 0440  WBC 13.8* 14.3*  HGB 14.5 14.4  HCT 41.7 41.7  PLT 118* 125*   BMET:  Recent Labs    03/27/24 2130 03/28/24 0440  NA 134* 135  K 4.1 4.5  CL 104 105  CO2 21* 21*  GLUCOSE 171* 131*  BUN 16 15  CREATININE 0.57* 0.59*  CALCIUM  8.0* 8.3*    PT/INR:  Recent Labs    03/27/24 1331  LABPROT 16.6*  INR 1.3*   ABG    Component Value Date/Time   PHART 7.341 (L) 03/27/2024 1546   HCO3 21.6 03/27/2024 1546   TCO2 23 03/27/2024 1546   ACIDBASEDEF 4.0 (H) 03/27/2024 1546   O2SAT 94 03/27/2024 1546   CBG (last 3)  Recent Labs    03/28/24 0210 03/28/24 0437  03/28/24 0648  GLUCAP 133* 128* 152*   CXR: mild interstitial edema, mild bibasilar atelectasis  ECG: sinus 75, 1st degree AV block and bifascicular block unchanged from preop.  Assessment/Plan: S/P Procedures (LRB): CORONARY ARTERY BYPASS GRAFTING TIMES THREE, USING LEFT INTERNAL MAMMARY ARTERY AND ENDOSCOPIC HARVESTED RIGHT GREATER SAPHAENOUS VEIN (N/A) ECHOCARDIOGRAM, TRANSESOPHAGEAL, INTRAOPERATIVE (N/A)  POD 1 Hemodynamically stable with tendency to HTN. He was on Coreg , Diovan and Norvasc  preop. Will resume low dose Coreg  for now. Keep pacing wires in for a few days with preop bifascicular block.  Recorded wt below preop which I doubt. Will start diuresis today.  Hx of atrial fib in past and has had 4 ablations and maintaining NSR preop. He was on Eliquis  which I will resume at discharge.  DC swan, arterial line and chest tubes.  IS, OOB.  DM: preop Hgb A1c 6.8 on Farxiga. Will transition to SEMGLEE  and SSI.   LOS: 5 days    Randy Evans 03/28/2024

## 2024-03-29 ENCOUNTER — Inpatient Hospital Stay (HOSPITAL_COMMUNITY)

## 2024-03-29 LAB — CBC
HCT: 38.6 % — ABNORMAL LOW (ref 39.0–52.0)
Hemoglobin: 13.1 g/dL (ref 13.0–17.0)
MCH: 33.8 pg (ref 26.0–34.0)
MCHC: 33.9 g/dL (ref 30.0–36.0)
MCV: 99.5 fL (ref 80.0–100.0)
Platelets: 105 K/uL — ABNORMAL LOW (ref 150–400)
RBC: 3.88 MIL/uL — ABNORMAL LOW (ref 4.22–5.81)
RDW: 13.4 % (ref 11.5–15.5)
WBC: 12.1 K/uL — ABNORMAL HIGH (ref 4.0–10.5)
nRBC: 0 % (ref 0.0–0.2)

## 2024-03-29 LAB — BASIC METABOLIC PANEL WITH GFR
Anion gap: 8 (ref 5–15)
BUN: 19 mg/dL (ref 8–23)
CO2: 26 mmol/L (ref 22–32)
Calcium: 8.6 mg/dL — ABNORMAL LOW (ref 8.9–10.3)
Chloride: 102 mmol/L (ref 98–111)
Creatinine, Ser: 0.72 mg/dL (ref 0.61–1.24)
GFR, Estimated: >60 mL/min
Glucose, Bld: 124 mg/dL — ABNORMAL HIGH (ref 70–99)
Potassium: 4.2 mmol/L (ref 3.5–5.1)
Sodium: 136 mmol/L (ref 135–145)

## 2024-03-29 LAB — GLUCOSE, CAPILLARY
Glucose-Capillary: 121 mg/dL — ABNORMAL HIGH (ref 70–99)
Glucose-Capillary: 143 mg/dL — ABNORMAL HIGH (ref 70–99)
Glucose-Capillary: 163 mg/dL — ABNORMAL HIGH (ref 70–99)
Glucose-Capillary: 187 mg/dL — ABNORMAL HIGH (ref 70–99)
Glucose-Capillary: 234 mg/dL — ABNORMAL HIGH (ref 70–99)

## 2024-03-29 MED ORDER — AMIODARONE LOAD VIA INFUSION
150.0000 mg | Freq: Once | INTRAVENOUS | Status: AC
  Administered 2024-03-29: 150 mg via INTRAVENOUS
  Filled 2024-03-29: qty 83.34

## 2024-03-29 MED ORDER — FUROSEMIDE 10 MG/ML IJ SOLN
40.0000 mg | Freq: Two times a day (BID) | INTRAMUSCULAR | Status: AC
  Administered 2024-03-29 – 2024-03-30 (×2): 40 mg via INTRAVENOUS
  Filled 2024-03-29 (×2): qty 4

## 2024-03-29 MED ORDER — AMIODARONE HCL IN DEXTROSE 360-4.14 MG/200ML-% IV SOLN
60.0000 mg/h | INTRAVENOUS | Status: AC
  Administered 2024-03-29 (×2): 60 mg/h via INTRAVENOUS
  Filled 2024-03-29: qty 400

## 2024-03-29 MED ORDER — AMIODARONE HCL IN DEXTROSE 360-4.14 MG/200ML-% IV SOLN
30.0000 mg/h | INTRAVENOUS | Status: DC
  Administered 2024-03-29 (×2): 30 mg/h via INTRAVENOUS
  Filled 2024-03-29: qty 200

## 2024-03-29 MED ORDER — LACTULOSE 10 GM/15ML PO SOLN
20.0000 g | Freq: Once | ORAL | Status: AC
  Administered 2024-03-29: 20 g via ORAL
  Filled 2024-03-29: qty 30

## 2024-03-29 NOTE — Progress Notes (Signed)
" ° °   °  [address] [employer] [address]             [phone]                2 Days Post-Op Procedures (LRB): CORONARY ARTERY BYPASS GRAFTING TIMES THREE, USING LEFT INTERNAL MAMMARY ARTERY AND ENDOSCOPIC HARVESTED RIGHT GREATER SAPHAENOUS VEIN (N/A) ECHOCARDIOGRAM, TRANSESOPHAGEAL, INTRAOPERATIVE (N/A)   Events: Afib this am _______________________________________________________________ Vitals: BP 120/89   Pulse (!) 113   Temp 98.3 F (36.8 C) (Oral)   Resp (!) 27   Ht 5' 9 (1.753 m)   Wt 88.5 kg   SpO2 95%   BMI 28.81 kg/m  Filed Weights   03/27/24 0634 03/28/24 0500 03/29/24 0300  Weight: 85.7 kg 85 kg 88.5 kg     - Neuro: alert NAD  - Cardiovascular: IRIR  Drips: amio.      - Pulm: EWOB    ABG    Component Value Date/Time   PHART 7.341 (L) 03/27/2024 1546   PCO2ART 39.9 03/27/2024 1546   PO2ART 74 (L) 03/27/2024 1546   HCO3 21.6 03/27/2024 1546   TCO2 23 03/27/2024 1546   ACIDBASEDEF 4.0 (H) 03/27/2024 1546   O2SAT 94 03/27/2024 1546    - Abd: ND - Extremity: trace edema  .Intake/Output      12/24 0701 12/25 0700 12/25 0701 12/26 0700   P.O. 240    I.V. (mL/kg) 85.2 (1)    Blood     IV Piggyback 300    Total Intake(mL/kg) 625.2 (7.1)    Urine (mL/kg/hr) 2450 (1.2) 200 (0.8)   Blood     Chest Tube 40    Total Output 2490 200   Net -1864.8 -200           _______________________________________________________________ Labs:    Latest Ref Rng & Units 03/29/2024    4:34 AM 03/28/2024    4:38 PM 03/28/2024    4:40 AM  CBC  WBC 4.0 - 10.5 K/uL 12.1  17.5  14.3   Hemoglobin 13.0 - 17.0 g/dL 86.8  85.6  85.5   Hematocrit 39.0 - 52.0 % 38.6  42.8  41.7   Platelets 150 - 400 K/uL 105  140  125       Latest Ref Rng & Units 03/29/2024    4:34 AM 03/28/2024    4:38 PM 03/28/2024    4:40 AM  CMP  Glucose 70 - 99 mg/dL 875  802  868   BUN 8 - 23 mg/dL 19  18  15    Creatinine 0.61 - 1.24 mg/dL 9.27  9.19  9.40    Sodium 135 - 145 mmol/L 136  132  135   Potassium 3.5 - 5.1 mmol/L 4.2  4.4  4.5   Chloride 98 - 111 mmol/L 102  99  105   CO2 22 - 32 mmol/L 26  23  21    Calcium  8.9 - 10.3 mg/dL 8.6  8.7  8.3     CXR: stable  _______________________________________________________________  Assessment and Plan: POD 2 s/p CABG  Neuro: pain controlled CV: afib this am.  Started on amio.   Pulm: IS ambulation Renal: creat stable GI: on diet Heme: stable ID: [email or web address] Endo: SSI Dispo: ICU   Randy Evans 03/29/2024 9:44 AM   "

## 2024-03-29 NOTE — Plan of Care (Signed)
" °  Problem: Education: Goal: Understanding of cardiac disease, CV risk reduction, and recovery process will improve Outcome: Progressing   Problem: Activity: Goal: Ability to tolerate increased activity will improve Outcome: Progressing   Problem: Cardiac: Goal: Ability to achieve and maintain adequate cardiovascular perfusion will improve Outcome: Progressing   Problem: Health Behavior/Discharge Planning: Goal: Ability to safely manage health-related needs after discharge will improve Outcome: Progressing   Problem: Education: Goal: Ability to describe self-care measures that may prevent or decrease complications (Diabetes Survival Skills Education) will improve Outcome: Progressing   Problem: Coping: Goal: Ability to adjust to condition or change in health will improve Outcome: Progressing   Problem: Fluid Volume: Goal: Ability to maintain a balanced intake and output will improve Outcome: Progressing   Problem: Health Behavior/Discharge Planning: Goal: Ability to identify and utilize available resources and services will improve Outcome: Progressing Goal: Ability to manage health-related needs will improve Outcome: Progressing   Problem: Metabolic: Goal: Ability to maintain appropriate glucose levels will improve Outcome: Progressing   Problem: Nutritional: Goal: Maintenance of adequate nutrition will improve Outcome: Progressing Goal: Progress toward achieving an optimal weight will improve Outcome: Progressing   Problem: Skin Integrity: Goal: Risk for impaired skin integrity will decrease Outcome: Progressing   Problem: Tissue Perfusion: Goal: Adequacy of tissue perfusion will improve Outcome: Progressing   Problem: Education: Goal: Knowledge of General Education information will improve Description: Including pain rating scale, medication(s)/side effects and non-pharmacologic comfort measures Outcome: Progressing   Problem: Health Behavior/Discharge  Planning: Goal: Ability to manage health-related needs will improve Outcome: Progressing   Problem: Clinical Measurements: Goal: Ability to maintain clinical measurements within normal limits will improve Outcome: Progressing Goal: Will remain free from infection Outcome: Progressing Goal: Diagnostic test results will improve Outcome: Progressing Goal: Respiratory complications will improve Outcome: Progressing Goal: Cardiovascular complication will be avoided Outcome: Progressing   Problem: Activity: Goal: Risk for activity intolerance will decrease Outcome: Progressing   Problem: Nutrition: Goal: Adequate nutrition will be maintained Outcome: Progressing   Problem: Coping: Goal: Level of anxiety will decrease Outcome: Progressing   Problem: Elimination: Goal: Will not experience complications related to bowel motility Outcome: Progressing Goal: Will not experience complications related to urinary retention Outcome: Progressing   Problem: Pain Managment: Goal: General experience of comfort will improve and/or be controlled Outcome: Progressing   Problem: Safety: Goal: Ability to remain free from injury will improve Outcome: Progressing   Problem: Skin Integrity: Goal: Risk for impaired skin integrity will decrease Outcome: Progressing   Problem: Education: Goal: Will demonstrate proper wound care and an understanding of methods to prevent future damage Outcome: Progressing Goal: Knowledge of disease or condition will improve Outcome: Progressing Goal: Knowledge of the prescribed therapeutic regimen will improve Outcome: Progressing Goal: Individualized Educational Video(s) Outcome: Progressing   Problem: Activity: Goal: Risk for activity intolerance will decrease Outcome: Progressing   Problem: Cardiac: Goal: Will achieve and/or maintain hemodynamic stability Outcome: Progressing   Problem: Clinical Measurements: Goal: Postoperative complications will  be avoided or minimized Outcome: Progressing   Problem: Respiratory: Goal: Respiratory status will improve Outcome: Progressing   Problem: Skin Integrity: Goal: Wound healing without signs and symptoms of infection Outcome: Progressing Goal: Risk for impaired skin integrity will decrease Outcome: Progressing   Problem: Urinary Elimination: Goal: Ability to achieve and maintain adequate renal perfusion and functioning will improve Outcome: Progressing   "

## 2024-03-29 NOTE — Progress Notes (Signed)
 "  NAME:  Randy Evans, MRN:  [id number], DOB:  1940-05-01, LOS: 6 ADMISSION DATE:  03/23/2024, CONSULTATION DATE:  03/27/24 REFERRING MD:  Lucas NOVAK, CHIEF COMPLAINT:  s/p CABG x3   History of Present Illness:  Randy Evans is a 83 yo male with past medical history significant for AF s/p multiple ablations, CAD w/ MVD, cerebral aneurysm s/p coil embolization, hemochromatosis, gout, DM2, HTN who initially underwent cardiac cath 12/16 with multivessel CAD, planned for outpatient CABG evaluation, however developed worsening chest pain and presented to the ED 12/22. Patient underwent CABG x3 with Dr. Lucas. PCCM consulted for post-op medical management.  Intra-op: 700 cellsaver CBP time: 90 minutes  Pertinent Medical History:   Past Medical History:  Diagnosis Date   Atrial fibrillation (HCC)    Onset 1991 in [place]    Cerebral aneurysm without rupture    treated with coil embolization 2006   Gout    Hemochromatosis    Has seen Norleen Hint   Hypertension    Significant Hospital Events: Including procedures, antibiotic start and stop dates in addition to other pertinent events   12/22: ED worsening chest pain 12/23: CABG x3 w/ Dr. Lucas 12/24: Patient improving, extubated yesterday afternoon, no events overnight, epicardial wires in place, chest tube removed along with PA catheter   Interim History / Subjective:  Went into A-fib with RVR, started on amiodarone  infusion after amiodarone  bolus Heart rate is controlled but remain in A-fib Stated did not have bowel movement for last 3 days Denies chest pain, nausea, vomiting  Objective    Blood pressure 134/89, pulse (!) 103, temperature 99.3 F (37.4 C), temperature source Oral, resp. rate (!) 28, height 5' 9 (1.753 m), weight 88.5 kg, SpO2 96%.        Intake/Output Summary (Last 24 hours) at 03/29/2024 1204 Last data filed at 03/29/2024 1101 Gross per 24 hour  Intake 560 ml  Output 2400 ml  Net -1840 ml   Filed  Weights   03/27/24 0634 03/28/24 0500 03/29/24 0300  Weight: 85.7 kg 85 kg 88.5 kg   Physical exam: General: Elderly male, lying on the bed HEENT: [place]/AT, eyes anicteric.  moist mucus membranes Neuro: Alert, awake following commands Chest: Central sternotomy incision looks clean and dry, coarse breath sounds, no wheezes or rhonchi.  Mediastinal and chest tube in place Heart: Irregularly irregular, no murmurs or gallops Abdomen: Soft, nontender, nondistended, bowel sounds present  Labs and images reviewed  Resolved Problem List:  Post bypass vasoplegia Post op vent management Assessment and Plan:  Multivessel CAD s/p CABG x3 (L internal mammary to LAD; SVG to Ramus; SVG to OM3) TCTS following Continue aspirin  and atorvastatin  Chest tube output 40 cc in last 24 hours Continue multimodal pain management  Paroxysmal A-fib with prior ablation Patient with history of A-fib on amiodarone  and Coreg  at home He had multiple ablations done before Went into A-fib with RVR this morning, currently on amiodarone  infusion, rate is controlled Continue Coreg   Thrombocytopenia due to CPB Platelet count trended down to 105, closely monitor Watch for signs of bleeding  Ascending aortic aneurysm CTA 12/2023 42mm Follow up outpatient  DM2 A1c 6.8 Continue Lantus  and sliding scale insulin  with CBG goal 140-180  Obstipation Patient last bowel movement was 122 Continue aggressive bowel regimen   Labs:  CBC: Recent Labs  Lab 03/27/24 1331 03/27/24 1459 03/27/24 1546 03/27/24 2130 03/28/24 0440 03/28/24 1638 03/29/24 0434  WBC 12.1*  --   --  13.8* 14.3* 17.5*  12.1*  HGB 14.2   < > 15.0 14.5 14.4 14.3 13.1  HCT 41.3   < > 44.0 41.7 41.7 42.8 38.6*  MCV 98.8  --   --  97.7 97.7 101.7* 99.5  PLT 108*  --   --  118* 125* 140* 105*   < > = values in this interval not displayed.    Basic Metabolic Panel: Recent Labs  Lab 03/27/24 0453 03/27/24 0801 03/27/24 1200 03/27/24 1323  03/27/24 1546 03/27/24 2130 03/28/24 0440 03/28/24 1638 03/29/24 0434  NA 137   < > 137   < > 140 134* 135 132* 136  K 4.3   < > 5.3*   < > 4.4 4.1 4.5 4.4 4.2  CL 105   < > 106  --   --  104 105 99 102  CO2 22  --   --   --   --  21* 21* 23 26  GLUCOSE 138*   < > 190*  --   --  171* 131* 197* 124*  BUN 20   < > 17  --   --  16 15 18 19   CREATININE 0.65   < > 0.60*  --   --  0.57* 0.59* 0.80 0.72  CALCIUM  9.0  --   --   --   --  8.0* 8.3* 8.7* 8.6*  MG  --   --   --   --   --  2.5* 2.2 2.0  --    < > = values in this interval not displayed.   GFR: Estimated Creatinine Clearance: 79.7 mL/min (by C-G formula based on SCr of 0.72 mg/dL). Recent Labs  Lab 03/27/24 2130 03/28/24 0440 03/28/24 1638 03/29/24 0434  WBC 13.8* 14.3* 17.5* 12.1*    Liver Function Tests: No results for input(s): AST, ALT, ALKPHOS, BILITOT, PROT, ALBUMIN  in the last 168 hours. No results for input(s): LIPASE, AMYLASE in the last 168 hours. No results for input(s): AMMONIA in the last 168 hours.  ABG    Component Value Date/Time   PHART 7.341 (L) 03/27/2024 1546   PCO2ART 39.9 03/27/2024 1546   PO2ART 74 (L) 03/27/2024 1546   HCO3 21.6 03/27/2024 1546   TCO2 23 03/27/2024 1546   ACIDBASEDEF 4.0 (H) 03/27/2024 1546   O2SAT 94 03/27/2024 1546     Coagulation Profile: Recent Labs  Lab 03/27/24 1331  INR 1.3*    Cardiac Enzymes: No results for input(s): CKTOTAL, CKMB, CKMBINDEX, TROPONINI in the last 168 hours.  HbA1C: Hgb A1c MFr Bld  Date/Time Value Ref Range Status  03/24/2024 02:38 AM 6.8 (H) 4.8 - 5.6 % Final    Comment:    (NOTE) Diagnosis of Diabetes The following HbA1c ranges recommended by the American Diabetes Association (ADA) may be used as an aid in the diagnosis of diabetes mellitus.  Hemoglobin             Suggested A1C NGSP%              Diagnosis  <5.7                   Non Diabetic  5.7-6.4                Pre-Diabetic  >6.4                    Diabetic  <7.0  Glycemic control for                       adults with diabetes.      CBG: Recent Labs  Lab 03/28/24 1952 03/28/24 2331 03/29/24 0434 03/29/24 0625 03/29/24 1145  GLUCAP 203* 154* 121* 143* 163*     Valinda Novas, MD [facility] See Amion for pager If no response to pager, please call [phone] until 7pm After 7pm, Please call E-link [phone]   "

## 2024-03-29 NOTE — Progress Notes (Signed)
 EVENING ROUNDS NOTE :     [address] [employer] [address]             [phone]               2 Days Post-Op Procedures (LRB): CORONARY ARTERY BYPASS GRAFTING TIMES THREE, USING LEFT INTERNAL MAMMARY ARTERY AND ENDOSCOPIC HARVESTED RIGHT GREATER SAPHAENOUS VEIN (N/A) ECHOCARDIOGRAM, TRANSESOPHAGEAL, INTRAOPERATIVE (N/A)   Total Length of Stay:  LOS: 6 days  Events:   No events  Remains in afib    BP 122/79   Pulse 85   Temp 97.9 F (36.6 C) (Oral)   Resp 15   Ht 5' 9 (1.753 m)   Wt 88.5 kg   SpO2 94%   BMI 28.81 kg/m          amiodarone  30 mg/hr (03/29/24 1932)    I/O last 3 completed shifts: In: 1677.3 [P.O.:960; I.V.:417.3; IV Piggyback:300] Out: 3415 [Urine:3375; Chest Tube:40]      Latest Ref Rng & Units 03/29/2024    4:34 AM 03/28/2024    4:38 PM 03/28/2024    4:40 AM  CBC  WBC 4.0 - 10.5 K/uL 12.1  17.5  14.3   Hemoglobin 13.0 - 17.0 g/dL 86.8  85.6  85.5   Hematocrit 39.0 - 52.0 % 38.6  42.8  41.7   Platelets 150 - 400 K/uL 105  140  125        Latest Ref Rng & Units 03/29/2024    4:34 AM 03/28/2024    4:38 PM 03/28/2024    4:40 AM  BMP  Glucose 70 - 99 mg/dL 875  802  868   BUN 8 - 23 mg/dL 19  18  15    Creatinine 0.61 - 1.24 mg/dL 9.27  9.19  9.40   Sodium 135 - 145 mmol/L 136  132  135   Potassium 3.5 - 5.1 mmol/L 4.2  4.4  4.5   Chloride 98 - 111 mmol/L 102  99  105   CO2 22 - 32 mmol/L 26  23  21    Calcium  8.9 - 10.3 mg/dL 8.6  8.7  8.3     ABG    Component Value Date/Time   PHART 7.341 (L) 03/27/2024 1546   PCO2ART 39.9 03/27/2024 1546   PO2ART 74 (L) 03/27/2024 1546   HCO3 21.6 03/27/2024 1546   TCO2 23 03/27/2024 1546   ACIDBASEDEF 4.0 (H) 03/27/2024 1546   O2SAT 94 03/27/2024 1546       Linnie Rayas, MD 03/29/2024 7:47 PM

## 2024-03-29 NOTE — Plan of Care (Signed)
" °  Problem: Education: Goal: Understanding of cardiac disease, CV risk reduction, and recovery process will improve Outcome: Progressing Goal: Individualized Educational Video(s) Outcome: Progressing   Problem: Activity: Goal: Ability to tolerate increased activity will improve Outcome: Progressing   Problem: Cardiac: Goal: Ability to achieve and maintain adequate cardiovascular perfusion will improve Outcome: Progressing   Problem: Health Behavior/Discharge Planning: Goal: Ability to safely manage health-related needs after discharge will improve Outcome: Progressing   Problem: Education: Goal: Ability to describe self-care measures that may prevent or decrease complications (Diabetes Survival Skills Education) will improve Outcome: Progressing Goal: Individualized Educational Video(s) Outcome: Progressing   Problem: Coping: Goal: Ability to adjust to condition or change in health will improve Outcome: Progressing   Problem: Fluid Volume: Goal: Ability to maintain a balanced intake and output will improve Outcome: Progressing   Problem: Health Behavior/Discharge Planning: Goal: Ability to identify and utilize available resources and services will improve Outcome: Progressing Goal: Ability to manage health-related needs will improve Outcome: Progressing   Problem: Metabolic: Goal: Ability to maintain appropriate glucose levels will improve Outcome: Progressing   Problem: Nutritional: Goal: Maintenance of adequate nutrition will improve Outcome: Progressing Goal: Progress toward achieving an optimal weight will improve Outcome: Progressing   Problem: Skin Integrity: Goal: Risk for impaired skin integrity will decrease Outcome: Progressing   Problem: Tissue Perfusion: Goal: Adequacy of tissue perfusion will improve Outcome: Progressing   Problem: Education: Goal: Knowledge of General Education information will improve Description: Including pain rating scale,  medication(s)/side effects and non-pharmacologic comfort measures Outcome: Progressing   Problem: Health Behavior/Discharge Planning: Goal: Ability to manage health-related needs will improve Outcome: Progressing   Problem: Clinical Measurements: Goal: Ability to maintain clinical measurements within normal limits will improve Outcome: Progressing Goal: Will remain free from infection Outcome: Progressing Goal: Diagnostic test results will improve Outcome: Progressing Goal: Respiratory complications will improve Outcome: Progressing Goal: Cardiovascular complication will be avoided Outcome: Progressing   Problem: Activity: Goal: Risk for activity intolerance will decrease Outcome: Progressing   Problem: Nutrition: Goal: Adequate nutrition will be maintained Outcome: Progressing   Problem: Coping: Goal: Level of anxiety will decrease Outcome: Progressing   Problem: Elimination: Goal: Will not experience complications related to bowel motility Outcome: Progressing Goal: Will not experience complications related to urinary retention Outcome: Progressing   Problem: Pain Managment: Goal: General experience of comfort will improve and/or be controlled Outcome: Progressing   Problem: Safety: Goal: Ability to remain free from injury will improve Outcome: Progressing   Problem: Skin Integrity: Goal: Risk for impaired skin integrity will decrease Outcome: Progressing   Problem: Education: Goal: Will demonstrate proper wound care and an understanding of methods to prevent future damage Outcome: Progressing Goal: Knowledge of disease or condition will improve Outcome: Progressing Goal: Knowledge of the prescribed therapeutic regimen will improve Outcome: Progressing Goal: Individualized Educational Video(s) Outcome: Progressing   Problem: Activity: Goal: Risk for activity intolerance will decrease Outcome: Progressing   Problem: Cardiac: Goal: Will achieve and/or  maintain hemodynamic stability Outcome: Progressing   Problem: Clinical Measurements: Goal: Postoperative complications will be avoided or minimized Outcome: Progressing   Problem: Respiratory: Goal: Respiratory status will improve Outcome: Progressing   Problem: Skin Integrity: Goal: Wound healing without signs and symptoms of infection Outcome: Progressing Goal: Risk for impaired skin integrity will decrease Outcome: Progressing   Problem: Urinary Elimination: Goal: Ability to achieve and maintain adequate renal perfusion and functioning will improve Outcome: Progressing   "

## 2024-03-30 DIAGNOSIS — I2510 Atherosclerotic heart disease of native coronary artery without angina pectoris: Secondary | ICD-10-CM | POA: Diagnosis not present

## 2024-03-30 DIAGNOSIS — Z951 Presence of aortocoronary bypass graft: Secondary | ICD-10-CM | POA: Diagnosis not present

## 2024-03-30 DIAGNOSIS — K5900 Constipation, unspecified: Secondary | ICD-10-CM

## 2024-03-30 DIAGNOSIS — Z7902 Long term (current) use of antithrombotics/antiplatelets: Secondary | ICD-10-CM

## 2024-03-30 DIAGNOSIS — Z794 Long term (current) use of insulin: Secondary | ICD-10-CM | POA: Diagnosis not present

## 2024-03-30 DIAGNOSIS — E1165 Type 2 diabetes mellitus with hyperglycemia: Secondary | ICD-10-CM | POA: Diagnosis not present

## 2024-03-30 DIAGNOSIS — Z7982 Long term (current) use of aspirin: Secondary | ICD-10-CM | POA: Diagnosis not present

## 2024-03-30 DIAGNOSIS — D696 Thrombocytopenia, unspecified: Secondary | ICD-10-CM | POA: Diagnosis not present

## 2024-03-30 DIAGNOSIS — I480 Paroxysmal atrial fibrillation: Secondary | ICD-10-CM | POA: Diagnosis not present

## 2024-03-30 DIAGNOSIS — R739 Hyperglycemia, unspecified: Secondary | ICD-10-CM

## 2024-03-30 DIAGNOSIS — I7121 Aneurysm of the ascending aorta, without rupture: Secondary | ICD-10-CM | POA: Diagnosis not present

## 2024-03-30 DIAGNOSIS — I4891 Unspecified atrial fibrillation: Secondary | ICD-10-CM

## 2024-03-30 LAB — GLUCOSE, CAPILLARY
Glucose-Capillary: 101 mg/dL — ABNORMAL HIGH (ref 70–99)
Glucose-Capillary: 105 mg/dL — ABNORMAL HIGH (ref 70–99)
Glucose-Capillary: 122 mg/dL — ABNORMAL HIGH (ref 70–99)
Glucose-Capillary: 131 mg/dL — ABNORMAL HIGH (ref 70–99)
Glucose-Capillary: 209 mg/dL — ABNORMAL HIGH (ref 70–99)
Glucose-Capillary: 254 mg/dL — ABNORMAL HIGH (ref 70–99)

## 2024-03-30 MED ORDER — INSULIN ASPART 100 UNIT/ML IJ SOLN
2.0000 [IU] | Freq: Three times a day (TID) | INTRAMUSCULAR | Status: DC
  Administered 2024-03-30: 2 [IU] via SUBCUTANEOUS

## 2024-03-30 MED ORDER — INSULIN ASPART 100 UNIT/ML IJ SOLN: 0.0000 [IU] | Freq: Every day | INTRAMUSCULAR | Status: DC

## 2024-03-30 MED ORDER — APIXABAN 5 MG PO TABS
5.0000 mg | ORAL_TABLET | Freq: Two times a day (BID) | ORAL | Status: DC
  Administered 2024-03-31 – 2024-04-01 (×3): 5 mg via ORAL
  Filled 2024-03-30 (×3): qty 2

## 2024-03-30 MED ORDER — INSULIN ASPART 100 UNIT/ML IJ SOLN
4.0000 [IU] | Freq: Three times a day (TID) | INTRAMUSCULAR | Status: DC
  Administered 2024-03-30 – 2024-04-01 (×5): 4 [IU] via SUBCUTANEOUS
  Filled 2024-03-30: qty 4
  Filled 2024-03-30 (×2): qty 1
  Filled 2024-03-30: qty 4

## 2024-03-30 MED ORDER — FENTANYL CITRATE (PF) 50 MCG/ML IJ SOSY: 25.0000 ug | PREFILLED_SYRINGE | Freq: Once | INTRAMUSCULAR | Status: DC

## 2024-03-30 MED ORDER — ~~LOC~~ CARDIAC SURGERY, PATIENT & FAMILY EDUCATION
Freq: Once | Status: AC
  Administered 2024-03-30: 1

## 2024-03-30 MED ORDER — SODIUM CHLORIDE 0.9% FLUSH: 3.0000 mL | INTRAVENOUS | Status: DC | PRN

## 2024-03-30 MED ORDER — SODIUM CHLORIDE 0.9 % IV SOLN: 250.0000 mL | INTRAVENOUS | Status: AC | PRN

## 2024-03-30 MED ORDER — ORAL CARE MOUTH RINSE: 15.0000 mL | OROMUCOSAL | Status: DC | PRN

## 2024-03-30 MED ORDER — INSULIN ASPART 100 UNIT/ML IJ SOLN: 0.0000 [IU] | Freq: Three times a day (TID) | INTRAMUSCULAR | Status: DC

## 2024-03-30 MED ORDER — FUROSEMIDE 40 MG PO TABS
40.0000 mg | ORAL_TABLET | Freq: Every day | ORAL | Status: DC
  Administered 2024-03-31 – 2024-04-01 (×2): 40 mg via ORAL
  Filled 2024-03-30 (×2): qty 1

## 2024-03-30 MED ORDER — ASPIRIN 81 MG PO TBEC
81.0000 mg | DELAYED_RELEASE_TABLET | Freq: Every day | ORAL | Status: DC
  Administered 2024-03-31 – 2024-04-01 (×2): 81 mg via ORAL
  Filled 2024-03-30 (×2): qty 1

## 2024-03-30 MED ORDER — INSULIN ASPART 100 UNIT/ML IJ SOLN
0.0000 [IU] | Freq: Three times a day (TID) | INTRAMUSCULAR | Status: DC
  Administered 2024-03-30: 8 [IU] via SUBCUTANEOUS
  Administered 2024-03-30 – 2024-04-01 (×3): 2 [IU] via SUBCUTANEOUS
  Filled 2024-03-30: qty 8
  Filled 2024-03-30: qty 1
  Filled 2024-03-30: qty 2

## 2024-03-30 MED ORDER — POTASSIUM CHLORIDE CRYS ER 20 MEQ PO TBCR
20.0000 meq | EXTENDED_RELEASE_TABLET | Freq: Two times a day (BID) | ORAL | Status: DC
  Administered 2024-03-30 – 2024-04-01 (×5): 20 meq via ORAL
  Filled 2024-03-30 (×5): qty 1

## 2024-03-30 MED ORDER — SODIUM CHLORIDE 0.9% FLUSH
3.0000 mL | Freq: Two times a day (BID) | INTRAVENOUS | Status: DC
  Administered 2024-03-30 – 2024-04-01 (×4): 3 mL via INTRAVENOUS

## 2024-03-30 MED ORDER — POLYETHYLENE GLYCOL 3350 17 G PO PACK: 17.0000 g | PACK | Freq: Every day | ORAL | Status: DC

## 2024-03-30 MED ORDER — AMIODARONE HCL 200 MG PO TABS
400.0000 mg | ORAL_TABLET | Freq: Two times a day (BID) | ORAL | Status: DC
  Administered 2024-03-30 – 2024-04-01 (×5): 400 mg via ORAL
  Filled 2024-03-30 (×5): qty 2

## 2024-03-30 NOTE — Progress Notes (Signed)
 PHARMACY - ANTICOAGULATION CONSULT NOTE  Pharmacy Consult for apixaban  Indication: atrial fibrillation  Allergies[1]  Patient Measurements: Height: 5' 9 (175.3 cm) Weight: 87.6 kg (193 lb 2 oz) IBW/kg (Calculated) : 70.7 HEPARIN  DW (KG): 85.7  Vital Signs: Temp: 98.9 F (37.2 C) (12/26 1218) Temp Source: Oral (12/26 1218) BP: 121/77 (12/26 1500) Pulse Rate: 67 (12/26 1500)  Labs: Recent Labs    03/28/24 0440 03/28/24 1638 03/29/24 0434  HGB 14.4 14.3 13.1  HCT 41.7 42.8 38.6*  PLT 125* 140* 105*  CREATININE 0.59* 0.80 0.72    Estimated Creatinine Clearance: 79.4 mL/min (by C-G formula based on SCr of 0.72 mg/dL).   Medical History: Past Medical History:  Diagnosis Date   Atrial fibrillation (HCC)    Onset 1991 in [place]    Cerebral aneurysm without rupture    treated with coil embolization 2006   Gout    Hemochromatosis    Has seen Norleen Hint   Hypertension     Medications:  Medications Prior to Admission  Medication Sig Dispense Refill Last Dose/Taking   acetaminophen  (TYLENOL ) 500 MG tablet Take 1,000 mg by mouth daily as needed for moderate pain (pain score 4-6) or mild pain (pain score 1-3).   03/23/2024   allopurinol  (ZYLOPRIM ) 300 MG tablet Take 300 mg by mouth daily.     03/23/2024   amLODipine  (NORVASC ) 10 MG tablet TAKE 1 TABLET DAILY 90 tablet 3 03/23/2024   apixaban  (ELIQUIS ) 5 MG TABS tablet TAKE 1 TABLET TWICE A DAY 180 tablet 2 03/23/2024 at  6:30 AM   Ascorbic Acid (VITAMIN C PO) Take 1 tablet by mouth 4 (four) times a week.   03/22/2024   atorvastatin  (LIPITOR) 10 MG tablet TAKE 1 TABLET BY MOUTH EVERY DAY 90 tablet 2 03/23/2024   carvedilol  (COREG ) 6.25 MG tablet Take 1 tablet (6.25 mg total) by mouth 2 (two) times daily. 180 tablet 3 03/23/2024   clobetasol cream (TEMOVATE) 0.05 % Apply 1 Application topically daily as needed (irritation).   Unknown   FARXIGA 5 MG TABS tablet Take 5 mg by mouth at bedtime.   03/22/2024   ketoconazole  (NIZORAL) 2 % cream Apply 1 Application topically daily as needed for irritation.   Unknown   Multiple Vitamins-Minerals (ONE A DAY MEN 50 PLUS) TABS Take 1 tablet by mouth 4 (four) times a week.   03/22/2024   nitroGLYCERIN  (NITROSTAT ) 0.4 MG SL tablet Place 1 tablet (0.4 mg total) under the tongue every 5 (five) minutes as needed for chest pain. 25 tablet 1 03/23/2024   Omega-3 Fatty Acids (FISH OIL PO) Take 1 capsule by mouth daily.   03/22/2024   valsartan (DIOVAN) 320 MG tablet Take 320 mg by mouth daily.   03/23/2024    Assessment: 83 y.o. M s/p CABG on 12/23. Pt on Eliquis  pta for afib (last dose 12/19 0630). Restarting 12/27. Has been on Lovenox  40mg  at bedtime. Hgb 13.1, plt 105.  Goal of Therapy:  Prevention of CVA Monitor platelets by anticoagulation protocol: Yes   Plan:  Eliquis  5mg  po BID - starting 12/27 D/c Lovenox  after dose this evening Will f/u s/s bleeding and CBC  Vito Ralph, PharmD, BCPS Please see amion for complete clinical pharmacist phone list 03/30/2024,3:37 PM      [1]  Allergies Allergen Reactions   Codeine Itching   Quinolones Other (See Comments)    Ascending thoracic aortic aneurysm    Sulfa Antibiotics Other (See Comments)    Reactions: nausea, shaking of the  body   Wheat Rash

## 2024-03-30 NOTE — Progress Notes (Signed)
 Post OHS education including sternal precautions, site care, IS use, HH diet, exercise guidelines and CRPII reviewed. All questions answered. Pt will have friends and family staying with him when he is discharged. Pt is getting ready to walk with RN. CRPII referral placed for [place] location.  [phone]  Augustin Sharps, RRT, BSRT 03/30/24 2:22pm

## 2024-03-30 NOTE — Progress Notes (Signed)
 3 Days Post-Op Procedures (LRB): CORONARY ARTERY BYPASS GRAFTING TIMES THREE, USING LEFT INTERNAL MAMMARY ARTERY AND ENDOSCOPIC HARVESTED RIGHT GREATER SAPHAENOUS VEIN (N/A) ECHOCARDIOGRAM, TRANSESOPHAGEAL, INTRAOPERATIVE (N/A) Subjective: No complaints. Bowels working. Ambulated   Rhythm looks sinus 70's but may still be having some AF.  Objective: Vital signs in last 24 hours: Temp:  [97.9 F (36.6 C)-99.3 F (37.4 C)] 98.5 F (36.9 C) (12/26 0400) Pulse Rate:  [63-117] 80 (12/26 0600) Cardiac Rhythm: Atrial fibrillation (12/26 0400) Resp:  [12-31] 19 (12/26 0600) BP: (100-153)/(61-105) 135/83 (12/26 0600) SpO2:  [92 %-97 %] 94 % (12/26 0600) Weight:  [87.6 kg] 87.6 kg (12/26 0500)  Hemodynamic parameters for last 24 hours:    Intake/Output from previous day: 12/25 0701 - 12/26 0700 In: 1278.1 [P.O.:720; I.V.:558.1] Out: 2855 [Urine:2855] Intake/Output this shift: No intake/output data recorded.  General appearance: alert and cooperative Neurologic: intact Heart: regular rate and rhythm Lungs: clear to auscultation bilaterally Extremities: edema mild Wound: chest incision healing well  Lab Results: Recent Labs    03/28/24 1638 03/29/24 0434  WBC 17.5* 12.1*  HGB 14.3 13.1  HCT 42.8 38.6*  PLT 140* 105*   BMET:  Recent Labs    03/28/24 1638 03/29/24 0434  NA 132* 136  K 4.4 4.2  CL 99 102  CO2 23 26  GLUCOSE 197* 124*  BUN 18 19  CREATININE 0.80 0.72  CALCIUM  8.7* 8.6*    PT/INR:  Recent Labs    03/27/24 1331  LABPROT 16.6*  INR 1.3*   ABG    Component Value Date/Time   PHART 7.341 (L) 03/27/2024 1546   HCO3 21.6 03/27/2024 1546   TCO2 23 03/27/2024 1546   ACIDBASEDEF 4.0 (H) 03/27/2024 1546   O2SAT 94 03/27/2024 1546   CBG (last 3)  Recent Labs    03/29/24 2030 03/29/24 2359 03/30/24 0345  GLUCAP 234* 105* 101*    Assessment/Plan: S/P Procedures (LRB): CORONARY ARTERY BYPASS GRAFTING TIMES THREE, USING LEFT INTERNAL MAMMARY  ARTERY AND ENDOSCOPIC HARVESTED RIGHT GREATER SAPHAENOUS VEIN (N/A) ECHOCARDIOGRAM, TRANSESOPHAGEAL, INTRAOPERATIVE (N/A)  POD 3 Hemodynamically stable. Postop AF with hx of AF in past with 4 prior ablations. Had been on Tikosyn  in the past but it was stopped since he was maintaining NSR. He was continued on Eliquis . Started on amio IV yesterday for AF and looks sinus on monitor this am. Will check ECG. Convert to po amio.   Remove pacing wires this am and if stable for two hours will remove sleeve and foley.  DM: glucose under reasonable control on Lantus  and SSI.  -1577 cc yesterday. Wt down 2 lbs but probably still above preop wt. Will continue daily Lasix  for a few more days.  Plan to transfer to 4E later after pacing wires removed if stable.  Plan to resume Eliquis  and ASA 81 mg tomorrow.   LOS: 7 days    Dorise MARLA Fellers 03/30/2024

## 2024-03-30 NOTE — Progress Notes (Signed)
 "  NAME:  Randy Evans, MRN:  [id number], DOB:  1941-02-03, LOS: 7 ADMISSION DATE:  03/23/2024, CONSULTATION DATE:  03/27/24 REFERRING MD:  Randy Evans, CHIEF COMPLAINT:  s/p CABG x3   History of Present Illness:  Randy Evans is a 83 yo male with past medical history significant for AF s/p multiple ablations, CAD w/ MVD, cerebral aneurysm s/p coil embolization, hemochromatosis, gout, DM2, HTN who initially underwent cardiac cath 12/16 with multivessel CAD, planned for outpatient CABG evaluation, however developed worsening chest pain and presented to the ED 12/22. Patient underwent CABG x3 with Dr. Lucas. PCCM consulted for post-op medical management.  Intra-op: 700 cellsaver CBP time: 90 minutes  Pertinent Medical History:   Past Medical History:  Diagnosis Date   Atrial fibrillation (HCC)    Onset 1991 in [place]    Cerebral aneurysm without rupture    treated with coil embolization 2006   Gout    Hemochromatosis    Has seen Norleen Hint   Hypertension    Significant Hospital Events: Including procedures, antibiotic start and stop dates in addition to other pertinent events   12/22: ED worsening chest pain 12/23: CABG x3 w/ Dr. Lucas 12/24: Patient improving, extubated yesterday afternoon, no events overnight, epicardial wires in place, chest tube removed along with PA catheter  12/25: Pacer wires out today; transfer  Interim History / Subjective:  Remains rate controlled on amio gtt; will transition to PO this morning. Overall improving; ambulating, eating, toileting appropriately. BG elevated post-prandial, will add meal-time coverage. Pacer wires and foley out today. If stable can transfer to 4E today. Objective    Blood pressure 126/68, pulse 70, temperature 98.9 F (37.2 C), temperature source Oral, resp. rate 20, height 5' 9 (1.753 m), weight 87.6 kg, SpO2 95%.        Intake/Output Summary (Last 24 hours) at 03/30/2024 1252 Last data filed at 03/30/2024 1200 Gross  per 24 hour  Intake 1537.21 ml  Output 3155 ml  Net -1617.79 ml   Filed Weights   03/28/24 0500 03/29/24 0300 03/30/24 0500  Weight: 85 kg 88.5 kg 87.6 kg   Physical exam: General: acute on chronically-ill elderly male, in NAD HEENT: AT/[place], PERRL, 3mm bilaterally Pulm: normal inspiratory and expiratory effort on RA, CTAB CV: irreg/irreg, sternotomy incision approx; c/d/i GI: soft, non distended, non tender to palpation Neuro: A&O x3, no focal deficits   Resolved Problem List:  Post bypass vasoplegia Post op vent management Assessment and Plan:  Multivessel CAD s/p CABG x3 (L internal mammary to LAD; SVG to Ramus; SVG to OM3) TCTS following Continue aspirin  and atorvastatin  Continue multimodal pain management  Paroxysmal A-fib with prior ablations Transitioned off amio gtt to PO Continue home Coreg   Thrombocytopenia due to CPB Platelet count trended down to 105, closely monitor Watch for signs of bleeding  Ascending aortic aneurysm CTA 12/2023 42mm Follow up outpatient  DM2 A1c 6.8 Continue Lantus  and sliding scale insulin  with CBG goal 140-180  Obstipation Last BM 12/25 Continue aggressive bowel regimen   Labs:  CBC: Recent Labs  Lab 03/27/24 1331 03/27/24 1459 03/27/24 1546 03/27/24 2130 03/28/24 0440 03/28/24 1638 03/29/24 0434  WBC 12.1*  --   --  13.8* 14.3* 17.5* 12.1*  HGB 14.2   < > 15.0 14.5 14.4 14.3 13.1  HCT 41.3   < > 44.0 41.7 41.7 42.8 38.6*  MCV 98.8  --   --  97.7 97.7 101.7* 99.5  PLT 108*  --   --  118*  125* 140* 105*   < > = values in this interval not displayed.    Basic Metabolic Panel: Recent Labs  Lab 03/27/24 0453 03/27/24 0801 03/27/24 1200 03/27/24 1323 03/27/24 1546 03/27/24 2130 03/28/24 0440 03/28/24 1638 03/29/24 0434  NA 137   < > 137   < > 140 134* 135 132* 136  K 4.3   < > 5.3*   < > 4.4 4.1 4.5 4.4 4.2  CL 105   < > 106  --   --  104 105 99 102  CO2 22  --   --   --   --  21* 21* 23 26  GLUCOSE 138*    < > 190*  --   --  171* 131* 197* 124*  BUN 20   < > 17  --   --  16 15 18 19   CREATININE 0.65   < > 0.60*  --   --  0.57* 0.59* 0.80 0.72  CALCIUM  9.0  --   --   --   --  8.0* 8.3* 8.7* 8.6*  MG  --   --   --   --   --  2.5* 2.2 2.0  --    < > = values in this interval not displayed.   GFR: Estimated Creatinine Clearance: 79.4 mL/min (by C-G formula based on SCr of 0.72 mg/dL). Recent Labs  Lab 03/27/24 2130 03/28/24 0440 03/28/24 1638 03/29/24 0434  WBC 13.8* 14.3* 17.5* 12.1*    Liver Function Tests: No results for input(s): AST, ALT, ALKPHOS, BILITOT, PROT, ALBUMIN  in the last 168 hours. No results for input(s): LIPASE, AMYLASE in the last 168 hours. No results for input(s): AMMONIA in the last 168 hours.  ABG    Component Value Date/Time   PHART 7.341 (L) 03/27/2024 1546   PCO2ART 39.9 03/27/2024 1546   PO2ART 74 (L) 03/27/2024 1546   HCO3 21.6 03/27/2024 1546   TCO2 23 03/27/2024 1546   ACIDBASEDEF 4.0 (H) 03/27/2024 1546   O2SAT 94 03/27/2024 1546     Coagulation Profile: Recent Labs  Lab 03/27/24 1331  INR 1.3*    Cardiac Enzymes: No results for input(s): CKTOTAL, CKMB, CKMBINDEX, TROPONINI in the last 168 hours.  HbA1C: Hgb A1c MFr Bld  Date/Time Value Ref Range Status  03/24/2024 02:38 AM 6.8 (H) 4.8 - 5.6 % Final    Comment:    (NOTE) Diagnosis of Diabetes The following HbA1c ranges recommended by the American Diabetes Association (ADA) may be used as an aid in the diagnosis of diabetes mellitus.  Hemoglobin             Suggested A1C NGSP%              Diagnosis  <5.7                   Non Diabetic  5.7-6.4                Pre-Diabetic  >6.4                   Diabetic  <7.0                   Glycemic control for                       adults with diabetes.      CBG: Recent Labs  Lab 03/29/24 2030 03/29/24 2359 03/30/24 0345 03/30/24 9147 03/30/24  1215  GLUCAP 234* 105* 101* 254* 209*     Warren Shade, DNP, AGACNP-BC [facility]  Please see [email or web address] for pager details.  From 7A-7P if no response, please call [phone]. After hours, please call ELink [phone].   "

## 2024-03-30 NOTE — Plan of Care (Signed)
   Problem: Activity: Goal: Ability to tolerate increased activity will improve Outcome: Progressing

## 2024-03-31 DIAGNOSIS — Z951 Presence of aortocoronary bypass graft: Secondary | ICD-10-CM

## 2024-03-31 LAB — GLUCOSE, CAPILLARY
Glucose-Capillary: 112 mg/dL — ABNORMAL HIGH (ref 70–99)
Glucose-Capillary: 170 mg/dL — ABNORMAL HIGH (ref 70–99)
Glucose-Capillary: 152 mg/dL — ABNORMAL HIGH (ref 70–99)
Glucose-Capillary: 195 mg/dL — ABNORMAL HIGH (ref 70–99)
Glucose-Capillary: 110 mg/dL — ABNORMAL HIGH (ref 70–99)
Glucose-Capillary: 187 mg/dL — ABNORMAL HIGH (ref 70–99)

## 2024-03-31 LAB — BASIC METABOLIC PANEL WITH GFR
Anion gap: 10 (ref 5–15)
BUN: 15 mg/dL (ref 8–23)
CO2: 27 mmol/L (ref 22–32)
Calcium: 8.5 mg/dL — ABNORMAL LOW (ref 8.9–10.3)
Chloride: 102 mmol/L (ref 98–111)
Creatinine, Ser: 0.68 mg/dL (ref 0.61–1.24)
GFR, Estimated: >60 mL/min
Glucose, Bld: 108 mg/dL — ABNORMAL HIGH (ref 70–99)
Potassium: 3.9 mmol/L (ref 3.5–5.1)
Sodium: 139 mmol/L (ref 135–145)

## 2024-03-31 NOTE — Progress Notes (Signed)
 Patient is alert and oriented, denies any pain/ discomfort, has good oral intake, walking independently, he walked approx 1200 feet in the hallway, tolerated well, incision sites are C/D/I.

## 2024-03-31 NOTE — Plan of Care (Signed)
" °  Problem: Cardiac: Goal: Ability to achieve and maintain adequate cardiovascular perfusion will improve Outcome: Progressing   Problem: Activity: Goal: Ability to tolerate increased activity will improve Outcome: Progressing   Problem: Fluid Volume: Goal: Ability to maintain a balanced intake and output will improve Outcome: Progressing   Problem: Metabolic: Goal: Ability to maintain appropriate glucose levels will improve Outcome: Progressing   Problem: Nutritional: Goal: Maintenance of adequate nutrition will improve Outcome: Progressing Goal: Progress toward achieving an optimal weight will improve Outcome: Progressing   Problem: Tissue Perfusion: Goal: Adequacy of tissue perfusion will improve Outcome: Progressing   Problem: Education: Goal: Knowledge of General Education information will improve Description: Including pain rating scale, medication(s)/side effects and non-pharmacologic comfort measures Outcome: Progressing   "

## 2024-03-31 NOTE — Progress Notes (Signed)
 CARDIAC REHAB PHASE I   PRE:  Rate/Rhythm: 61  BP:  Supine: 131/76     SaO2: 96% RA  MODE:  Ambulation: 750 ft   POST:  Rate/Rhythem: 71  BP:  Supine: 131/76     SaO2: 96  [id number] Patient ambulated independently in the hallway tolerated well. Assisted back to recliner with call bell within reach.using IS  Hadassah Elpidio Quan RN

## 2024-03-31 NOTE — Progress Notes (Addendum)
 "     [address] [employer] [address]             [phone]     4 Days Post-Op Procedures (LRB): CORONARY ARTERY BYPASS GRAFTING TIMES THREE, USING LEFT INTERNAL MAMMARY ARTERY AND ENDOSCOPIC HARVESTED RIGHT GREATER SAPHAENOUS VEIN (N/A) ECHOCARDIOGRAM, TRANSESOPHAGEAL, INTRAOPERATIVE (N/A) Subjective: Feeling well   Objective: Vital signs in last 24 hours: Temp:  [98 F (36.7 C)-98.9 F (37.2 C)] 98 F (36.7 C) (12/27 0751) Pulse Rate:  [62-79] 72 (12/27 0751) Cardiac Rhythm: Normal sinus rhythm;Bundle branch block;Heart block (12/26 2113) Resp:  [17-24] 20 (12/27 0751) BP: (112-158)/(67-93) 144/80 (12/27 0751) SpO2:  [84 %-100 %] 99 % (12/27 0751) Weight:  [87.1 kg] 87.1 kg (12/27 0514)  Hemodynamic parameters for last 24 hours:    Intake/Output from previous day: 12/26 0701 - 12/27 0700 In: 926.4 [P.O.:720; I.V.:31.4] Out: 700 [Urine:700] Intake/Output this shift: No intake/output data recorded.  General appearance: alert, cooperative, and no distress Heart: regular rate and rhythm Lungs: clear to auscultation bilaterally Abdomen: benign Extremities: trace edema Wound: incis healing well  Lab Results: Recent Labs    03/28/24 1638 03/29/24 0434  WBC 17.5* 12.1*  HGB 14.3 13.1  HCT 42.8 38.6*  PLT 140* 105*   BMET:  Recent Labs    03/29/24 0434 03/31/24 0501  NA 136 139  K 4.2 3.9  CL 102 102  CO2 26 27  GLUCOSE 124* 108*  BUN 19 15  CREATININE 0.72 0.68  CALCIUM  8.6* 8.5*    PT/INR: No results for input(s): LABPROT, INR in the last 72 hours. ABG    Component Value Date/Time   PHART 7.341 (L) 03/27/2024 1546   HCO3 21.6 03/27/2024 1546   TCO2 23 03/27/2024 1546   ACIDBASEDEF 4.0 (H) 03/27/2024 1546   O2SAT 94 03/27/2024 1546   CBG (last 3)  Recent Labs    03/30/24 1608 03/30/24 2248 03/31/24 0608  GLUCAP 131* 122* 112*    Meds Scheduled Meds:  acetaminophen   1,000 mg Oral Q6H   Or   acetaminophen   (TYLENOL ) oral liquid 160 mg/5 mL  1,000 mg Per Tube Q6H   allopurinol   300 mg Oral Daily   amiodarone   400 mg Oral BID   apixaban   5 mg Oral BID   aspirin  EC  81 mg Oral Daily   atorvastatin   10 mg Oral Daily   carvedilol   6.25 mg Oral BID WC   fentaNYL  (SUBLIMAZE ) injection  25 mcg Intravenous Once   furosemide   40 mg Oral Daily   insulin  aspart  0-24 Units Subcutaneous TID WC   insulin  aspart  4 Units Subcutaneous TID WC   insulin  glargine  20 Units Subcutaneous Daily   pantoprazole   40 mg Oral Daily   potassium chloride   20 mEq Oral BID   sodium chloride  flush  3 mL Intravenous Q12H   Continuous Infusions:  sodium chloride      PRN Meds:.sodium chloride , diphenhydrAMINE , ondansetron  (ZOFRAN ) IV, mouth rinse, oxyCODONE , sodium chloride  flush, traMADol   Xrays No results found.  Assessment/Plan: S/P Procedures (LRB): CORONARY ARTERY BYPASS GRAFTING TIMES THREE, USING LEFT INTERNAL MAMMARY ARTERY AND ENDOSCOPIC HARVESTED RIGHT GREATER SAPHAENOUS VEIN (N/A) ECHOCARDIOGRAM, TRANSESOPHAGEAL, INTRAOPERATIVE (N/A) POD#4 CABG   1 afeb, sinus w/BBB, s BP 110's-150's, on amio for post-op afib, back on apixaban /bASA 2 O2 sats good on RA 3 voiding- amts not measured, weight approx 2 kg>preop, cont to diurese, normal renal fxn 4 BS  adeq control mostly- one reading>200, A1C 6.8- resume farxiga at d/c- will need good long term outpatient management 5 potential d/c in am, cont pulm hygiene and rehab modalities    LOS: 8 days    Lemond FORBES Cera PA-C Pager [phone] 03/31/2024   Chart reviewed, patient examined, agree with above.  He feels well and is ambulating well. Rhythm remains sinus on po amio and Coreg . Plan home tomorrow if no changes. "

## 2024-04-01 DIAGNOSIS — Z951 Presence of aortocoronary bypass graft: Secondary | ICD-10-CM

## 2024-04-01 LAB — GLUCOSE, CAPILLARY: Glucose-Capillary: 125 mg/dL — ABNORMAL HIGH (ref 70–99)

## 2024-04-01 MED ORDER — OXYCODONE HCL 5 MG PO TABS: 5.0000 mg | ORAL_TABLET | Freq: Four times a day (QID) | ORAL | 0 refills | Status: AC | PRN

## 2024-04-01 MED ORDER — ASPIRIN 81 MG PO TBEC: 81.0000 mg | DELAYED_RELEASE_TABLET | Freq: Every day | ORAL | Status: AC

## 2024-04-01 MED ORDER — FUROSEMIDE 40 MG PO TABS: 40.0000 mg | ORAL_TABLET | Freq: Every day | ORAL | 0 refills | Status: DC

## 2024-04-01 MED ORDER — AMIODARONE HCL 400 MG PO TABS: 400.0000 mg | ORAL_TABLET | Freq: Two times a day (BID) | ORAL | 1 refills | Status: AC

## 2024-04-01 MED ORDER — POTASSIUM CHLORIDE CRYS ER 20 MEQ PO TBCR: 20.0000 meq | EXTENDED_RELEASE_TABLET | Freq: Every day | ORAL | 0 refills | Status: DC

## 2024-04-01 NOTE — Progress Notes (Signed)
 Reviewed AVS, patient expressed understanding of medications, MD follow up reviewed.    Removed IV, Site clean, dry and intact.   See LDA for information on wounds at discharge.  CCMD contacted and informed patients is being discharged.   Patient states all belongings brought to the hospital at time of admission are accounted for and packed to take home.   Patient informed and expressed understanding where to pick up discharge medications.   [facility] Transport contacted to transport patient to Discharge lounge to wait for transportation home.

## 2024-04-01 NOTE — Progress Notes (Signed)
 Patient is alert and oriented, denies any pain or discomfort, ambulated 1000 feet on the unit independently.

## 2024-04-01 NOTE — Progress Notes (Addendum)
 5 Days Post-Op Procedures (LRB): CORONARY ARTERY BYPASS GRAFTING TIMES THREE, USING LEFT INTERNAL MAMMARY ARTERY AND ENDOSCOPIC HARVESTED RIGHT GREATER SAPHAENOUS VEIN (N/A) ECHOCARDIOGRAM, TRANSESOPHAGEAL, INTRAOPERATIVE (N/A) Subjective: Looks and feels well  Objective: Vital signs in last 24 hours: Temp:  [98.1 F (36.7 C)-98.7 F (37.1 C)] 98.7 F (37.1 C) (12/28 0313) Pulse Rate:  [30-69] 30 (12/28 0313) Cardiac Rhythm: Normal sinus rhythm;Heart block;Bundle branch block (12/27 2034) Resp:  [17-20] 17 (12/28 0313) BP: (99-150)/(42-89) 118/64 (12/28 0313) SpO2:  [97 %-98 %] 98 % (12/27 1933) Weight:  [86.8 kg] 86.8 kg (12/28 0559)  Hemodynamic parameters for last 24 hours:    Intake/Output from previous day: 12/27 0701 - 12/28 0700 In: 723 [P.O.:720; I.V.:3] Out: -  Intake/Output this shift: No intake/output data recorded.  General appearance: alert, cooperative, and no distress Heart: regular rate and rhythm Lungs: clear to auscultation bilaterally Abdomen: benign Extremities: minor lower ext edema Wound: incis healing well  Lab Results: No results for input(s): WBC, HGB, HCT, PLT in the last 72 hours. BMET:  Recent Labs    03/31/24 0501  NA 139  K 3.9  CL 102  CO2 27  GLUCOSE 108*  BUN 15  CREATININE 0.68  CALCIUM  8.5*    PT/INR: No results for input(s): LABPROT, INR in the last 72 hours. ABG    Component Value Date/Time   PHART 7.341 (L) 03/27/2024 1546   HCO3 21.6 03/27/2024 1546   TCO2 23 03/27/2024 1546   ACIDBASEDEF 4.0 (H) 03/27/2024 1546   O2SAT 94 03/27/2024 1546   CBG (last 3)  Recent Labs    03/31/24 1759 03/31/24 2103 04/01/24 0557  GLUCAP 110* 187* 125*    Meds Scheduled Meds:  acetaminophen   1,000 mg Oral Q6H   Or   acetaminophen  (TYLENOL ) oral liquid 160 mg/5 mL  1,000 mg Per Tube Q6H   allopurinol   300 mg Oral Daily   amiodarone   400 mg Oral BID   apixaban   5 mg Oral BID   aspirin  EC  81 mg Oral Daily    atorvastatin   10 mg Oral Daily   carvedilol   6.25 mg Oral BID WC   furosemide   40 mg Oral Daily   insulin  aspart  0-24 Units Subcutaneous TID WC   insulin  aspart  4 Units Subcutaneous TID WC   insulin  glargine  20 Units Subcutaneous Daily   pantoprazole   40 mg Oral Daily   potassium chloride   20 mEq Oral BID   sodium chloride  flush  3 mL Intravenous Q12H   Continuous Infusions: PRN Meds:.diphenhydrAMINE , ondansetron  (ZOFRAN ) IV, mouth rinse, oxyCODONE , sodium chloride  flush, traMADol   Xrays No results found.  Assessment/Plan: S/P Procedures (LRB): CORONARY ARTERY BYPASS GRAFTING TIMES THREE, USING LEFT INTERNAL MAMMARY ARTERY AND ENDOSCOPIC HARVESTED RIGHT GREATER SAPHAENOUS VEIN (N/A) ECHOCARDIOGRAM, TRANSESOPHAGEAL, INTRAOPERATIVE (N/A) POD#5 CABG  1 afeb, s BP 90's-150's- mostly in well controlled range, SR,some missed beats, PAC's,  did have post op afib- on amio /apix/b ASA 2 O2 sats good on RA 3 weight only about 1 kg>preop, with some lower ext edema will cont to diurese as outpatient 4 BS well controlled- resume farxiga at d/c 5 no new labs or xrays 6 stable for D/C- reviewed instructions    LOS: 9 days    Lemond FORBES Viviane DEVONNA Oager [phone] 04/01/2024   Chart reviewed, patient examined, agree with above.  He feels well and ready to go home.

## 2024-04-02 MED FILL — Electrolyte-R (PH 7.4) Solution: INTRAVENOUS | Qty: 4000 | Status: AC

## 2024-04-02 MED FILL — Mannitol IV Soln 20%: INTRAVENOUS | Qty: 500 | Status: AC

## 2024-04-02 MED FILL — Sodium Chloride IV Soln 0.9%: INTRAVENOUS | Qty: 2000 | Status: AC

## 2024-04-02 MED FILL — Sodium Bicarbonate IV Soln 8.4%: INTRAVENOUS | Qty: 50 | Status: AC

## 2024-04-02 MED FILL — Lidocaine HCl Local Soln Prefilled Syringe 100 MG/5ML (2%): INTRAMUSCULAR | Qty: 5 | Status: AC

## 2024-04-03 ENCOUNTER — Telehealth (HOSPITAL_COMMUNITY): Payer: Self-pay

## 2024-04-03 NOTE — Telephone Encounter (Signed)
 Attempted to call patient in regards to Cardiac Rehab - LM on VM

## 2024-04-03 NOTE — Telephone Encounter (Signed)
 Referral recv'd and verified for MD signature. Follow up appointment is on 1/5. Insurance benefits and eligibility TBD.

## 2024-04-04 ENCOUNTER — Other Ambulatory Visit: Payer: Self-pay | Admitting: Cardiology

## 2024-04-04 DIAGNOSIS — I480 Paroxysmal atrial fibrillation: Secondary | ICD-10-CM

## 2024-04-04 NOTE — Telephone Encounter (Signed)
 Eliquis  5mg  refill request received. Patient is 83 years old, weight-86.8kg, Crea-0.68 on 03/31/24, Diagnosis-afib, and last seen by Dr. Kate on 03/09/24. Dose is appropriate based on dosing criteria. Will send in refill to requested pharmacy.

## 2024-04-08 NOTE — Progress Notes (Unsigned)
 " Cardiology Office Note:    Date:  04/09/2024   ID:  Randy Evans, DOB Jan 20, 1941, MRN [id number]  PCP:  Regino Slater, MD  Cardiologist:  Lonni LITTIE Nanas, MD  Electrophysiologist:  Danelle Birmingham, MD   Referring MD: Regino Slater, MD   Chief Complaint  Patient presents with   Coronary Artery Disease    History of Present Illness:    Randy Evans is a 84 y.o. male with a hx of CAD s/p CABG, atrial fibrillation/flutter, ascending aortic aneurysm, cerebral aneurysm status post coil embolization, hemochromatosis, gout who presents for follow-up.    Cardiac MRI 01/2023 showed LVEF 56%, no evidence of myocardial iron overload, LGE consistent with prior infarct in basal anterolateral and basal to mid inferolateral wall.  Stress PET 03/06/24 showed LAD ischemia, TID, reduced myocardial blood flow reserve, high risk study.  Zio patch x 2 weeks 02/2024 showed 1 episode of NSVT lasting 5 beats, 39 episodes of SVT longest lasting 14 seconds.  LHC 03/20/2024 showed 60% left main stenosis, 90% proximal LAD, 70% proximal to mid LAD, 20% proximal ramus, 40% proximal RCA.  Echocardiogram 03/20/2024 showed EF 55 to 60%, G3 DD, low normal RV function, RVSP 57, moderate left atrial enlargement, no significant valvular disease, dilated ascending aorta measuring 44 mm.  Underwent CABG x 3 on 03/27/24 (LIMA-LAD, SVG-ramus, SVG-OM 3)  Since discharge from the hospital, he reports he is doing well.  Denies any chest pain, dyspnea, lightheadedness, syncope, lower extremity edema, or palpitations.  He reports he has been walking, denies any exertional symptoms.    BP Readings from Last 3 Encounters:  04/09/24 (!) 152/82  04/01/24 127/74  03/20/24 (!) 162/96     Wt Readings from Last 3 Encounters:  04/09/24 190 lb (86.2 kg)  04/01/24 191 lb 6.4 oz (86.8 kg)  03/20/24 190 lb (86.2 kg)     Past Medical History:  Diagnosis Date   Atrial fibrillation (HCC)    Onset 1991 in [place]    Cerebral  aneurysm without rupture    treated with coil embolization 2006   Gout    Hemochromatosis    Has seen Norleen Hint   Hypertension     Past Surgical History:  Procedure Laterality Date   ANEURYSM COILING  2006   bone spur removal  1951   below left knee   BRAIN SURGERY     CARDIAC ELECTROPHYSIOLOGY MAPPING AND ABLATION  01/2008; 07/2008   CARDIOVERSION  2000; 2008 03/2008; ;02/15/2011   2008; 2009 2012:    Surgeon: LELON Jacques Blanca Mickey., MD;  Location: [facility] OR;  Service: Cardiovascular;  Laterality: N/A;   CARDIOVERSION  08/05/2011   Procedure: CARDIOVERSION;  Surgeon: Elsie GORMAN Blanca, MD;  Location: [facility] OR;  Service: Cardiovascular;  Laterality: N/A;   CATARACT EXTRACTION W/ INTRAOCULAR LENS  IMPLANT, BILATERAL  2009-2010   CORONARY ARTERY BYPASS GRAFT N/A 03/27/2024   Procedure: CORONARY ARTERY BYPASS GRAFTING TIMES THREE, USING LEFT INTERNAL MAMMARY ARTERY AND ENDOSCOPIC HARVESTED RIGHT GREATER SAPHAENOUS VEIN;  Surgeon: Lucas Dorise POUR, MD;  Location: [facility] OR;  Service: Open Heart Surgery;  Laterality: N/A;   INTRAOPERATIVE TRANSESOPHAGEAL ECHOCARDIOGRAM N/A 03/27/2024   Procedure: ECHOCARDIOGRAM, TRANSESOPHAGEAL, INTRAOPERATIVE;  Surgeon: Lucas Dorise POUR, MD;  Location: [facility] OR;  Service: Open Heart Surgery;  Laterality: N/A;   IR ANGIO INTRA EXTRACRAN SEL COM CAROTID INNOMINATE UNI L MOD SED  11/09/2016   IR ANGIO INTRA EXTRACRAN SEL COM CAROTID INNOMINATE UNI R MOD SED  07/17/2019   IR  ANGIO INTRA EXTRACRAN SEL INTERNAL CAROTID UNI R MOD SED  11/09/2016   IR US  GUIDE VASC ACCESS RIGHT  07/17/2019   LEFT HEART CATH AND CORONARY ANGIOGRAPHY N/A 03/20/2024   Procedure: LEFT HEART CATH AND CORONARY ANGIOGRAPHY;  Surgeon: Elmira Newman PARAS, MD;  Location: [facility] INVASIVE CV LAB;  Service: Cardiovascular;  Laterality: N/A;   LIVER BIOPSY  late 1990's   TOE SURGERY      Current Medications: Current Meds  Medication Sig   acetaminophen  (TYLENOL ) 500 MG tablet Take 1,000 mg by mouth daily as needed  for moderate pain (pain score 4-6) or mild pain (pain score 1-3).   allopurinol  (ZYLOPRIM ) 300 MG tablet Take 300 mg by mouth daily.     amiodarone  (PACERONE ) 400 MG tablet Take 1 tablet (400 mg total) by mouth 2 (two) times daily. For 5 days then take 400 mg once daily   apixaban  (ELIQUIS ) 5 MG TABS tablet TAKE 1 TABLET TWICE A DAY   Ascorbic Acid (VITAMIN C PO) Take 1 tablet by mouth 4 (four) times a week.   aspirin  EC 81 MG tablet Take 1 tablet (81 mg total) by mouth daily. Swallow whole.   atorvastatin  (LIPITOR) 10 MG tablet TAKE 1 TABLET BY MOUTH EVERY DAY   carvedilol  (COREG ) 6.25 MG tablet Take 1 tablet (6.25 mg total) by mouth 2 (two) times daily.   clobetasol cream (TEMOVATE) 0.05 % Apply 1 Application topically daily as needed (irritation).   FARXIGA 5 MG TABS tablet Take 5 mg by mouth at bedtime.   ketoconazole (NIZORAL) 2 % cream Apply 1 Application topically daily as needed for irritation.   Multiple Vitamins-Minerals (ONE A DAY MEN 50 PLUS) TABS Take 1 tablet by mouth 4 (four) times a week.   Omega-3 Fatty Acids (FISH OIL PO) Take 1 capsule by mouth daily.   oxyCODONE  (OXY IR/ROXICODONE ) 5 MG immediate release tablet Take 1 tablet (5 mg total) by mouth every 6 (six) hours as needed for up to 7 days for severe pain (pain score 7-10).     Allergies:   Codeine, Quinolones, Sulfa antibiotics, and Wheat   Social History   Socioeconomic History   Marital status: Married    Spouse name: Not on file   Number of children: Not on file   Years of education: Not on file   Highest education level: Not on file  Occupational History   Not on file  Tobacco Use   Smoking status: Former    Types: Cigarettes, Pipe, Cigars   Smokeless tobacco: Never   Tobacco comments:    quit smoking 1966  Substance and Sexual Activity   Alcohol use: Yes    Alcohol/week: 2.0 standard drinks of alcohol    Types: 2 Glasses of wine per week    Comment: SOCIAL   Drug use: No   Sexual activity: Yes     Partners: Female    Comment: MARRIED  Other Topics Concern   Not on file  Social History Narrative   Retired from [employer].  Moved from [place] . Graduate of [employer].  Married.   Social Drivers of Health   Tobacco Use: Medium Risk (03/27/2024)   Patient History    Smoking Tobacco Use: Former    Smokeless Tobacco Use: Never    Passive Exposure: Not on file  Financial Resource Strain: Not on file  Food Insecurity: No Food Insecurity (03/24/2024)   Epic    Worried About Radiation Protection Practitioner of Food in the Last Year: Never true  Ran Out of Food in the Last Year: Never true  Transportation Needs: No Transportation Needs (03/24/2024)   Epic    Lack of Transportation (Medical): No    Lack of Transportation (Non-Medical): No  Physical Activity: Not on file  Stress: Not on file  Social Connections: Socially Integrated (03/24/2024)   Social Connection and Isolation Panel    Frequency of Communication with Friends and Family: More than three times a week    Frequency of Social Gatherings with Friends and Family: More than three times a week    Attends Religious Services: More than 4 times per year    Active Member of Clubs or Organizations: Yes    Attends Banker Meetings: More than 4 times per year    Marital Status: Married  Depression (PHQ2-9): Low Risk (02/01/2023)   Depression (PHQ2-9)    PHQ-2 Score: 0  Alcohol Screen: Not on file  Housing: Low Risk (03/24/2024)   Epic    Unable to Pay for Housing in the Last Year: No    Number of Times Moved in the Last Year: 0    Homeless in the Last Year: No  Utilities: Not At Risk (03/24/2024)   Epic    Threatened with loss of utilities: No  Health Literacy: Not on file     Family History: The patient's family history is not on file.  ROS:   Please see the history of present illness.     All other systems reviewed and are negative.  EKGs/Labs/Other Studies Reviewed:    The following studies were reviewed today:   EKG:    01/05/2023: Sinus rhythm with first-degree AV block, rate 60, right bundle branch block, left anterior fascicular block 12/13/2023: Sinus bradycardia with first-degree AV block, PVC, right bundle branch block, left anterior fascicular block, rate 59 03/09/2024: Sinus rhythm with first-degree AV block, rate 62, right bundle branch block, first-degree AV block, left finger fascicular block  Recent Labs: 02/28/2024: ALT 45 03/09/2024: TSH 1.380 03/28/2024: Magnesium  2.0 03/29/2024: Hemoglobin 13.1; Platelets 105 03/31/2024: BUN 15; Creatinine, Ser 0.68; Potassium 3.9; Sodium 139  Recent Lipid Panel    Component Value Date/Time   CHOL 86 03/24/2024 0238   TRIG 89 03/24/2024 0238   HDL 33 (L) 03/24/2024 0238   CHOLHDL 2.6 03/24/2024 0238   VLDL 18 03/24/2024 0238   LDLCALC 35 03/24/2024 0238    Physical Exam:    VS:  BP (!) 152/82 (BP Location: Left Arm, Patient Position: Sitting, Cuff Size: Normal)   Pulse (!) 59   Ht 5' 9 (1.753 m)   Wt 190 lb (86.2 kg)   SpO2 97%   BMI 28.06 kg/m     Wt Readings from Last 3 Encounters:  04/09/24 190 lb (86.2 kg)  04/01/24 191 lb 6.4 oz (86.8 kg)  03/20/24 190 lb (86.2 kg)     GEN:  Well nourished, well developed in no acute distress HEENT: Normal NECK: No JVD; No carotid bruits CARDIAC: RRR, no murmurs, rubs, gallops RESPIRATORY:  Clear to auscultation without rales, wheezing or rhonchi  ABDOMEN: Soft, non-tender, non-distended MUSCULOSKELETAL: Trace edema SKIN: Warm and dry NEUROLOGIC:  Alert and oriented x 3 PSYCHIATRIC:  Normal affect   ASSESSMENT:    1. Coronary artery disease of native artery of native heart with stable angina pectoris   2. Paroxysmal atrial fibrillation (HCC)   3. Essential hypertension   4. Hyperlipidemia, unspecified hyperlipidemia type      PLAN:    CAD: Cardiac MRI  08/2019 showed subendocardial LGE consistent with prior infarct in basal to mid inferolateral wall and basal anterolateral wall.  Overall EF  66%.  Reported chest pain and underwent stress PET 03/06/24 which showed LAD ischemia, TID, reduced myocardial blood flow reserve, high risk study.  LHC 03/20/2024 showed 60% left main stenosis, 90% proximal LAD, 70% proximal to mid LAD, 20% proximal ramus, 40% proximal RCA.  Echocardiogram 03/20/2024 showed EF 55 to 60%, G3 DD, low normal RV function, RVSP 57, moderate left atrial enlargement, no significant valvular disease, dilated ascending aorta measuring 44 mm.  Underwent CABG x 3 on 03/27/24 (LIMA-LAD, SVG-ramus, SVG-OM 3) -Continue Eliquis  5 mg twice daily and aspirin  81 mg daily -Continue atorvastatin  10 mg daily -Continue carvedilol  6.25 mg twice daily  Paroxysmal atrial fibrillation: Follows with EP.  Status post multiple ablations.  Previously has been on Tikosyn  but off antiarrhythmic therapy.  Echocardiogram 04/2018 showed EF 50 to 55%, grade 2 diastolic dysfunction, ascending aortic dilatation measuring 43 mm.  Appears to be maintaining sinus rhythm since his last ablation in 06/2018.  Zio patch x 2 weeks 02/2024 showed 1 episode of NSVT lasting 5 beats, 39 episodes of SVT longest lasting 14 seconds. -Continue Eliquis  5 mg twice daily -Continue carvedilol  -Currently on postoperative amiodarone  as he recovers from his CABG  Aortic aneurysm: Measured 43 mm in ascending aorta on echo 04/2019.  CTA chest 10/2021 showed ascending aortic aneurysm measuring 46 mm.  MRI chest 01/2023 showed ascending aorta measured 42 mm, sinus of Valsalva measured 43 mm.  He follows with Dr. Lucas, CTA chest 12/2023 showed ascending aortic dilatation measuring 42 mm  Hypertension: On carvedilol  6.25 mg twice daily.  Previously on amlodipine  and valsartan, were discontinued following CABG.  BP mildly elevated in clinic today but reports has been well-controlled when he checks at home, asked to check daily for next week and let us  know results  Hyperlipidemia: On atorvastatin  10 mg daily.  LDL 60 on  06/22/2023  Hemochromatosis: previously required weekly phlebotomy now only receiving rarely.  Reports diagnosis was confirmed with genetic testing in [place].   -Referred to hematology -Cardiac MRI 01/2023 showed no evidence of myocardial iron overload  Lower extremity edema: Suspect due to amlodipine  use.  Normal BNP, albumin  01/2023.  Has improved.  Discharged on Lasix  40 mg daily after CABG, reports no longer on amiodarone  and appears euvolemic on exam  RTC in 2 months     Medication Adjustments/Labs and Tests Ordered: Current medicines are reviewed at length with the patient today.  Concerns regarding medicines are outlined above.  No orders of the defined types were placed in this encounter.  No orders of the defined types were placed in this encounter.   Patient Instructions  Medication Instructions:  Your physician recommends that you continue on your current medications as directed. Please refer to the Current Medication list given to you today.  *If you need a refill on your cardiac medications before your next appointment, please call your pharmacy*  Lab Work: None ordered If you have labs (blood work) drawn today and your tests are completely normal, you will receive your results only by: MyChart Message (if you have MyChart) OR A paper copy in the mail If you have any lab test that is abnormal or we need to change your treatment, we will call you to review the results.  Testing/Procedures: None ordered  Follow-Up: At [facility], you and your health needs are our priority.  As part of our  continuing mission to provide you with exceptional heart care, our providers are all part of one team.  This team includes your primary Cardiologist (physician) and Advanced Practice Providers or APPs (Physician Assistants and Nurse Practitioners) who all work together to provide you with the care you need, when you need it.  Your next appointment:   2-3  month(s)  Provider:   Lonni LITTIE Nanas, MD    We recommend signing up for the patient portal called MyChart.  Sign up information is provided on this After Visit Summary.  MyChart is used to connect with patients for Virtual Visits (Telemedicine).  Patients are able to view lab/test results, encounter notes, upcoming appointments, etc.  Non-urgent messages can be sent to your provider as well.   To learn more about what you can do with MyChart, go to [email or web address].   Other Instructions Your physician has requested that you regularly monitor and record your blood pressure readings at home once daily for one week. Please use the same machine at the same time of day to check your readings and record them, send them in via MyChart Please monitor blood pressures and keep a log of your readings.    Make sure to check 2 hours after your medications.    AVOID these things for 30 minutes before checking your blood pressure: No Drinking caffeine. No Drinking alcohol. No Eating. No Smoking. No Exercising.   Five minutes before checking your blood pressure: Pee. Sit in a dining chair. Avoid sitting in a soft couch or armchair. Be quiet. Do not talk            Signed, Lonni LITTIE Nanas, MD  04/09/2024 5:58 PM    [facility] "

## 2024-04-09 ENCOUNTER — Ambulatory Visit: Payer: Self-pay | Attending: Surgery | Admitting: *Deleted

## 2024-04-09 ENCOUNTER — Ambulatory Visit: Attending: Cardiology | Admitting: Cardiology

## 2024-04-09 VITALS — BP 152/82 | HR 59 | Ht 69.0 in | Wt 190.0 lb

## 2024-04-09 DIAGNOSIS — I10 Essential (primary) hypertension: Secondary | ICD-10-CM | POA: Insufficient documentation

## 2024-04-09 DIAGNOSIS — Z4802 Encounter for removal of sutures: Secondary | ICD-10-CM

## 2024-04-09 DIAGNOSIS — I25118 Atherosclerotic heart disease of native coronary artery with other forms of angina pectoris: Secondary | ICD-10-CM | POA: Diagnosis not present

## 2024-04-09 DIAGNOSIS — E785 Hyperlipidemia, unspecified: Secondary | ICD-10-CM | POA: Insufficient documentation

## 2024-04-09 DIAGNOSIS — I480 Paroxysmal atrial fibrillation: Secondary | ICD-10-CM | POA: Insufficient documentation

## 2024-04-09 NOTE — Progress Notes (Signed)
 Patient arrived for nurse visit to remove sutures post-CABG 12/23 by Dr. Lucas.  Three sutures removed with no signs or symptoms of infection noted.  Patient tolerated suture removal well.  Patient instructed to keep the incision site clean and dry. Redness noted at bottom portion of surgical incision. Patient unable to tell how long redness has been present. Denies warmth, pain or drainage from aware. Advised patient to watch redness and notify our office if worsens, spreads or incision begins to drain. Patient acknowledged instructions given.  All questions answered.

## 2024-04-09 NOTE — Patient Instructions (Addendum)
 Medication Instructions:  Your physician recommends that you continue on your current medications as directed. Please refer to the Current Medication list given to you today.  *If you need a refill on your cardiac medications before your next appointment, please call your pharmacy*  Lab Work: None ordered If you have labs (blood work) drawn today and your tests are completely normal, you will receive your results only by: MyChart Message (if you have MyChart) OR A paper copy in the mail If you have any lab test that is abnormal or we need to change your treatment, we will call you to review the results.  Testing/Procedures: None ordered  Follow-Up: At [facility], you and your health needs are our priority.  As part of our continuing mission to provide you with exceptional heart care, our providers are all part of one team.  This team includes your primary Cardiologist (physician) and Advanced Practice Providers or APPs (Physician Assistants and Nurse Practitioners) who all work together to provide you with the care you need, when you need it.  Your next appointment:   2-3 month(s)  Provider:   Lonni LITTIE Nanas, MD    We recommend signing up for the patient portal called MyChart.  Sign up information is provided on this After Visit Summary.  MyChart is used to connect with patients for Virtual Visits (Telemedicine).  Patients are able to view lab/test results, encounter notes, upcoming appointments, etc.  Non-urgent messages can be sent to your provider as well.   To learn more about what you can do with MyChart, go to [email or web address].   Other Instructions Your physician has requested that you regularly monitor and record your blood pressure readings at home once daily for one week. Please use the same machine at the same time of day to check your readings and record them, send them in via MyChart Please monitor blood pressures and keep a log of your readings.     Make sure to check 2 hours after your medications.    AVOID these things for 30 minutes before checking your blood pressure: No Drinking caffeine. No Drinking alcohol. No Eating. No Smoking. No Exercising.   Five minutes before checking your blood pressure: Pee. Sit in a dining chair. Avoid sitting in a soft couch or armchair. Be quiet. Do not talk

## 2024-04-17 ENCOUNTER — Other Ambulatory Visit: Payer: Self-pay | Admitting: Thoracic Surgery (Cardiothoracic Vascular Surgery)

## 2024-04-17 ENCOUNTER — Other Ambulatory Visit: Payer: Self-pay | Admitting: Surgery

## 2024-04-17 ENCOUNTER — Encounter: Payer: Self-pay | Admitting: Cardiology

## 2024-04-17 DIAGNOSIS — I77819 Aortic ectasia, unspecified site: Secondary | ICD-10-CM

## 2024-04-18 ENCOUNTER — Ambulatory Visit: Payer: Self-pay

## 2024-04-18 ENCOUNTER — Ambulatory Visit (HOSPITAL_COMMUNITY)
Admission: RE | Admit: 2024-04-18 | Discharge: 2024-04-18 | Disposition: A | Discharge status: Home / Self Care | Source: Ambulatory Visit | Attending: Cardiology | Admitting: Cardiology

## 2024-04-18 VITALS — BP 132/78 | HR 58 | Resp 18 | Ht 69.0 in | Wt 194.0 lb

## 2024-04-18 DIAGNOSIS — I77819 Aortic ectasia, unspecified site: Secondary | ICD-10-CM | POA: Diagnosis present

## 2024-04-18 DIAGNOSIS — Z951 Presence of aortocoronary bypass graft: Secondary | ICD-10-CM

## 2024-04-18 MED ORDER — POTASSIUM CHLORIDE CRYS ER 20 MEQ PO TBCR: 20.0000 meq | EXTENDED_RELEASE_TABLET | Freq: Every day | ORAL | 0 refills | Status: DC

## 2024-04-18 MED ORDER — FUROSEMIDE 40 MG PO TABS: 40.0000 mg | ORAL_TABLET | Freq: Every day | ORAL | 0 refills | Status: DC

## 2024-04-18 NOTE — Progress Notes (Signed)
 "     [address] [place] [address]             [phone]       HPI:  Patient returns for routine postoperative follow-up having undergone  Coronary artery bypass grafting x 3, Left internal mammary artery graft to the LAD, SVG to Ramus, SVG to OM3 and Endoscopic vein harvest from the right leg on 03/27/2024 with Dr. Lucas.  The patient's early postoperative recovery while in the hospital was notable for restarting eliquis  for history of paroxysmal atrial fibrillation. He was started on amiodarone . He was routinely diuresed.  He was restarted on coreg . He was stable for discharge home on  04/01/2024.  Since hospital discharge the patient reports that he has been doing well.  He has been using tylenol  as needed for shoulder and back pain.  He does not report much incisional pain.  He has been doing interval walking and walks around 8 minutes each time.  He has had elevated readings of blood pressure and restarted his amlodipine  10 mg and valsartan 320 mg.  His readings without blood pressure mediations were 160s/90s.  Since restarting home readings are now 120s-130s/80s.  He has lower leg edema which just started after restarting amlodipine .  This happened previously when using this medication. His weight has stay stable at home.  His home weight is 186 lbs. He denies chest pain, chest tightness, palpitations and shortness of breath.    Allergies as of 04/18/2024       Reactions   Codeine Itching   Quinolones Other (See Comments)   Ascending thoracic aortic aneurysm    Sulfa Antibiotics Other (See Comments)   Reactions: nausea, shaking of the body   Wheat Rash        Medication List        Accurate as of April 18, 2024 11:59 PM. If you have any questions, ask your nurse or doctor.          acetaminophen  500 MG tablet Commonly known as: TYLENOL  Take 1,000 mg by mouth daily as needed for moderate pain (pain score 4-6) or mild pain (pain score  1-3).   allopurinol  300 MG tablet Commonly known as: ZYLOPRIM  Take 300 mg by mouth daily.   amiodarone  400 MG tablet Commonly known as: PACERONE  Take 1 tablet (400 mg total) by mouth 2 (two) times daily. For 5 days then take 400 mg once daily   amLODipine  10 MG tablet Commonly known as: NORVASC  Take 10 mg by mouth daily.   aspirin  EC 81 MG tablet Take 1 tablet (81 mg total) by mouth daily. Swallow whole.   atorvastatin  10 MG tablet Commonly known as: LIPITOR TAKE 1 TABLET BY MOUTH EVERY DAY   carvedilol  6.25 MG tablet Commonly known as: COREG  Take 1 tablet (6.25 mg total) by mouth 2 (two) times daily.   clobetasol cream 0.05 % Commonly known as: TEMOVATE Apply 1 Application topically daily as needed (irritation).   Eliquis  5 MG Tabs tablet Generic drug: apixaban  TAKE 1 TABLET TWICE A DAY   Farxiga 5 MG Tabs tablet Generic drug: dapagliflozin propanediol Take 5 mg by mouth at bedtime.   FISH OIL PO Take 1 capsule by mouth daily.   furosemide  40 MG tablet Commonly known as: LASIX  Take 1 tablet (40 mg total) by mouth daily.   ketoconazole 2 % cream Commonly known as: NIZORAL Apply 1 Application topically daily as needed for irritation.  One A Day Men 50 Plus Tabs Take 1 tablet by mouth 4 (four) times a week.   potassium chloride  SA 20 MEQ tablet Commonly known as: KLOR-CON  M Take 1 tablet (20 mEq total) by mouth daily.   valsartan 320 MG tablet Commonly known as: DIOVAN Take 320 mg by mouth daily.   VITAMIN C PO Take 1 tablet by mouth 4 (four) times a week.         ROS Review of Systems  Constitutional:  Negative for fever and malaise/fatigue.  Respiratory:  Negative for cough, shortness of breath and wheezing.   Cardiovascular:  Positive for leg swelling. Negative for chest pain and palpitations.  Neurological:  Negative for dizziness and headaches.      BP 132/78 (BP Location: Left Arm)   Pulse (!) 58   Resp 18   Ht 5' 9 (1.753 m)   Wt  194 lb (88 kg)   SpO2 97%   BMI 28.65 kg/m    Physical Exam Constitutional:      Appearance: Normal appearance.  HENT:     Head: Normocephalic and atraumatic.  Cardiovascular:     Rate and Rhythm: Normal rate and regular rhythm.     Heart sounds: Normal heart sounds, S1 normal and S2 normal.  Pulmonary:     Effort: Pulmonary effort is normal.     Breath sounds: Normal breath sounds.  Musculoskeletal:     Right lower leg: 1+ Edema present.     Left lower leg: 1+ Edema present.  Skin:    General: Skin is warm and dry.     Comments: Incisions sites healing without erythema or drainage  Neurological:     General: No focal deficit present.     Mental Status: He is alert and oriented to person, place, and time.      Imaging: EXAM: 2 VIEW(S) XRAY OF THE CHEST 04/18/2024 01:18:28 PM   COMPARISON: 03/29/2024   CLINICAL HISTORY: History of coronary artery bypass grafting (CABG).   FINDINGS:   LUNGS AND PLEURA: Mild pulmonary edema. Patchy opacity at left lung base. Small left pleural effusion. Trace right pleural effusion. No pneumothorax.   HEART AND MEDIASTINUM: Cardiomegaly, stable. CABG markers noted. Median sternotomy wires noted.   BONES AND SOFT TISSUES: Median sternotomy wires noted. No acute osseous abnormality.   IMPRESSION: 1. Mild pulmonary edema with small left and trace right pleural effusions. 2. Patchy left basilar opacity, favored atelectasis; pneumonia or aspiration could have a similar appearance, with follow-up chest radiograph in 6-8 weeks to document resolution if clinically suspected.   Electronically signed by: Waddell Calk MD 04/18/2024 02:09 PM EST RP Workstation: [id number]   Assessment/Plan:  S/P CABG x 3 -We reviewed today's chest xray which showed small left and trace right pleural effusions.  He is currently asymptomatic at this time. Discussed alarm symptoms and he should continue to use his incentive spirometer -Discussed  continuation of sternal precautions until a full 6 weeks from surgery. He should not lift over 10 pounds until a full 3 months from surgery -He should continue to increase his activity as tolerated -Resent lasix  40 mg and potassium 20 meq for 5 days at this time due to lower leg edema and pleural effusions. Discussed that lower leg edema could be side effect from amlodipine .  He should continue with daily weights.  If he has weight gain of 3 pounds in one day or 5 pounds in one week he is to let our clinic know -He should  continue with amlodipine  and valsartan.  He has reached out to his cardiologist letting them know he restarted his medications.  He should continue with blood pressure log and monitor  -Follow up in 2 weeks with chest xray    Manuelita CHRISTELLA Rough, PA-C 10:47 AM 04/19/24  "

## 2024-04-19 NOTE — Patient Instructions (Signed)
-  Follow up in 2 weeks with chest xray  You may continue to gradually increase your physical activity as tolerated.  Refrain from any heavy lifting or strenuous use of your arms and shoulders until at least 6 weeks from the time of your surgery, and avoid activities that cause increased pain in your chest on the side of your surgical incision.  Otherwise you may continue to increase activities without any particular limitations.  Increase the intensity and duration of physical activity gradually.

## 2024-04-23 ENCOUNTER — Other Ambulatory Visit: Payer: Self-pay | Admitting: Surgical

## 2024-04-25 ENCOUNTER — Encounter: Payer: Self-pay | Admitting: Cardiology

## 2024-05-01 ENCOUNTER — Other Ambulatory Visit: Payer: Self-pay

## 2024-05-01 DIAGNOSIS — Z951 Presence of aortocoronary bypass graft: Secondary | ICD-10-CM

## 2024-05-02 ENCOUNTER — Ambulatory Visit (HOSPITAL_COMMUNITY)
Admission: RE | Admit: 2024-05-02 | Discharge: 2024-05-02 | Disposition: A | Discharge status: Home / Self Care | Source: Ambulatory Visit | Attending: Cardiovascular Disease | Admitting: Cardiovascular Disease

## 2024-05-02 ENCOUNTER — Ambulatory Visit: Payer: Self-pay

## 2024-05-02 VITALS — BP 159/81 | HR 60 | Resp 18 | Ht 69.0 in | Wt 194.0 lb

## 2024-05-02 DIAGNOSIS — Z951 Presence of aortocoronary bypass graft: Secondary | ICD-10-CM | POA: Insufficient documentation

## 2024-05-02 MED ORDER — POTASSIUM CHLORIDE CRYS ER 20 MEQ PO TBCR: 20.0000 meq | EXTENDED_RELEASE_TABLET | Freq: Every day | ORAL | 0 refills | Status: AC

## 2024-05-02 MED ORDER — FUROSEMIDE 40 MG PO TABS: 40.0000 mg | ORAL_TABLET | Freq: Every day | ORAL | 0 refills | Status: AC

## 2024-05-02 NOTE — Progress Notes (Signed)
 "     [address] [place] [address]             [phone]       HPI:  Patient returns for routine postoperative follow-up having undergone  Coronary artery bypass grafting x 3, Left internal mammary artery graft to the LAD, SVG to Ramus, SVG to OM3 and Endoscopic vein harvest from the right leg on 03/27/2024 with Dr. Lucas.  The patient's early postoperative recovery while in the hospital was notable for restarting eliquis  for history of paroxysmal atrial fibrillation. He was started on amiodarone . He was routinely diuresed.  He was restarted on coreg . He was stable for discharge home on  04/01/2024.  He presents today for continued follow up and reports that he has been doing well.  He has not needed any medications for pain. He has been active with walking around stores and around his house. His incision sites have been healing appropriately.  His home blood pressure readings are stable at 120s-130s/80s. He continues to notice slight lower leg edema. Denies chest pain, shortness of breath and palpitations.    Allergies as of 05/02/2024       Reactions   Codeine Itching   Quinolones Other (See Comments)   Ascending thoracic aortic aneurysm    Sulfa Antibiotics Other (See Comments)   Reactions: nausea, shaking of the body   Wheat Rash        Medication List        Accurate as of May 02, 2024 10:39 AM. If you have any questions, ask your nurse or doctor.          STOP taking these medications    furosemide  40 MG tablet Commonly known as: LASIX    potassium chloride  SA 20 MEQ tablet Commonly known as: KLOR-CON  M       TAKE these medications    acetaminophen  500 MG tablet Commonly known as: TYLENOL  Take 1,000 mg by mouth daily as needed for moderate pain (pain score 4-6) or mild pain (pain score 1-3).   allopurinol  300 MG tablet Commonly known as: ZYLOPRIM  Take 300 mg by mouth daily.   amiodarone  400 MG tablet Commonly known  as: PACERONE  Take 1 tablet (400 mg total) by mouth 2 (two) times daily. For 5 days then take 400 mg once daily   amLODipine  10 MG tablet Commonly known as: NORVASC  Take 10 mg by mouth daily.   aspirin  EC 81 MG tablet Take 1 tablet (81 mg total) by mouth daily. Swallow whole.   atorvastatin  10 MG tablet Commonly known as: LIPITOR TAKE 1 TABLET BY MOUTH EVERY DAY   carvedilol  6.25 MG tablet Commonly known as: COREG  Take 1 tablet (6.25 mg total) by mouth 2 (two) times daily.   clobetasol cream 0.05 % Commonly known as: TEMOVATE Apply 1 Application topically daily as needed (irritation).   Eliquis  5 MG Tabs tablet Generic drug: apixaban  TAKE 1 TABLET TWICE A DAY   Farxiga 5 MG Tabs tablet Generic drug: dapagliflozin propanediol Take 5 mg by mouth at bedtime.   FISH OIL PO Take 1 capsule by mouth daily.   ketoconazole 2 % cream Commonly known as: NIZORAL Apply 1 Application topically daily as needed for irritation.   One A Day Men 50 Plus Tabs Take 1 tablet by mouth 4 (four) times a week.   valsartan 320 MG tablet Commonly known as: DIOVAN Take 320 mg by mouth daily.   VITAMIN C PO  Take 1 tablet by mouth 4 (four) times a week.         ROS Review of Systems  Constitutional:  Negative for fever and malaise/fatigue.  Respiratory:  Negative for cough, shortness of breath and wheezing.   Cardiovascular:  Positive for leg swelling. Negative for chest pain and palpitations.  Neurological:  Negative for dizziness and headaches.      BP (!) 159/81 (BP Location: Right Arm)   Pulse 60   Resp 18   Ht 5' 9 (1.753 m)   Wt 194 lb (88 kg)   SpO2 96%   BMI 28.65 kg/m    Physical Exam Constitutional:      Appearance: Normal appearance.  HENT:     Head: Normocephalic and atraumatic.  Cardiovascular:     Rate and Rhythm: Normal rate and regular rhythm.     Heart sounds: Normal heart sounds, S1 normal and S2 normal.  Pulmonary:     Effort: Pulmonary effort is  normal.     Breath sounds: Normal breath sounds.  Musculoskeletal:     Right lower leg: 1+ Edema present.     Left lower leg: 1+ Edema present.  Skin:    General: Skin is warm and dry.     Comments: Incisions sites healed without erythema  Neurological:     General: No focal deficit present.     Mental Status: He is alert and oriented to person, place, and time.      Imaging: EXAM: 2 VIEW(S) XRAY OF THE CHEST 05/02/2024 09:31:50 AM   COMPARISON: 04/18/2024   CLINICAL HISTORY: Coronary artery bypass graft.   FINDINGS:   LUNGS AND PLEURA: Hazy airspace opacity in left lung base. Moderate left pleural effusion, unchanged. Mild subluxation of blood flow may reflect pulmonary venous hypertension. No overt edema. No pneumothorax.   HEART AND MEDIASTINUM: Cardiomegaly, stable. No acute abnormality of the mediastinal silhouette.   BONES AND SOFT TISSUES: No acute osseous abnormality.   IMPRESSION: 1. Moderate left pleural effusion, unchanged, with findings of pulmonary venous hypertension and no overt pulmonary edema. 2. Hazy airspace opacity in the left lung base. 3. Stable cardiomegaly.   Electronically signed by: Ryan Salvage MD 05/02/2024 09:57 AM EST RP Workstation: [id number]   Assessment/Plan:  S/P CABG x 3 -We reviewed today's chest xray which showed stable left sided pleural effusion.  He is currently asymptomatic at this time. Discussed alarm symptoms and he should continue to use his incentive spirometer -Discussed continuation of sternal precautions until a full 6 weeks from surgery. He should not lift over 10 pounds until a full 3 months from surgery -He should continue to increase his activity as tolerated -Resent lasix  40 mg and potassium 20 meq for 14 days at this time due to lower leg edema and pleural effusions.  He should continue with daily weights.  Home weight has stayed stable around 188 lbs. If he has weight gain of 3 pounds in one day or 5  pounds in one week he is to let our clinic know -He is cleared to drive, first time driving should be a short distance in the day time and he can increase from there -He is cleared to participation in cardiac rehab at this time -Follow up in 3 weeks with chest xray    Manuelita CHRISTELLA Rough, PA-C 10:39 AM 05/02/24  "

## 2024-05-02 NOTE — Patient Instructions (Signed)
-  Follow up in 3 weeks with chest xray  You may return to driving an automobile as long as you are no longer requiring oral narcotic pain relievers during the daytime.  It would be wise to start driving only short distances during the daylight and gradually increase from there as you feel comfortable.   You may continue to gradually increase your physical activity as tolerated.  Refrain from any heavy lifting or strenuous use of your arms and shoulders until at least 6 weeks from the time of your surgery, and avoid activities that cause increased pain in your chest on the side of your surgical incision.  Otherwise you may continue to increase activities without any particular limitations.  Increase the intensity and duration of physical activity gradually.

## 2024-05-09 ENCOUNTER — Ambulatory Visit: Admitting: Cardiology

## 2024-05-09 ENCOUNTER — Other Ambulatory Visit: Payer: Self-pay | Admitting: Internal Medicine

## 2024-05-23 ENCOUNTER — Ambulatory Visit

## 2024-08-20 ENCOUNTER — Inpatient Hospital Stay

## 2024-08-21 ENCOUNTER — Inpatient Hospital Stay: Admitting: Oncology
